# Patient Record
Sex: Male | Born: 1955 | Race: White | Hispanic: No | Marital: Married | Smoking: Former smoker
Health system: Southern US, Community
[De-identification: ages and names within clinical notes are randomized; demographics above are authoritative.]

## PROBLEM LIST (undated history)

## (undated) DIAGNOSIS — R569 Unspecified convulsions: Secondary | ICD-10-CM

## (undated) DIAGNOSIS — E1169 Type 2 diabetes mellitus with other specified complication: Secondary | ICD-10-CM

## (undated) DIAGNOSIS — I639 Cerebral infarction, unspecified: Secondary | ICD-10-CM

## (undated) DIAGNOSIS — C44300 Unspecified malignant neoplasm of skin of unspecified part of face: Secondary | ICD-10-CM

## (undated) DIAGNOSIS — K219 Gastro-esophageal reflux disease without esophagitis: Secondary | ICD-10-CM

## (undated) DIAGNOSIS — I152 Hypertension secondary to endocrine disorders: Secondary | ICD-10-CM

## (undated) DIAGNOSIS — E1159 Type 2 diabetes mellitus with other circulatory complications: Secondary | ICD-10-CM

## (undated) DIAGNOSIS — M952 Other acquired deformity of head: Secondary | ICD-10-CM

## (undated) DIAGNOSIS — N183 Chronic kidney disease, stage 3 unspecified: Secondary | ICD-10-CM

## (undated) DIAGNOSIS — K449 Diaphragmatic hernia without obstruction or gangrene: Secondary | ICD-10-CM

## (undated) DIAGNOSIS — Z9889 Other specified postprocedural states: Secondary | ICD-10-CM

## (undated) DIAGNOSIS — C61 Malignant neoplasm of prostate: Secondary | ICD-10-CM

## (undated) DIAGNOSIS — E119 Type 2 diabetes mellitus without complications: Secondary | ICD-10-CM

## (undated) HISTORY — PX: COLONOSCOPY: SHX174

## (undated) HISTORY — PX: HEMORRHOID SURGERY: SHX153

## (undated) HISTORY — DX: Unspecified convulsions: R56.9

## (undated) HISTORY — PX: SKIN GRAFT: SHX250

## (undated) HISTORY — PX: PROSTATE BIOPSY: SHX241

## (undated) HISTORY — DX: Cerebral infarction, unspecified: I63.9

---

## 1998-04-11 ENCOUNTER — Ambulatory Visit (HOSPITAL_COMMUNITY): Admission: RE | Admit: 1998-04-11 | Discharge: 1998-04-11 | Payer: Self-pay | Admitting: General Surgery

## 2002-08-08 HISTORY — PX: ESOPHAGOGASTRODUODENOSCOPY (EGD) WITH ESOPHAGEAL DILATION: SHX5812

## 2002-08-10 ENCOUNTER — Ambulatory Visit (HOSPITAL_COMMUNITY): Admission: RE | Admit: 2002-08-10 | Discharge: 2002-08-10 | Payer: Self-pay | Admitting: Internal Medicine

## 2003-11-04 ENCOUNTER — Other Ambulatory Visit: Admission: RE | Admit: 2003-11-04 | Discharge: 2003-11-04 | Payer: Self-pay | Admitting: Dermatology

## 2007-04-10 ENCOUNTER — Emergency Department (HOSPITAL_COMMUNITY): Admission: EM | Admit: 2007-04-10 | Discharge: 2007-04-11 | Payer: Self-pay | Admitting: Emergency Medicine

## 2007-04-10 IMAGING — CR DG CHEST 1V PORT
1 series · 1 of 1 positions shown · non-contrast
Comparison: None

CLINICAL DATA: Chest pain.

CHEST - 1 VIEW

[AP]
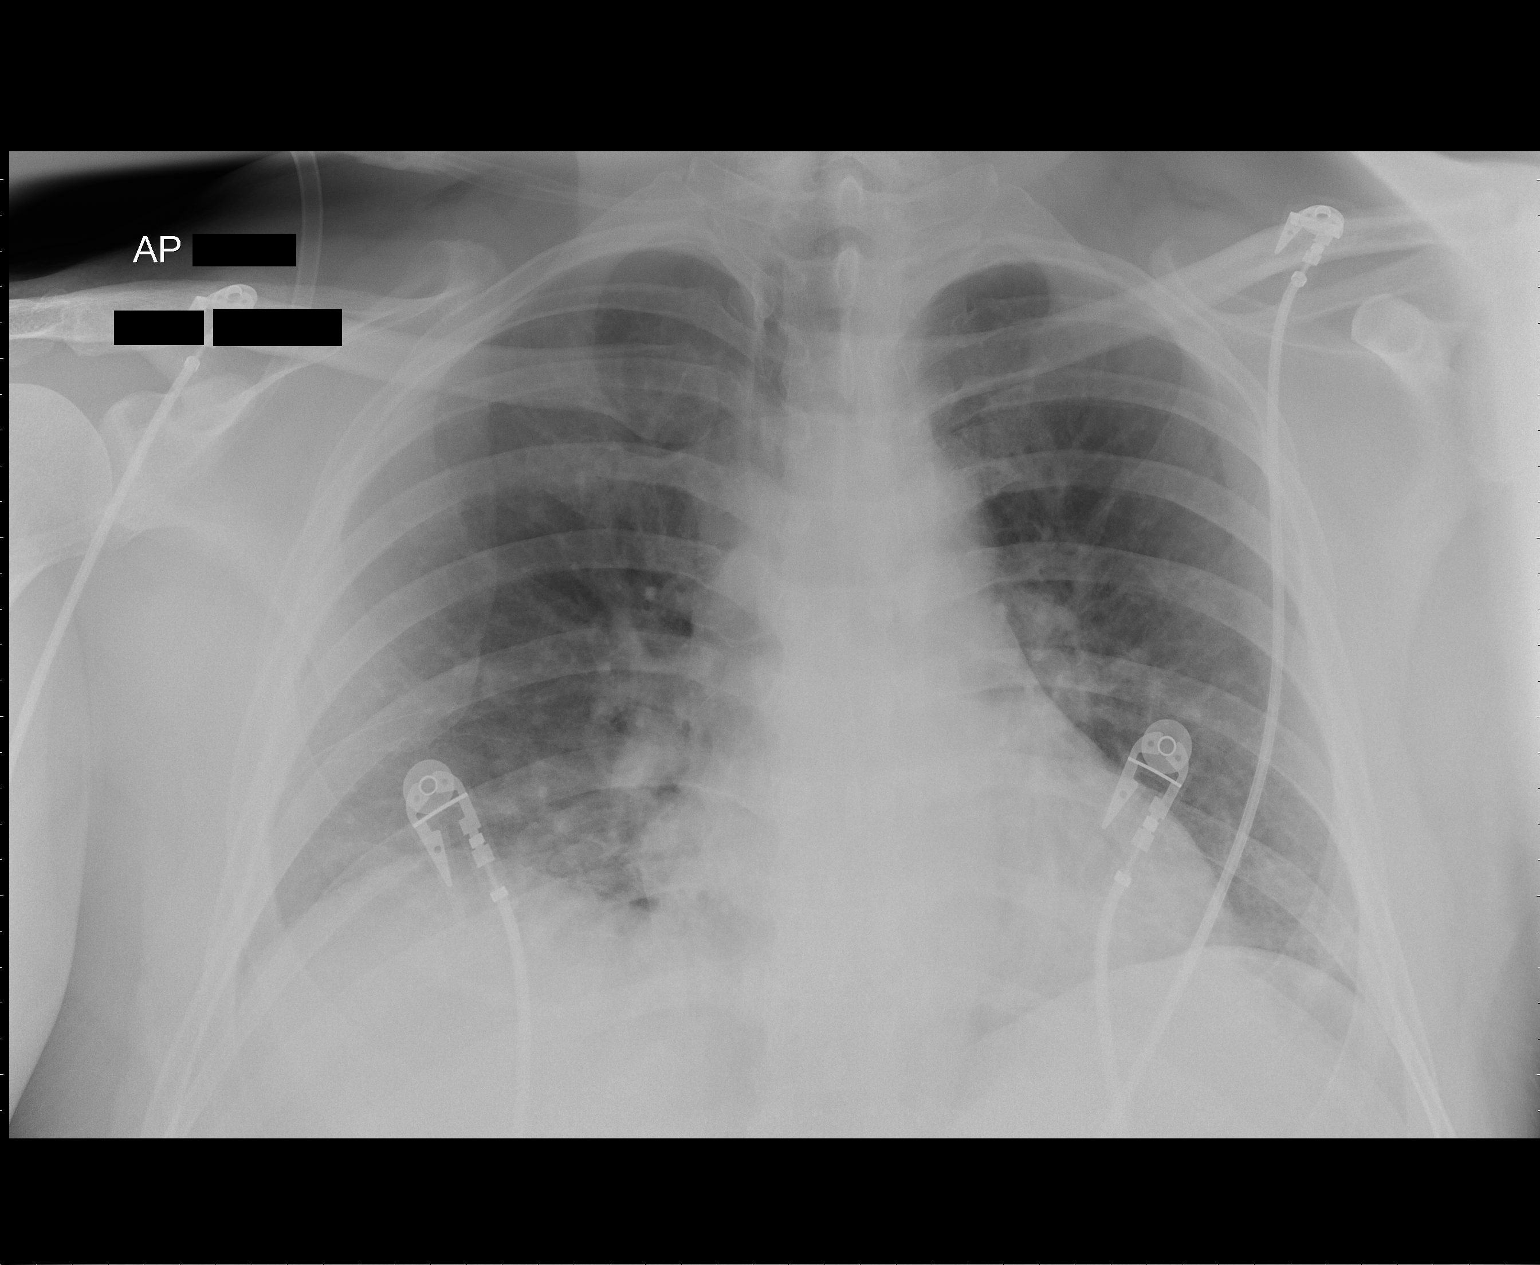

[1 of 1 positions shown; findings below may reference images not displayed]

FINDINGS: Midline trachea. Normal heart size and mediastinal contours. No
pleural effusion or pneumothorax. Lung volumes are diminished. This results in
pulmonary interstitial prominence. Patchy right greater than left basilar air
space opacities are likely atelectasis. No free intraperitoneal air.

IMPRESSION

1. Low lung volumes with patchy bibasilar opacities, likely atelectasis.

## 2007-04-11 IMAGING — US US ABDOMEN COMPLETE
1 series · 14 of 25 positions shown · non-contrast
Comparison: None

CLINICAL DATA: Abdominal pain, chest pain

ABDOMEN ULTRASOUND
TECHNIQUE: Complete abdominal ultrasound examination was performed including
evaluation of the liver, gallbladder, bile ducts, pancreas, kidneys, spleen,
IVC, and abdominal aorta.

[Series 1: unknown · 0.33mm/px · 14 of 54 slices shown]
[im 1/54]
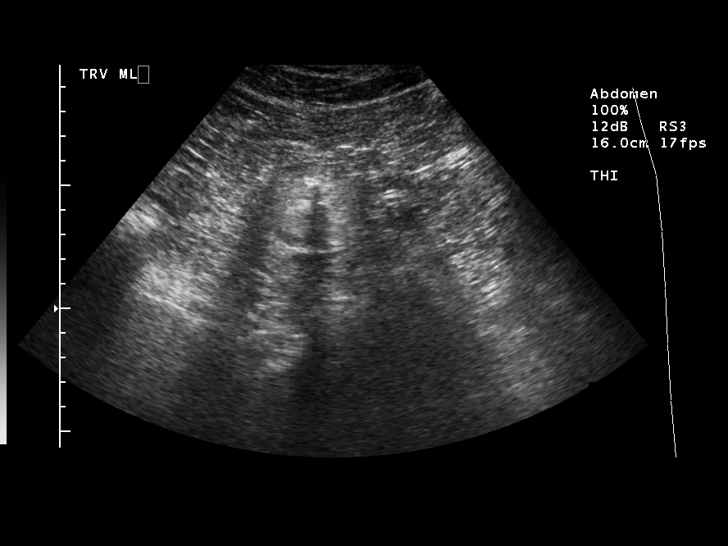
[im 5/54]
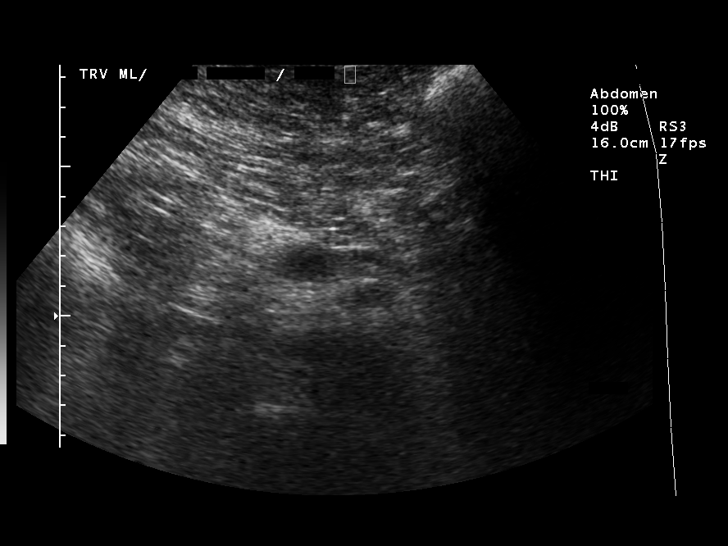
[im 9/54]
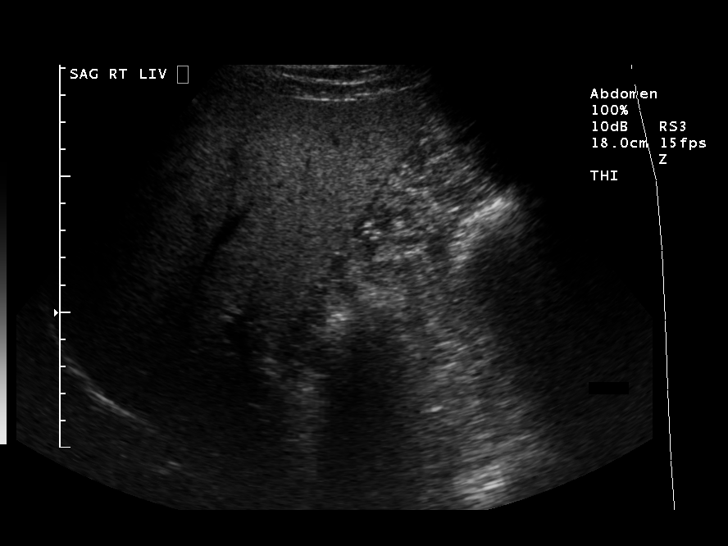
[im 14/54]
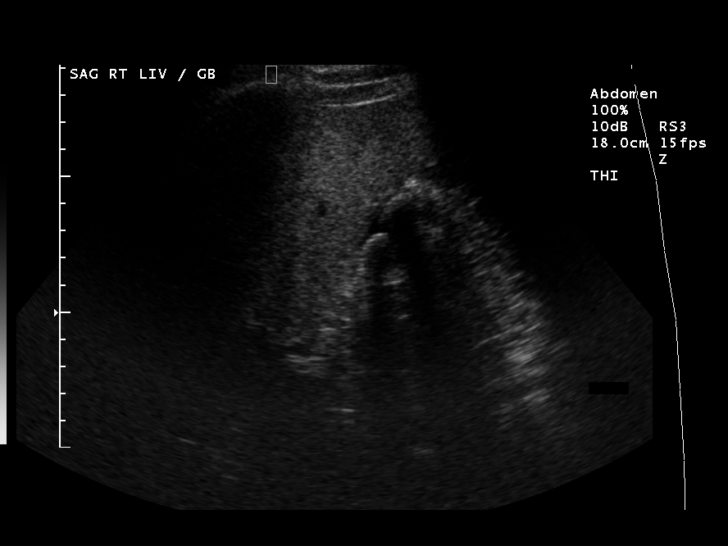
[im 18/54]
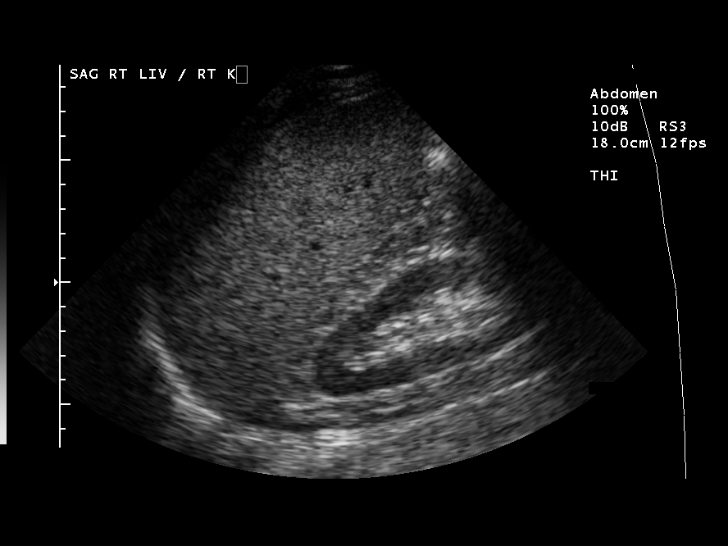
[im 20/54]
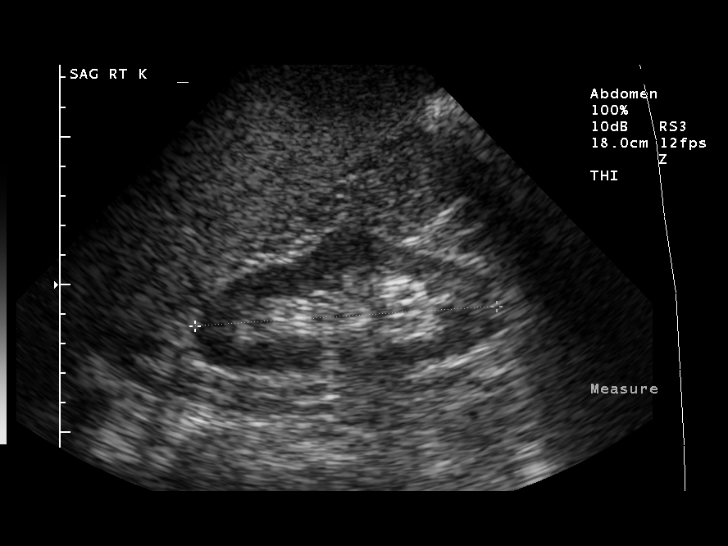
[im 25/54]
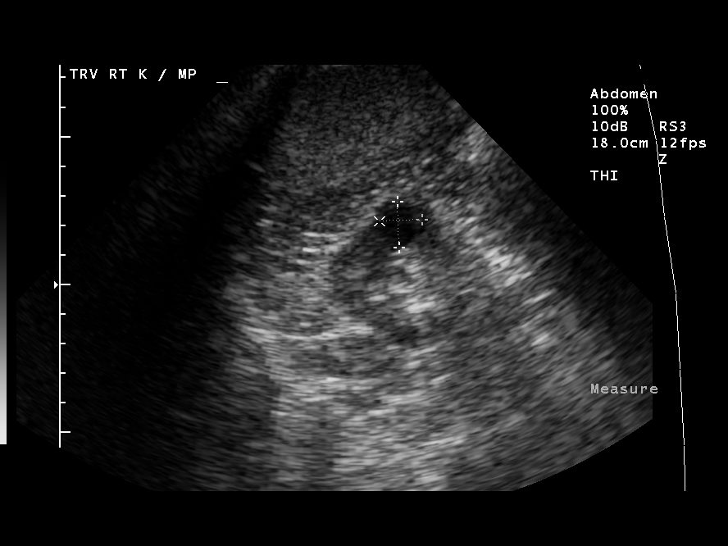
[im 29/54]
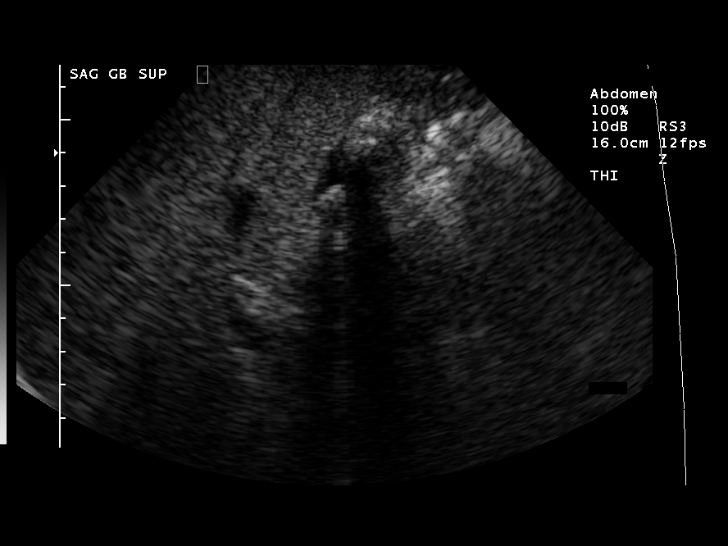
[im 34/54]
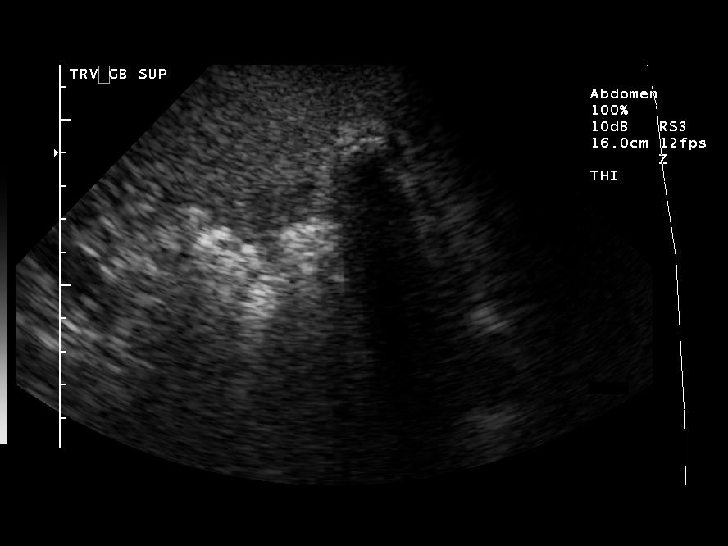
[im 36/54]
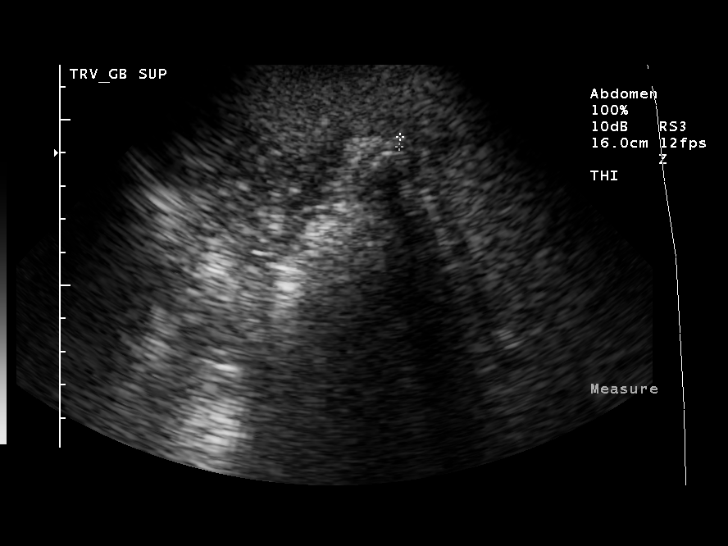
[im 40/54]
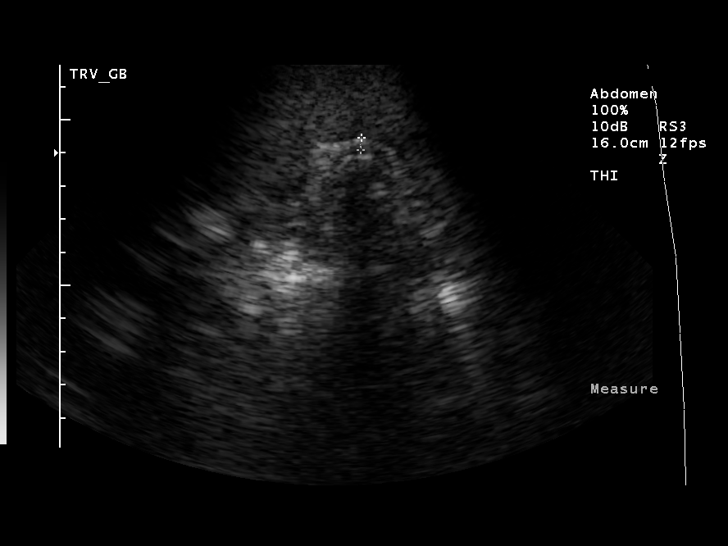
[im 45/54]
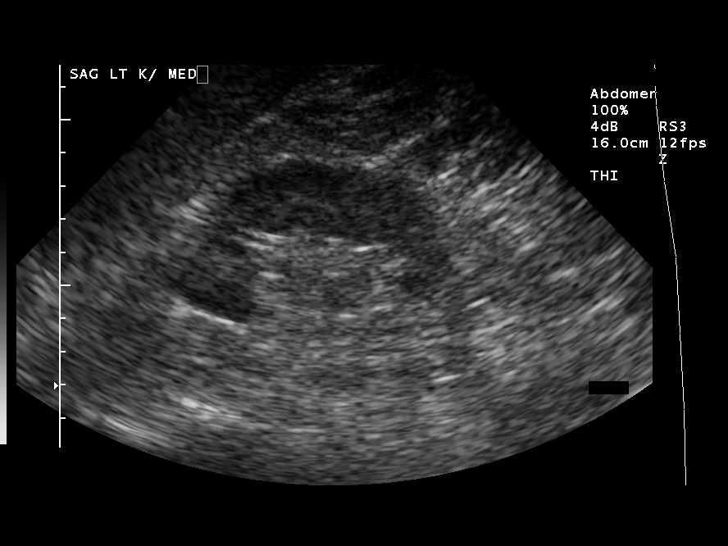
[im 49/54]
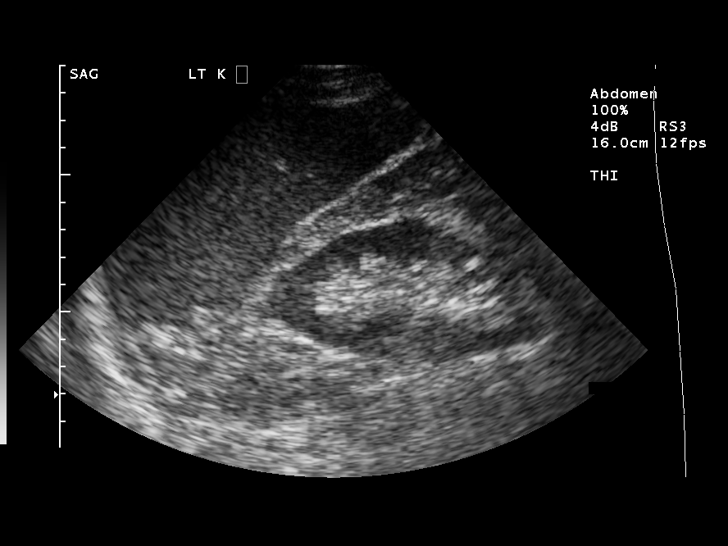
[im 54/54]
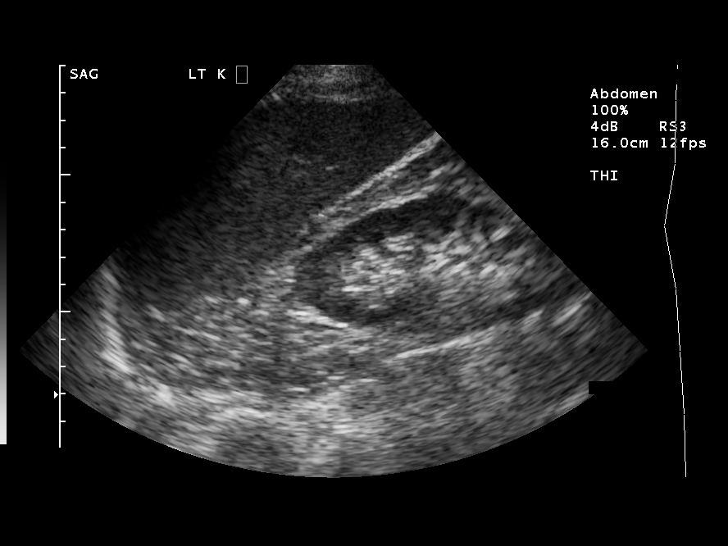

[14 of 25 positions shown; findings below may reference images not displayed]

FINDINGS: Gallbladder is contracted, containing multiple stones, measuring up to
1.3 cm. No sonographic Murphy's sign. Common bile duct normal at 4-5 mm.

Mild fatty infiltration of the liver noted. No focal lesions in the liver,
spleen, or left kidney. Visualized pancreas unremarkable although not well
visualized due to overlying bowel gas. Visualized IVC and abdominal aorta
unremarkable. Small benign appearing cyst in the midpole the right kidney.

IMPRESSION

Gallstones with contracted gallbladder.

Mild fatty infiltration of the liver.

## 2008-04-09 HISTORY — PX: LAPAROSCOPIC CHOLECYSTECTOMY: SUR755

## 2009-04-09 DIAGNOSIS — Z9889 Other specified postprocedural states: Secondary | ICD-10-CM

## 2009-04-09 DIAGNOSIS — M952 Other acquired deformity of head: Secondary | ICD-10-CM

## 2009-04-09 DIAGNOSIS — C44300 Unspecified malignant neoplasm of skin of unspecified part of face: Secondary | ICD-10-CM

## 2009-04-09 HISTORY — DX: Unspecified malignant neoplasm of skin of unspecified part of face: C44.300

## 2009-04-09 HISTORY — DX: Other specified postprocedural states: Z98.890

## 2009-04-09 HISTORY — DX: Other acquired deformity of head: M95.2

## 2010-08-25 NOTE — Consult Note (Signed)
NAME:  [name] NO.:  [id number]   MEDICAL RECORD NO.:  [id number]                  PATIENT TYPE:   LOCATION:                                       FACILITY:   PHYSICIAN:  Lionel December, M.D.                 DATE OF BIRTH:  March 29, 1956   DATE OF CONSULTATION:  07/24/2002  DATE OF DISCHARGE:                                   CONSULTATION   REASON FOR CONSULTATION:  Gastroesophageal reflux disease, needs EGD.   HISTORY OF PRESENT ILLNESS:  The patient is a 55 year old Caucasian  gentleman, patient of Dr. Dewaine Conger, who presents today for further evaluation  of chronic gastroesophageal reflux disease, which is poorly controlled.  Patient states that he has had acid reflux for more than 14 years.  He has  been on chronic TPI therapy at least for two years; however, over the last  one and one-half years, his acid reflux has been more and more difficult to  treat.  He says he has also gained more than 25 pounds in the last two  years.  He had stopped smoking for over seven years and then started back  within the last two years as well.  He complains of typical heartburn  symptoms.  He also feels like his food is sticking in his esophagus.  He is  having heartburn/indigestion, which awakens him at night.  At times, he has  pain in his esophageal region.  He complains of epigastric pain and  swelling.  Denies any melena, rectal bleeding, nausea, or vomiting.  He has  acid regurgitation.  His bowel movements vary; he will have anywhere from  one to two bowel movements daily or have four to five loose stools daily.  He has difficulty with postprandial loose stools, particularly when he has  bad indigestion.  Sometimes, he has fecal urgency.   CURRENT MEDICATIONS:  1. Prevacid 30 mg b.i.d.  2. Claritin daily.  3. Lexapro 6 mg daily.   ALLERGIES:  CODEINE, CIPRO, and Z-PAK.   PAST MEDICAL HISTORY:  1. Chronic gastroesophageal reflux disease, has never  had EGD.  2. Seasonal allergies.  3. Depression.  4. History of colonic polyps, diagnosed in 2000.  Patient is unsure when he     is due for another colonoscopy.  He had a hemorrhoid surgery at that time     as well for rectal bleeding.   FAMILY HISTORY:  Father has skin cancer; he is a diabetic and has heart  disease.  Mother has some type of lung disease.  He has a paternal uncle who  had throat cancer and one who died of metastatic cancer, primary unknown.  Denies any family history of colorectal cancer.   SOCIAL HISTORY:  He has been married for 29 years, has three children; two  of his sons have gastroesophageal reflux disease.  He is employed with  [employer].  He smokes 10 cigarettes daily.  He has been smoking  again for two years but had quit for about seven years previously;  previously, he was a heavy smoker at two packs per day.  Denies any alcohol  use.   REVIEW OF SYSTEMS:  GI:  Please see HPI for GI.  GENERAL AND  CARDIOPULMONARY:  He denies any shortness of breath, dyspnea on exertion, or  palpitations.   PHYSICAL EXAMINATION:  VITAL SIGNS:  Weight 201, height 5 feet 8 inches,  temp 97.8, blood pressure 120/90, pulse 78.  GENERAL:  A pleasant, well-nourished, well-developed Caucasian male in no  acute distress.  SKIN:  Warm and dry.  No jaundice.  HEENT:  Conjunctivae are pink.  Sclerae are not icteric.  Oropharyngeal  mucosa moist and pink; no lesions, erythema, or exudate.  No  lymphadenopathy, thyromegaly.  CHEST:  Lungs are clear to auscultation.  CARDIAC EXAM:  Reveals regular rate and rhythm.  Normal S1 and S2.  No  murmurs, rubs, or gallops.  ABDOMEN:  Positive bowel sounds.  Soft, nondistended.  He has mild  epigastric tenderness to palpation.  No organomegaly or masses.  EXTREMITIES:  No edema.   IMPRESSION:  The patient is a 55 year old gentleman with gastroesophageal  reflux disease for more than 14 years.  He has been having difficulty   controlling his symptoms over the last couple of years, which may be in part  due to his weight gain as well as his tobacco cigarette use.  His symptoms  are typical of gastroesophageal reflux disease.  Given that he has had  chronic gastroesophageal reflux disease and is having poorly controlled  symptoms, I do recommend esophagogastroduodenoscopy at this time.  In  addition, he complains of dysphagia.  He may have an esophageal stricture.  Epigastric pain may be due to gastroesophageal reflux disease.  Complains of  postprandial loose stools; I suspect that he may have irritable bowel  syndrome.   PLAN:  1. EGD and possible dilatation in the near future.  2. Stop Prevacid, begin Aciphex 20 mg p.o. b.i.d., number three weeks of     samples provided.  3. Discussed antireflux measures and pamphlet provided.  4. Encouraged him to take Fiber Choice two tablets daily and/or Imodium 2 mg     as needed for loose stools.  At this time, would like to avoid     antispasmodics, as this may make his reflux worse.   I would like to thank Dr. Dewaine Conger for allowing [place] to take part in the care of  this patient.     Tana Coast, P.A.                        Lionel December, M.D.    LL/MEDQ  D:  07/24/2002  T:  07/24/2002  Job:  [id number]   cc:   Colon Flattery, MD  [address]  [place]  [place] [address]  Fax: [phone]   Tommye Standard, M.D.  [address] - Resident  [place], [place] [address]

## 2010-08-25 NOTE — Op Note (Signed)
NAME:  [name], [name]                           ACCOUNT NO.:  [id number]   MEDICAL RECORD NO.:  [id number]                   PATIENT TYPE:  AMB   LOCATION:  DAY                                  FACILITY:  [facility]   PHYSICIAN:  Lionel December, M.D.                 DATE OF BIRTH:  02-19-1956   DATE OF PROCEDURE:  08/10/2002  DATE OF DISCHARGE:                                 OPERATIVE REPORT   PROCEDURE:  Esophagogastroduodenoscopy with esophageal dilatation.   INDICATIONS:  The patient is a 55 year old Caucasian male with a 14-year  history of symptoms of heartburn, who has been on a PPI with poor control of  his symptoms.  However, he may not be very compliant with dietary measures.  He was switched from Prevacid to Aciphex 20 mg b.i.d. and symptomatically  feels better.  He is undergoing a diagnostic and therapeutic procedure.  The  procedure was reviewed with the patient and informed consent was obtained.   PREMEDICATION:  Cetacaine spray for pharyngeal topical anesthesia, Demerol  50 mg IV, Versed 9 mg IV in divided dose.   INSTRUMENT USED:  Olympus video system.   FINDINGS:  Procedure performed in endoscopy suite.  The patient's vital  signs and O2 saturation were monitored during procedure and remained stable.  The patient was placed in the left lateral recumbent position and the  endoscope was passed via oropharynx without any difficulty into esophagus.   Esophagus:  Mucosa of the esophagus normal throughout.  The squamocolumnar  junction is somewhat irregular.  There was inconspicuous incomplete ring.  There was a small sliding hiatal hernia, no more than 3 or 4 cm in length.  The diaphragmatic hiatus was wide open.   Stomach:  It was empty and distended very well with insufflation.  Folds of  proximal stomach were normal.  Examination of the mucosa revealed patchy  erythema and granularity at the body and antrum.  The pyloric channel was  patent.  Angularis, fundus, and  cardia examined by retroflexing the scope  and were normal.   Duodenum:  Examination of the bulb and postbulbar duodenum was normal.  The  endoscope was withdrawn.  The esophagus was dilated by passing a 56 [place]  Maloney dilator through esophagus completely.  After dilatation was  complete, endoscope was passed and there was no mucosal injury noted to the  esophagus.  The patient tolerated the procedure well.   FINAL DIAGNOSES:  1. No evidence of Barrett's esophagus or erosive esophagitis.  Small sliding     hiatal hernia.  2. Incomplete/inconspicuous ring.  Esophagus was dilated by passing 56     [place] Maloney dilator.  3. Nonerosive gastritis.   RECOMMENDATIONS:  1. Antireflux measures stressed with the patient.  2. Would like for him to stay on Aciphex 20 mg p.o. b.i.d. for eight more     weeks and thereafter back to  20 mg p.o. q.a.m.  3. H. pylori serology will be checked.  4. He will return for OV in two months from now.  5. Unless his symptom control is felt to be excellent, he may be a candidate     for antireflux surgery.                                               Lionel December, M.D.    NR/MEDQ  D:  08/10/2002  T:  08/10/2002  Job:  [id number]   cc:   Colon Flattery, MD  [address]  [place]  [place] [address]  Fax: [phone]

## 2010-12-28 LAB — DIFFERENTIAL
Basophils Absolute: 0
Basophils Relative: 0
Eosinophils Absolute: 0.1
Eosinophils Relative: 1
Lymphocytes Relative: 30
Lymphs Abs: 2.2
Monocytes Absolute: 0.7
Monocytes Relative: 10
Neutro Abs: 4.3
Neutrophils Relative %: 58

## 2010-12-28 LAB — URINALYSIS, ROUTINE W REFLEX MICROSCOPIC
Bilirubin Urine: NEGATIVE
Glucose, UA: NEGATIVE
Hgb urine dipstick: NEGATIVE
Ketones, ur: NEGATIVE
Nitrite: NEGATIVE
Protein, ur: NEGATIVE
Specific Gravity, Urine: 1.026
Urobilinogen, UA: 0.2
pH: 6

## 2010-12-28 LAB — COMPREHENSIVE METABOLIC PANEL
ALT: 32
Alkaline Phosphatase: 71
CO2: 25
GFR calc non Af Amer: >60
Glucose, Bld: 171 — ABNORMAL HIGH
Potassium: 3.6
Sodium: 136

## 2010-12-28 LAB — LIPASE, BLOOD: Lipase: 29

## 2010-12-28 LAB — CBC
RBC: 4.87
WBC: 7.4

## 2010-12-28 LAB — POCT CARDIAC MARKERS
CKMB, poc: 1.1
Myoglobin, poc: 71.1
Operator id: 133351
Troponin i, poc: <0.05

## 2015-11-24 ENCOUNTER — Other Ambulatory Visit: Payer: Self-pay | Admitting: General Surgery

## 2015-12-05 NOTE — Pre-Procedure Instructions (Signed)
[name]  12/05/2015      [id number] - [place], [place] [address] Phone: [phone] Fax: [phone]    Your procedure is scheduled on Wednesday August 30.  Report to [facility] Admitting at 10:30 A.M.  Call this number if you have problems the morning of surgery:  [phone]   Remember:  Do not eat food or drink liquids after midnight.  Take these medicines the morning of surgery with A SIP OF WATER: pantoprazole (protonix)  7 days prior to surgery STOP taking any Aspirin, Aleve, Naproxen, Ibuprofen, Motrin, Advil, Goody's, BC's, all herbal medications, fish oil, and all vitamins    Do not wear jewelry, make-up or nail polish.  Do not wear lotions, powders, or perfumes, or deoderant.  Do not shave 48 hours prior to surgery.  Men may shave face and neck.  Do not bring valuables to the hospital.  [facility] is not responsible for any belongings or valuables.  Contacts, dentures or bridgework may not be worn into surgery.  Leave your suitcase in the car.  After surgery it may be brought to your room.  For patients admitted to the hospital, discharge time will be determined by your treatment team.  Patients discharged the day of surgery will not be allowed to drive home.    Special instructions:    [facility]- Preparing For Surgery  Before surgery, you can play an important role. Because skin is not sterile, your skin needs to be as free of germs as possible. You can reduce the number of germs on your skin by washing with CHG (chlorahexidine gluconate) Soap before surgery.  CHG is an antiseptic cleaner which kills germs and bonds with the skin to continue killing germs even after washing.  Please do not use if you have an allergy to CHG or antibacterial soaps. If your skin becomes reddened/irritated stop using the CHG.  Do not shave (including legs and underarms) for at least 48 hours  prior to first CHG shower. It is OK to shave your face.  Please follow these instructions carefully.   1. Shower the NIGHT BEFORE SURGERY and the MORNING OF SURGERY with CHG.   2. If you chose to wash your hair, wash your hair first as usual with your normal shampoo.  3. After you shampoo, rinse your hair and body thoroughly to remove the shampoo.  4. Use CHG as you would any other liquid soap. You can apply CHG directly to the skin and wash gently with a scrungie or a clean washcloth.   5. Apply the CHG Soap to your body ONLY FROM THE NECK DOWN.  Do not use on open wounds or open sores. Avoid contact with your eyes, ears, mouth and genitals (private parts). Wash genitals (private parts) with your normal soap.  6. Wash thoroughly, paying special attention to the area where your surgery will be performed.  7. Thoroughly rinse your body with warm water from the neck down.  8. DO NOT shower/wash with your normal soap after using and rinsing off the CHG Soap.  9. Pat yourself dry with a CLEAN TOWEL.   10. Wear CLEAN PAJAMAS   11. Place CLEAN SHEETS on your bed the night of your first shower and DO NOT SLEEP WITH PETS.    Day of Surgery: Do not apply any deodorants/lotions. Please wear clean clothes to the hospital/surgery center.

## 2015-12-05 NOTE — H&P (Signed)
[name] Location: [facility] Patient #: [id number] DOB: 07/31/1955 Married / Language: English / Race: White Male      History of Present Illness  The patient is a 60 year old male who presents with an incisional hernia. This is [name] who lives in [name]. He is referred by Dr. Octavio Haney at [name] for evaluation of a periumbilical incisional hernia.  He underwent laparoscopic cholecystectomy by Dr. Anthony Haney 7 years ago. Went well. Noticed a bulge shortly thereafter which has been getting larger and more painful. Now wants to have something done. No history of incarceration or obstruction.  The wife states that he had a PSA test which was 5.9. She states that Dr. Melina Haney wanted my advice. I told her this was of uncertain significance but he will be referred to [facility] for a professional opinion.  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 No recurrence. Treated for GERD. Because of chest pain had cardiac workup at [name] which he says was negative but they still give him a bottle of nitroglycerin. Has had hemorrhoid surgery  Married. Former smoker. 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  He would like to have this repaired. He is going to be scheduled for laparoscopic repair of his incisional hernia with mesh, possible open repair. He is aware of the temporary disabilities and we went over that in detail. out of work for 2 weeks and no heavy lifting for 6 weeks. I discussed the indications, details, techniques, and numerous risk of the surgery with the patient and his wife. He is aware of the risk of bleeding, infection, conversion to open laparotomy, recurrence of the hernia, injury to adjacent organs with major  reconstructive surgery or infection, cardiac pulmonary and thromboembolic problems. He understands these issues well. This time all of his questions were answered. He agrees with this plan.   Other Problems  Gastroesophageal Reflux Disease Hemorrhoids Migraine Headache  Past Surgical History  Gallbladder Surgery - Laparoscopic Hemorrhoidectomy  Diagnostic Studies History  Colonoscopy 1-5 years ago  Allergies  Codeine Phosphate *ANALGESICS - OPIOID* Cipro *FLUOROQUINOLONES* Azithromycin *MACROLIDES*  Medication History ) Pantoprazole Sodium (40MG  Tablet DR, Oral) Active. Medications Reconciled  Social History  Caffeine use Carbonated beverages, Coffee, Tea. No alcohol use No drug use Tobacco use Former smoker.  Family History  Colon Cancer Mother. Diabetes Mellitus Father. Hypertension Father.    Review of Systems  General Not Present- Appetite Loss, Chills, Fatigue, Fever, Night Sweats, Weight Gain and Weight Loss. Skin Not Present- Change in Wart/Mole, Dryness, Hives, Jaundice, New Lesions, Non-Healing Wounds, Rash and Ulcer. HEENT Not Present- Earache, Hearing Loss, Hoarseness, Nose Bleed, Oral Ulcers, Ringing in the Ears, Seasonal Allergies, Sinus Pain, Sore Throat, Visual Disturbances, Wears glasses/contact lenses and Yellow Eyes. Respiratory Present- Snoring. Not Present- Bloody sputum, Chronic Cough, Difficulty Breathing and Wheezing. Breast Not Present- Breast Mass, Breast Pain, Nipple Discharge and Skin Changes. Cardiovascular Not Present- Chest Pain, Difficulty Breathing Lying Down, Leg Cramps, Palpitations, Rapid Heart Rate, Shortness of Breath and Swelling of Extremities. Gastrointestinal Present- Indigestion. Not Present- Abdominal Pain, Bloating, Bloody Stool, Change in Bowel Habits, Chronic diarrhea, Constipation, Difficulty Swallowing, Excessive gas, Gets full quickly at meals, Hemorrhoids, Nausea, Rectal Pain and Vomiting. Male  Genitourinary Not Present- Blood in Urine, Change in Urinary Stream, Frequency, Impotence, Nocturia, Painful Urination, Urgency and Urine  Leakage. Musculoskeletal Not Present- Back Pain, Joint Pain, Joint Stiffness, Muscle Pain, Muscle Weakness and Swelling of Extremities. Neurological Not Present- Decreased Memory, Fainting, Headaches, Numbness, Seizures, Tingling, Tremor, Trouble walking and Weakness. Psychiatric Not Present- Anxiety, Bipolar, Change in Sleep Pattern, Depression, Fearful and Frequent crying. Endocrine Not Present- Cold Intolerance, Excessive Hunger, Hair Changes, Heat Intolerance, Hot flashes and New Diabetes. Hematology Not Present- Blood Thinners, Easy Bruising, Excessive bleeding, Gland problems, HIV and Persistent Infections.  Vitals  Weight: 214 lb Height: 67in Body Surface Area: 2.08 m Body Mass Index: 33.52 kg/m  Temp.: 97.73F(Temporal)  Pulse: 58 (Regular)  BP: 126/82 (Sitting, Left Arm, Standard)       Physical Exam  General Mental Status-Alert. General Appearance-Consistent with stated age. Hydration-Well hydrated. Voice-Normal.  Head and Neck Head-normocephalic, atraumatic with no lesions or palpable masses. Trachea-midline. Thyroid Gland Characteristics - normal size and consistency.  Eye Eyeball - Bilateral-Extraocular movements intact. Sclera/Conjunctiva - Bilateral-No scleral icterus.  Chest and Lung Exam Chest and lung exam reveals -quiet, even and easy respiratory effort with no use of accessory muscles and on auscultation, normal breath sounds, no adventitious sounds and normal vocal resonance. Inspection Chest Wall - Normal. Back - normal.  Breast Breast - Left-Symmetric, Non Tender, No Biopsy scars, no Dimpling, No Inflammation, No Lumpectomy scars, No Mastectomy scars, No Peau d' Orange. Breast - Right-Symmetric, Non Tender, No Biopsy scars, no Dimpling, No Inflammation, No Lumpectomy scars, No  Mastectomy scars, No Peau d' Orange. Breast Lump-No Palpable Breast Mass.  Cardiovascular Cardiovascular examination reveals -normal heart sounds, regular rate and rhythm with no murmurs and normal pedal pulses bilaterally.  Abdomen Note: Mild obesity. Examined supine and standing. No organomegaly. Laparoscopic scars including one in the umbilicus. He has a 3 cm bulge of the umbilicus that is reducible. He does not have inguinal hernias.   Male Genitourinary Note: Negative for inguinal hernia bilaterally   Neurologic Neurologic evaluation reveals -alert and oriented x 3 with no impairment of recent or remote memory. Mental Status-Normal.  Musculoskeletal Normal Exam - Left-Upper Extremity Strength Normal and Lower Extremity Strength Normal. Normal Exam - Right-Upper Extremity Strength Normal and Lower Extremity Strength Normal.  Lymphatic Head & Neck  General Head & Neck Lymphatics: Bilateral - Description - Normal. Axillary  General Axillary Region: Bilateral - Description - Normal. Tenderness - Non Tender. Femoral & Inguinal  Generalized Femoral & Inguinal Lymphatics: Bilateral - Description - Normal. Tenderness - Non Tender.    Assessment & Plan  INCISIONAL HERNIA, WITHOUT OBSTRUCTION OR GANGRENE (K43.2)    You have an incisional hernia at your umbilicus. This is almost certainly due to your previous gallbladder surgery This has been enlarging and becoming more painful Your symptoms will continue to progress until something is done you will be scheduled for laparoscopic repair of your incisional hernia with mesh, possible open repair We have discussed the indications, techniques, and numerous risks of this surgery in detail Please read the printed information that I gave you.  The elevated PSA level of 5.9 is of questionable significance. The correct way to have this evaluated is by a urologist who can examine your prostate carefully I will refer you  to [facility] for a proper evaluation  ELEVATED PSA, LESS THAN 10 NG/ML (R97.20) FORMER SMOKER [id number]) HISTORY OF SKIN CANCER IN ADULTHOOD [id number])    Edsel Petrin. Dalbert Batman, M.D., [facility] General and Minimally invasive Surgery Breast and Colorectal Surgery Office:   [phone] Pager:   [phone]

## 2015-12-06 ENCOUNTER — Encounter (HOSPITAL_COMMUNITY)
Admission: RE | Admit: 2015-12-06 | Discharge: 2015-12-06 | Disposition: A | Discharge status: Home / Self Care | Payer: Managed Care, Other (non HMO) | Source: Ambulatory Visit | Attending: General Surgery | Admitting: General Surgery

## 2015-12-06 ENCOUNTER — Encounter (HOSPITAL_COMMUNITY): Payer: Self-pay

## 2015-12-06 DIAGNOSIS — E669 Obesity, unspecified: Secondary | ICD-10-CM | POA: Diagnosis not present

## 2015-12-06 DIAGNOSIS — Z87891 Personal history of nicotine dependence: Secondary | ICD-10-CM | POA: Diagnosis not present

## 2015-12-06 DIAGNOSIS — Z85828 Personal history of other malignant neoplasm of skin: Secondary | ICD-10-CM | POA: Diagnosis not present

## 2015-12-06 DIAGNOSIS — Z6834 Body mass index (BMI) 34.0-34.9, adult: Secondary | ICD-10-CM | POA: Diagnosis not present

## 2015-12-06 DIAGNOSIS — K219 Gastro-esophageal reflux disease without esophagitis: Secondary | ICD-10-CM | POA: Diagnosis not present

## 2015-12-06 DIAGNOSIS — K430 Incisional hernia with obstruction, without gangrene: Secondary | ICD-10-CM | POA: Diagnosis not present

## 2015-12-06 DIAGNOSIS — Z923 Personal history of irradiation: Secondary | ICD-10-CM | POA: Diagnosis not present

## 2015-12-06 HISTORY — DX: Gastro-esophageal reflux disease without esophagitis: K21.9

## 2015-12-06 LAB — CBC WITH DIFFERENTIAL/PLATELET
Basophils Absolute: 0 10*3/uL (ref 0.0–0.1)
Basophils Relative: 0 %
EOS PCT: 2 %
Eosinophils Absolute: 0.1 10*3/uL (ref 0.0–0.7)
HEMATOCRIT: 45 % (ref 39.0–52.0)
Hemoglobin: 15.3 g/dL (ref 13.0–17.0)
LYMPHS ABS: 1.4 10*3/uL (ref 0.7–4.0)
LYMPHS PCT: 27 %
MCH: 29.9 pg (ref 26.0–34.0)
MCHC: 34 g/dL (ref 30.0–36.0)
MCV: 87.9 fL (ref 78.0–100.0)
MONO ABS: 0.4 10*3/uL (ref 0.1–1.0)
Monocytes Relative: 9 %
NEUTROS ABS: 3.1 10*3/uL (ref 1.7–7.7)
Neutrophils Relative %: 62 %
PLATELETS: 207 10*3/uL (ref 150–400)
RBC: 5.12 MIL/uL (ref 4.22–5.81)
RDW: 13.6 % (ref 11.5–15.5)
WBC: 5 10*3/uL (ref 4.0–10.5)

## 2015-12-06 LAB — COMPREHENSIVE METABOLIC PANEL
ALK PHOS: 61 U/L (ref 38–126)
ALT: 32 U/L (ref 17–63)
AST: 21 U/L (ref 15–41)
Albumin: 4.1 g/dL (ref 3.5–5.0)
Anion gap: 6 (ref 5–15)
BILIRUBIN TOTAL: 0.5 mg/dL (ref 0.3–1.2)
BUN: 9 mg/dL (ref 6–20)
CALCIUM: 9.6 mg/dL (ref 8.9–10.3)
CHLORIDE: 107 mmol/L (ref 101–111)
CO2: 27 mmol/L (ref 22–32)
CREATININE: 1.23 mg/dL (ref 0.61–1.24)
Glucose, Bld: 134 mg/dL — ABNORMAL HIGH (ref 65–99)
Potassium: 4.3 mmol/L (ref 3.5–5.1)
Sodium: 140 mmol/L (ref 135–145)
TOTAL PROTEIN: 6.9 g/dL (ref 6.5–8.1)

## 2015-12-06 NOTE — Progress Notes (Signed)
PCP: [name], [facility], last office visit and EKG requested.  No cardiologist Pt was seen ~2 years ago at [facility] d/t chest pain/heart attack workup. Pt states everything was normal and he has not had any issues since. Stress test requested from [place] denies Echo or cath.   Pt with no complaints today, states he had an EKG done last month. Order for DOS EKG if EKG not received from MD.

## 2015-12-07 ENCOUNTER — Ambulatory Visit (HOSPITAL_COMMUNITY)
Admission: RE | Admit: 2015-12-07 | Discharge: 2015-12-08 | Disposition: A | Discharge status: Home / Self Care | Payer: Managed Care, Other (non HMO) | Source: Ambulatory Visit | Attending: General Surgery | Admitting: General Surgery

## 2015-12-07 ENCOUNTER — Ambulatory Visit (HOSPITAL_COMMUNITY): Payer: Managed Care, Other (non HMO) | Admitting: Certified Registered"

## 2015-12-07 ENCOUNTER — Encounter (HOSPITAL_COMMUNITY): Payer: Self-pay | Admitting: Surgery

## 2015-12-07 ENCOUNTER — Encounter (HOSPITAL_COMMUNITY)
Admission: RE | Disposition: A | Discharge status: Home / Self Care | Payer: Self-pay | Source: Ambulatory Visit | Attending: General Surgery

## 2015-12-07 DIAGNOSIS — Z923 Personal history of irradiation: Secondary | ICD-10-CM | POA: Insufficient documentation

## 2015-12-07 DIAGNOSIS — Z87891 Personal history of nicotine dependence: Secondary | ICD-10-CM | POA: Insufficient documentation

## 2015-12-07 DIAGNOSIS — K219 Gastro-esophageal reflux disease without esophagitis: Secondary | ICD-10-CM | POA: Insufficient documentation

## 2015-12-07 DIAGNOSIS — K430 Incisional hernia with obstruction, without gangrene: Secondary | ICD-10-CM | POA: Diagnosis not present

## 2015-12-07 DIAGNOSIS — K432 Incisional hernia without obstruction or gangrene: Secondary | ICD-10-CM | POA: Diagnosis present

## 2015-12-07 DIAGNOSIS — E669 Obesity, unspecified: Secondary | ICD-10-CM | POA: Insufficient documentation

## 2015-12-07 DIAGNOSIS — Z6834 Body mass index (BMI) 34.0-34.9, adult: Secondary | ICD-10-CM | POA: Insufficient documentation

## 2015-12-07 DIAGNOSIS — Z85828 Personal history of other malignant neoplasm of skin: Secondary | ICD-10-CM | POA: Insufficient documentation

## 2015-12-07 HISTORY — PX: INCISIONAL HERNIA REPAIR: SHX193

## 2015-12-07 LAB — CBC
HEMATOCRIT: 44.8 % (ref 39.0–52.0)
Hemoglobin: 15.3 g/dL (ref 13.0–17.0)
MCH: 30 pg (ref 26.0–34.0)
MCHC: 34.2 g/dL (ref 30.0–36.0)
MCV: 87.8 fL (ref 78.0–100.0)
PLATELETS: 203 10*3/uL (ref 150–400)
RBC: 5.1 MIL/uL (ref 4.22–5.81)
RDW: 13.6 % (ref 11.5–15.5)
WBC: 8.9 10*3/uL (ref 4.0–10.5)

## 2015-12-07 LAB — CREATININE, SERUM
CREATININE: 1.44 mg/dL — AB (ref 0.61–1.24)
GFR, EST AFRICAN AMERICAN: 60 mL/min — AB (ref >60)
GFR, EST NON AFRICAN AMERICAN: 51 mL/min — AB (ref >60)

## 2015-12-07 SURGERY — REPAIR, HERNIA, INCISIONAL, LAPAROSCOPIC
Anesthesia: General | Site: Abdomen

## 2015-12-07 MED ORDER — 0.9 % SODIUM CHLORIDE (POUR BTL) OPTIME
TOPICAL | Status: DC | PRN
  Administered 2015-12-07: 1000 mL

## 2015-12-07 MED ORDER — ONDANSETRON HCL 4 MG/2ML IJ SOLN
INTRAMUSCULAR | Status: DC | PRN
  Administered 2015-12-07: 4 mg via INTRAVENOUS

## 2015-12-07 MED ORDER — LIDOCAINE HCL (CARDIAC) 20 MG/ML IV SOLN
INTRAVENOUS | Status: DC | PRN
Start: 2015-12-07 — End: 2015-12-07
  Administered 2015-12-07: 40 mg via INTRAVENOUS

## 2015-12-07 MED ORDER — LACTATED RINGERS IV SOLN
INTRAVENOUS | Status: DC
  Administered 2015-12-07 (×2): via INTRAVENOUS

## 2015-12-07 MED ORDER — CEFAZOLIN SODIUM-DEXTROSE 2-4 GM/100ML-% IV SOLN
2.0000 g | Freq: Three times a day (TID) | INTRAVENOUS | Status: AC
  Administered 2015-12-07: 2 g via INTRAVENOUS
  Filled 2015-12-07: qty 100

## 2015-12-07 MED ORDER — FENTANYL CITRATE (PF) 100 MCG/2ML IJ SOLN
INTRAMUSCULAR | Status: AC
  Filled 2015-12-07: qty 2

## 2015-12-07 MED ORDER — PROMETHAZINE HCL 25 MG/ML IJ SOLN: 6.2500 mg | INTRAMUSCULAR | Status: DC | PRN

## 2015-12-07 MED ORDER — CEFAZOLIN SODIUM-DEXTROSE 2-4 GM/100ML-% IV SOLN
2.0000 g | INTRAVENOUS | Status: AC
  Administered 2015-12-07: 2 g via INTRAVENOUS
  Filled 2015-12-07: qty 100

## 2015-12-07 MED ORDER — ONDANSETRON 4 MG PO TBDP: 4.0000 mg | ORAL_TABLET | Freq: Four times a day (QID) | ORAL | Status: DC | PRN

## 2015-12-07 MED ORDER — ENOXAPARIN SODIUM 40 MG/0.4ML ~~LOC~~ SOLN: 40.0000 mg | SUBCUTANEOUS | Status: DC

## 2015-12-07 MED ORDER — SENNA 8.6 MG PO TABS
1.0000 | ORAL_TABLET | Freq: Two times a day (BID) | ORAL | Status: DC
  Administered 2015-12-07: 8.6 mg via ORAL
  Filled 2015-12-07: qty 1

## 2015-12-07 MED ORDER — BUPIVACAINE-EPINEPHRINE 0.25% -1:200000 IJ SOLN
INTRAMUSCULAR | Status: DC | PRN
Start: 2015-12-07 — End: 2015-12-07
  Administered 2015-12-07: 8 mL

## 2015-12-07 MED ORDER — DEXAMETHASONE SODIUM PHOSPHATE 10 MG/ML IJ SOLN
INTRAMUSCULAR | Status: AC
  Filled 2015-12-07: qty 1

## 2015-12-07 MED ORDER — PHENYLEPHRINE HCL 10 MG/ML IJ SOLN
INTRAMUSCULAR | Status: DC | PRN
  Administered 2015-12-07 (×2): 80 ug via INTRAVENOUS

## 2015-12-07 MED ORDER — ONDANSETRON HCL 4 MG/2ML IJ SOLN
INTRAMUSCULAR | Status: AC
  Filled 2015-12-07: qty 2

## 2015-12-07 MED ORDER — HYDROMORPHONE HCL 1 MG/ML IJ SOLN
INTRAMUSCULAR | Status: DC | PRN
  Administered 2015-12-07: 1 mg via INTRAVENOUS

## 2015-12-07 MED ORDER — PROPOFOL 10 MG/ML IV BOLUS
INTRAVENOUS | Status: AC
  Filled 2015-12-07: qty 20

## 2015-12-07 MED ORDER — CHLORHEXIDINE GLUCONATE CLOTH 2 % EX PADS: 6.0000 | MEDICATED_PAD | Freq: Once | CUTANEOUS | Status: DC

## 2015-12-07 MED ORDER — BUPIVACAINE-EPINEPHRINE (PF) 0.25% -1:200000 IJ SOLN
INTRAMUSCULAR | Status: AC
  Filled 2015-12-07: qty 30

## 2015-12-07 MED ORDER — PANTOPRAZOLE SODIUM 40 MG PO TBEC: 40.0000 mg | DELAYED_RELEASE_TABLET | Freq: Every day | ORAL | Status: DC

## 2015-12-07 MED ORDER — ROCURONIUM 10MG/ML (10ML) SYRINGE FOR MEDFUSION PUMP - OPTIME
INTRAVENOUS | Status: DC | PRN
  Administered 2015-12-07: 10 mg via INTRAVENOUS
  Administered 2015-12-07: 50 mg via INTRAVENOUS
  Administered 2015-12-07: 10 mg via INTRAVENOUS

## 2015-12-07 MED ORDER — MIDAZOLAM HCL 2 MG/2ML IJ SOLN
INTRAMUSCULAR | Status: AC
  Filled 2015-12-07: qty 2

## 2015-12-07 MED ORDER — HYDROMORPHONE HCL 1 MG/ML IJ SOLN
1.0000 mg | INTRAMUSCULAR | Status: DC | PRN
  Administered 2015-12-07: 1 mg via INTRAVENOUS
  Filled 2015-12-07: qty 1

## 2015-12-07 MED ORDER — ACETAMINOPHEN 10 MG/ML IV SOLN
INTRAVENOUS | Status: AC
  Filled 2015-12-07: qty 100

## 2015-12-07 MED ORDER — HYDROMORPHONE HCL 1 MG/ML IJ SOLN
INTRAMUSCULAR | Status: AC
  Filled 2015-12-07: qty 1

## 2015-12-07 MED ORDER — FENTANYL CITRATE (PF) 100 MCG/2ML IJ SOLN
INTRAMUSCULAR | Status: DC | PRN
  Administered 2015-12-07 (×2): 50 ug via INTRAVENOUS
  Administered 2015-12-07: 100 ug via INTRAVENOUS

## 2015-12-07 MED ORDER — PROPOFOL 10 MG/ML IV BOLUS
INTRAVENOUS | Status: AC
Start: 2015-12-07 — End: 2015-12-07
  Filled 2015-12-07: qty 20

## 2015-12-07 MED ORDER — ACETAMINOPHEN 10 MG/ML IV SOLN
INTRAVENOUS | Status: DC | PRN
  Administered 2015-12-07: 1000 mg via INTRAVENOUS

## 2015-12-07 MED ORDER — PROPOFOL 10 MG/ML IV BOLUS
INTRAVENOUS | Status: DC | PRN
  Administered 2015-12-07: 20 mg via INTRAVENOUS
  Administered 2015-12-07: 30 mg via INTRAVENOUS
  Administered 2015-12-07: 200 mg via INTRAVENOUS

## 2015-12-07 MED ORDER — MIDAZOLAM HCL 5 MG/5ML IJ SOLN
INTRAMUSCULAR | Status: DC | PRN
  Administered 2015-12-07: 2 mg via INTRAVENOUS

## 2015-12-07 MED ORDER — METHOCARBAMOL 500 MG PO TABS
500.0000 mg | ORAL_TABLET | Freq: Four times a day (QID) | ORAL | Status: DC | PRN
  Administered 2015-12-07 – 2015-12-08 (×2): 500 mg via ORAL
  Filled 2015-12-07 (×2): qty 1

## 2015-12-07 MED ORDER — HYDROMORPHONE HCL 1 MG/ML IJ SOLN
0.2500 mg | INTRAMUSCULAR | Status: DC | PRN
  Administered 2015-12-07 (×2): 0.5 mg via INTRAVENOUS

## 2015-12-07 MED ORDER — OXYCODONE HCL 5 MG PO TABS
5.0000 mg | ORAL_TABLET | ORAL | Status: DC | PRN
  Administered 2015-12-07 – 2015-12-08 (×2): 10 mg via ORAL
  Filled 2015-12-07 (×2): qty 2

## 2015-12-07 MED ORDER — SUGAMMADEX SODIUM 200 MG/2ML IV SOLN
INTRAVENOUS | Status: DC | PRN
  Administered 2015-12-07: 200 mg via INTRAVENOUS

## 2015-12-07 MED ORDER — LIDOCAINE 2% (20 MG/ML) 5 ML SYRINGE
INTRAMUSCULAR | Status: AC
  Filled 2015-12-07: qty 5

## 2015-12-07 MED ORDER — DEXAMETHASONE SODIUM PHOSPHATE 4 MG/ML IJ SOLN
INTRAMUSCULAR | Status: DC | PRN
Start: 2015-12-07 — End: 2015-12-07
  Administered 2015-12-07: 10 mg via INTRAVENOUS

## 2015-12-07 MED ORDER — ONDANSETRON HCL 4 MG/2ML IJ SOLN: 4.0000 mg | Freq: Four times a day (QID) | INTRAMUSCULAR | Status: DC | PRN

## 2015-12-07 MED ORDER — LACTATED RINGERS IV SOLN
INTRAVENOUS | Status: DC
  Administered 2015-12-08: 04:00:00 via INTRAVENOUS
  Filled 2015-12-07 (×4): qty 1000

## 2015-12-07 MED ORDER — PHENYLEPHRINE 40 MCG/ML (10ML) SYRINGE FOR IV PUSH (FOR BLOOD PRESSURE SUPPORT)
PREFILLED_SYRINGE | INTRAVENOUS | Status: AC
  Filled 2015-12-07: qty 10

## 2015-12-07 SURGICAL SUPPLY — 48 items
APPLIER CLIP LOGIC TI 5 (MISCELLANEOUS) IMPLANT
BINDER ABD UNIV 10 28-50 (GAUZE/BANDAGES/DRESSINGS) ×1 IMPLANT
BINDER ABDOM UNIV 10 (GAUZE/BANDAGES/DRESSINGS) ×3
BLADE SURG ROTATE 9660 (MISCELLANEOUS) ×3 IMPLANT
CANISTER SUCTION 2500CC (MISCELLANEOUS) IMPLANT
CHLORAPREP W/TINT 26ML (MISCELLANEOUS) ×3 IMPLANT
COVER SURGICAL LIGHT HANDLE (MISCELLANEOUS) ×3 IMPLANT
DECANTER SPIKE VIAL GLASS SM (MISCELLANEOUS) ×3 IMPLANT
DERMABOND ADVANCED (GAUZE/BANDAGES/DRESSINGS) ×2
DERMABOND ADVANCED .7 DNX12 (GAUZE/BANDAGES/DRESSINGS) ×1 IMPLANT
DEVICE SECURE STRAP 25 ABSORB (INSTRUMENTS) ×9 IMPLANT
DEVICE TROCAR PUNCTURE CLOSURE (ENDOMECHANICALS) ×3 IMPLANT
DRAPE LAPAROSCOPIC ABDOMINAL (DRAPES) ×3 IMPLANT
ELECT REM PT RETURN 9FT ADLT (ELECTROSURGICAL) ×3
ELECTRODE REM PT RTRN 9FT ADLT (ELECTROSURGICAL) ×1 IMPLANT
GLOVE BIOGEL PI IND STRL 7.0 (GLOVE) ×1 IMPLANT
GLOVE BIOGEL PI IND STRL 8 (GLOVE) ×1 IMPLANT
GLOVE BIOGEL PI INDICATOR 7.0 (GLOVE) ×2
GLOVE BIOGEL PI INDICATOR 8 (GLOVE) ×2
GLOVE EUDERMIC 7 POWDERFREE (GLOVE) ×3 IMPLANT
GLOVE SURG SS PI 7.0 STRL IVOR (GLOVE) ×9 IMPLANT
GLOVE SURG SS PI 8.0 STRL IVOR (GLOVE) ×3 IMPLANT
GOWN STRL REUS W/ TWL LRG LVL3 (GOWN DISPOSABLE) ×2 IMPLANT
GOWN STRL REUS W/ TWL XL LVL3 (GOWN DISPOSABLE) ×1 IMPLANT
GOWN STRL REUS W/TWL LRG LVL3 (GOWN DISPOSABLE) ×4
GOWN STRL REUS W/TWL XL LVL3 (GOWN DISPOSABLE) ×2
KIT BASIN OR (CUSTOM PROCEDURE TRAY) ×3 IMPLANT
KIT ROOM TURNOVER OR (KITS) ×3 IMPLANT
LIQUID BAND (GAUZE/BANDAGES/DRESSINGS) ×3 IMPLANT
MARKER SKIN DUAL TIP RULER LAB (MISCELLANEOUS) ×3 IMPLANT
MESH VENTRALIGHT ST 6IN CRC (Mesh General) ×3 IMPLANT
NEEDLE SPNL 22GX3.5 QUINCKE BK (NEEDLE) ×3 IMPLANT
NS IRRIG 1000ML POUR BTL (IV SOLUTION) ×3 IMPLANT
PAD ARMBOARD 7.5X6 YLW CONV (MISCELLANEOUS) ×6 IMPLANT
SCALPEL HARMONIC ACE (MISCELLANEOUS) ×3 IMPLANT
SCISSORS LAP 5X35 DISP (ENDOMECHANICALS) ×3 IMPLANT
SET IRRIG TUBING LAPAROSCOPIC (IRRIGATION / IRRIGATOR) IMPLANT
SLEEVE ENDOPATH XCEL 5M (ENDOMECHANICALS) ×6 IMPLANT
SPONGE GAUZE 4X4 12PLY STER LF (GAUZE/BANDAGES/DRESSINGS) ×3 IMPLANT
SUT MON AB 4-0 PC3 18 (SUTURE) ×3 IMPLANT
SUT NOVA NAB DX-16 0-1 5-0 T12 (SUTURE) ×6 IMPLANT
TOWEL OR 17X24 6PK STRL BLUE (TOWEL DISPOSABLE) ×3 IMPLANT
TOWEL OR 17X26 10 PK STRL BLUE (TOWEL DISPOSABLE) ×3 IMPLANT
TRAY FOLEY CATH 16FR SILVER (SET/KITS/TRAYS/PACK) ×3 IMPLANT
TRAY LAPAROSCOPIC MC (CUSTOM PROCEDURE TRAY) ×3 IMPLANT
TROCAR XCEL NON-BLD 11X100MML (ENDOMECHANICALS) ×3 IMPLANT
TROCAR XCEL NON-BLD 5MMX100MML (ENDOMECHANICALS) ×3 IMPLANT
TUBING INSUFFLATION (TUBING) ×3 IMPLANT

## 2015-12-07 NOTE — Op Note (Signed)
Patient Name:           [name]   Date of Surgery:        12/07/2015  Pre op Diagnosis:      Incarcerated incisional hernia  Post op Diagnosis:    Incarcerated incisional hernia  Procedure:                 Laparoscopic repair of incarcerated incisional hernia with 15 x 15 cm diameter ventralex ST composite mesh  Surgeon:                     Edsel Petrin. Dalbert Batman, M.D., FACS  Assistant:                      OR staff  Operative Indications:    This is [name] who lives in [place]. He is referred by [name] at [facility] for evaluation of a periumbilical incisional hernia.     He underwent laparoscopic cholecystectomy by [name] 7 years ago. Went well. Noticed a bulge shortly thereafter which has been getting larger and more painful. Now wants to have something done. No history of incarceration or obstruction.  I was able to reduce the hernia in the office.  This is a trocar site hernia.  It has been getting more painful the last 2 weeks I could not reduce it in the preop area..      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 No recurrence. Treated for GERD. Because of chest pain had cardiac workup at [facility] which he says was negative but they still give him a bottle of nitroglycerin. Has had hemorrhoid surgery.      He would like to have this repaired.  He is brought to the operating room electively for laparoscopic repair with mesh.  Operative Findings:       There was a 2.5 cm diameter piece of incarcerated omentum in the hernia.  Once this was reduced it appeared viable but was hemorrhagic.  There was no involvement of the small or large intestine.  There were no other pathologic abnormalities on 4 quadrant inspection.  The repair was an inlay mesh placement,  15 cm diameter piece of composite mesh.  Procedure in Detail:          Following the induction of general endotracheal anesthesia a Foley catheter was placed and the abdomen was prepped and draped in a sterile fashion.  Surgical timeout was performed.  Intravenous antibiotics were given.  0.5% Marcaine with epinephrine was used as local infiltration anesthetic.     A 5 mm optical trocar was placed in the left subcostal position.  Entry was uneventful.  Pneumoperitoneum was created.  There was no evidence of bleeding or bowel injury.  12 mm trochar placed in the left flank and a 5 mm trocar placed in the left lower quadrant.  I reduced the incarcerated omentum.  The defect was less than 3 cm.  I debrided the hernia sac and removed that from the abdominal cavity.  A 15 cm diameter piece of mesh would give [place] 6-7 cm overlap in all directions.  That was brought to the operative field.  I marked a template on the abdominal wall for 6 equidistant suture fixation sites.  I placed #1 Novafil in the mesh at the 6  planned suture fixation sites.  I then moistened the mesh and inserted it through the larger trocar.  I spread the mesh out.  Being very careful to be sure that the rough side was up toward the abdominal wall and the smooth side down toward the viscera.  At each of the 6 suture fixation sites I made a puncture wound and using an Endo Close device drew the sutures up through the abdominal wall, being careful to take about a 1 cm bite of tissue.  After all the sutures were placed I lifted the mesh up and it deployed nicely with essentially no redundancy.  I released the pneumoperitoneum and then tied all of the sutures so there would be no tension.  I then reinsufflated and the placement looked good.  I then further secured the mesh to the abdominal wall with a secure strap device, double crown technique.  I probably used 75 of these secure strap devicesto secure  the mesh into the abdominal wall.  I was very  careful not to leave any more than a 1 cm gap between the suture fixation devices.  This looked good.  There was no bleeding.  Repair looks fine.  The pneumoperitoneum was released and the trocars removed.  All the trocar sites and suture fixation sites were closed with subcuticular 4-0 Monocryl and Dermabond.  A compression bandage was placed at the umbilicus and a abdominal binder was placed in the Foley catheter removed.  The patient was taken recovery room in stable condition.  EBL 10-20 mL.  Counts correct.  Complications none.     Edsel Petrin. Dalbert Batman, M.D., FACS General and Minimally Invasive Surgery Breast and Colorectal Surgery  12/07/2015 2:03 PM

## 2015-12-07 NOTE — Transfer of Care (Signed)
Immediate Anesthesia Transfer of Care Note  Patient: [name]  Procedure(s) Performed: Procedure(s): LAPAROSCOPIC INCISIONAL HERNIA WITH MESH  (N/A)  Patient Location: PACU  Anesthesia Type:General  Level of Consciousness: sedated  Airway & Oxygen Therapy: Patient Spontanous Breathing and Patient connected to face mask oxygen  Post-op Assessment: Report given to RN and Post -op Vital signs reviewed and stable  Post vital signs: Reviewed and stable  Last Vitals:  Vitals:   12/07/15 1018 12/07/15 1413  BP: (!) 153/89   Pulse: (!) 58   Resp: 18   Temp: 37.1 C (P) 36.4 C    Last Pain:  Vitals:   12/07/15 1023  TempSrc:   PainSc: 3       Patients Stated Pain Goal: 5 ([id number])  Complications: No apparent anesthesia complications

## 2015-12-07 NOTE — Anesthesia Preprocedure Evaluation (Addendum)
Anesthesia Evaluation  Patient identified by MRN, date of birth, ID band Patient awake    Reviewed: Allergy & Precautions, NPO status , Patient's Chart, lab work & pertinent test results  History of Anesthesia Complications Negative for: history of anesthetic complications  Airway Mallampati: II  TM Distance: <3 FB     Dental  (+) Partial Upper, Dental Advisory Given   Pulmonary neg pulmonary ROS,    breath sounds clear to auscultation       Cardiovascular  Rhythm:Regular Rate:Normal     Neuro/Psych negative neurological ROS     GI/Hepatic GERD  ,  Endo/Other  negative endocrine ROS  Renal/GU negative Renal ROS     Musculoskeletal   Abdominal   Peds  Hematology negative hematology ROS (+)   Anesthesia Other Findings   Reproductive/Obstetrics                            Anesthesia Physical Anesthesia Plan  ASA: II  Anesthesia Plan: General   Post-op Pain Management:    Induction: Intravenous  Airway Management Planned: Oral ETT  Additional Equipment:   Intra-op Plan:   Post-operative Plan: Extubation in OR  Informed Consent: I have reviewed the patients History and Physical, chart, labs and discussed the procedure including the risks, benefits and alternatives for the proposed anesthesia with the patient or authorized representative who has indicated his/her understanding and acceptance.   Dental advisory given  Plan Discussed with: CRNA  Anesthesia Plan Comments:         Anesthesia Quick Evaluation

## 2015-12-07 NOTE — Interval H&P Note (Signed)
History and Physical Interval Note:  12/07/2015 11:33 AM  [name]  has presented today for surgery, with the diagnosis of incisional hernia  The various methods of treatment have been discussed with the patient and family. After consideration of risks, benefits and other options for treatment, the patient has consented to  Procedure(s): [place] (N/A) as a surgical intervention .  The patient's history has been reviewed, patient examined, no change in status, stable for surgery.  I have reviewed the patient's chart and labs.  Questions were answered to the patient's satisfaction.     Adin Hector

## 2015-12-07 NOTE — Anesthesia Procedure Notes (Addendum)
Procedure Name: Intubation Date/Time: 12/07/2015 12:27 PM Performed by: Huey Romans ANN Pre-anesthesia Checklist: Patient identified, Emergency Drugs available, Suction available and Patient being monitored Patient Re-evaluated:Patient Re-evaluated prior to inductionOxygen Delivery Method: Circle System Utilized Preoxygenation: Pre-oxygenation with 100% oxygen Intubation Type: IV induction Ventilation: Mask ventilation without difficulty and Oral airway inserted - appropriate to patient size Laryngoscope Size: Miller and 3 Grade View: Grade I Tube type: Oral Tube size: 7.5 mm Number of attempts: 1 Airway Equipment and Method: Stylet,  Oral airway and LTA kit utilized Placement Confirmation: ETT inserted through vocal cords under direct vision,  positive ETCO2 and breath sounds checked- equal and bilateral Secured at: 22 cm Tube secured with: Tape Dental Injury: Teeth and Oropharynx as per pre-operative assessment

## 2015-12-08 ENCOUNTER — Encounter (HOSPITAL_COMMUNITY): Payer: Self-pay | Admitting: General Surgery

## 2015-12-08 DIAGNOSIS — K430 Incisional hernia with obstruction, without gangrene: Secondary | ICD-10-CM | POA: Diagnosis not present

## 2015-12-08 MED ORDER — OXYCODONE HCL 5 MG PO TABS: 5.0000 mg | ORAL_TABLET | ORAL | 0 refills | Status: DC | PRN

## 2015-12-08 NOTE — Anesthesia Postprocedure Evaluation (Signed)
Anesthesia Post Note  Patient: [name]  Procedure(s) Performed: Procedure(s) (LRB): LAPAROSCOPIC INCISIONAL HERNIA WITH MESH  (N/A)  Patient location during evaluation: PACU Anesthesia Type: General Level of consciousness: awake and alert Pain management: pain level controlled Vital Signs Assessment: post-procedure vital signs reviewed and stable Respiratory status: spontaneous breathing, nonlabored ventilation, respiratory function stable and patient connected to nasal cannula oxygen Cardiovascular status: blood pressure returned to baseline and stable Postop Assessment: no signs of nausea or vomiting Anesthetic complications: no    Last Vitals:  Vitals:   12/08/15 0152 12/08/15 0605  BP: 137/82 122/73  Pulse: 82 80  Resp: 16 16  Temp: 36.9 C 36.9 C    Last Pain:  Vitals:   12/08/15 0605  TempSrc: Oral  PainSc:                  Keaghan Bowens,JAMES TERRILL

## 2015-12-08 NOTE — Progress Notes (Signed)
Patient discharged home with instructions. 

## 2015-12-08 NOTE — Discharge Instructions (Signed)
In addition to the detailed instructions listed above, I advise that you take Senokot, 2 tablets twice a day to prevent constipation until your bowel function has normalized.

## 2015-12-08 NOTE — Discharge Summary (Signed)
Patient ID: [name] [id number] 60 y.o. [birth year]  Admit date: 12/07/2015  Discharge date and time: 12/08/2015  Admitting Physician: [name]  Discharge Physician: [name]  Admission Diagnoses: incisional hernia  Discharge Diagnoses: Incarcerated incisional hernia  Operations: Procedure(s): LAPAROSCOPIC INCISIONAL HERNIA WITH MESH   Admission Condition: good  Discharged Condition: good  Indication for Admission: This is [name] who lives in [place]. He is referred by Dr. Octavio Haney at [facility] for evaluation of a periumbilical incisional hernia.     He underwent laparoscopic cholecystectomy by Dr. Anthony Haney 7 years ago. Went well. Noticed a bulge shortly thereafter which has been getting larger and more painful. Now wants to have something done. No history of incarceration or obstruction.  I was able to reduce the hernia in the office.  This is a trocar site hernia.  It has been getting more painful the last 2 weeks I could not reduce it in the preop area..      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 with multiple surgeries and radiation therapy. No recurrence. Treated for GERD. Because of chest pain had cardiac workup at [facility] which he says was negative but they still give him a bottle of nitroglycerin. Has had hemorrhoid surgery.      He would like to have this repaired.  He is brought to the operating room electively for laparoscopic repair with mesh.       He was admitted postop for pain control and to observe for complications.  Hospital Course: On the day of admission the patient was taken to the operating room and underwent laparoscopic repair of incarcerated incisional hernia with mesh    Operative findings were  a 2.5 cm diameter piece of incarcerated omentum in the hernia.   Once this was reduced it appeared viable but was hemorrhagic.  There was no involvement of the small or large intestine.   the defect was clearly less than 3 cm.  There were no other pathologic abnormalities on 4 quadrant inspection.  The repair was an inlay mesh placement, 15 cm diameter piece of composite mesh.     The patient was admitted for observation overnight.  He did very well.  On postop day 1 he was ambulating in the halls, voiding uneventfully, tolerating regular diet, pain was controlled and he was ready to go home.  Examination of the day of discharge reveals his abdomen was soft.  Active bowel sounds.  Appropriately tender.  All the wounds looked good.  No signs of bleeding.  Lungs were clear.     He was given instruction in diet and activities.  He was given a prescription for oxycodone for pain.  I've asked him to return to see me in the office in 2 weeks.  Consults: None  Significant Diagnostic Studies: none  Treatments: surgery: Laparoscopic repair of incarcerated incisional hernia with mesh  Disposition: Home  Patient Instructions:    Medication List    TAKE these medications   ibuprofen 200 MG tablet Commonly known as:  ADVIL,MOTRIN Take 200 mg by mouth every 6 (six) hours as needed for moderate pain.   oxyCODONE 5 MG immediate release tablet Commonly known as:  Oxy IR/ROXICODONE Take 1-2 tablets (5-10 mg total) by mouth every 4 (four) hours as needed for moderate pain.   pantoprazole 40 MG tablet Commonly known as:  PROTONIX Take 40 mg by  mouth daily.       Activity: Unlimited ambulation.  Patient may shower but no tub baths.  No sports or lifting more than 20 pounds for 6 weeks. Diet: low fat, low cholesterol diet Wound Care: none needed  Follow-up:  With Dr. [name] in 2 weeks.  Signed: Edsel Haney. [name], M.D., FACS General and minimally invasive surgery Breast and Colorectal Surgery  7:14 AM

## 2016-04-17 ENCOUNTER — Encounter: Payer: Self-pay | Admitting: Radiation Oncology

## 2016-04-27 ENCOUNTER — Encounter: Payer: Self-pay | Admitting: Radiation Oncology

## 2016-04-27 NOTE — Progress Notes (Signed)
GU Location of Tumor / Histology: prostatic adenocarcinoma  If Prostate Cancer, Gleason Score is (3 + 4) and PSA is 5.9 on 03/14/16.  Reports PCP, Dr. [name], found elevated PSA of 5.9. Dr. [name] referred patient to Dr. Dalbert Batman for hernia repair. Dr. Dalbert Batman referred patient to his friend Dr. Diona Fanti for management of elevated PSA.   Biopsies of prostate (if applicable) revealed:    Past/Anticipated interventions by urology, if any: biopsy, referral to radiation oncology  Past/Anticipated interventions by medical oncology, if any: no  Weight changes, if any: no  Bowel/Bladder complaints, if any:  IPSS 5 with nocturia x 3, incomplete emptying and frequency  Nausea/Vomiting, if any: no  Pain issues, if any:  no  SAFETY ISSUES:  Prior radiation? Yes. Dr. Albertine Patricia at [facility] in 2013. Reports he received 30 tx for skin ca right face.  Pacemaker/ICD? No  Possible current pregnancy? no  Is the patient on methotrexate?   Current Complaints / other details:  61 year old male. Married with 3 sons. Mother had colon cancer. Hx of skin cancer. Prostate volume 41 mL. Patient is more interested in radiotherapy than surgery. Patient most interested in seeds.

## 2016-04-30 ENCOUNTER — Ambulatory Visit
Admission: RE | Admit: 2016-04-30 | Discharge: 2016-04-30 | Disposition: A | Discharge status: Home / Self Care | Payer: Managed Care, Other (non HMO) | Source: Ambulatory Visit | Attending: Radiation Oncology | Admitting: Radiation Oncology

## 2016-04-30 ENCOUNTER — Encounter: Payer: Self-pay | Admitting: Radiation Oncology

## 2016-04-30 ENCOUNTER — Encounter: Payer: Self-pay | Admitting: Medical Oncology

## 2016-04-30 VITALS — BP 145/88 | HR 64 | Temp 98.6°F | Resp 16 | Ht 68.0 in | Wt 225.6 lb

## 2016-04-30 DIAGNOSIS — C61 Malignant neoplasm of prostate: Secondary | ICD-10-CM | POA: Insufficient documentation

## 2016-04-30 DIAGNOSIS — R3911 Hesitancy of micturition: Secondary | ICD-10-CM | POA: Diagnosis not present

## 2016-04-30 DIAGNOSIS — Z79899 Other long term (current) drug therapy: Secondary | ICD-10-CM | POA: Diagnosis not present

## 2016-04-30 DIAGNOSIS — Z79891 Long term (current) use of opiate analgesic: Secondary | ICD-10-CM | POA: Insufficient documentation

## 2016-04-30 DIAGNOSIS — R350 Frequency of micturition: Secondary | ICD-10-CM | POA: Diagnosis not present

## 2016-04-30 DIAGNOSIS — Z885 Allergy status to narcotic agent status: Secondary | ICD-10-CM | POA: Insufficient documentation

## 2016-04-30 DIAGNOSIS — Z510 Encounter for antineoplastic radiation therapy: Secondary | ICD-10-CM | POA: Insufficient documentation

## 2016-04-30 HISTORY — DX: Malignant neoplasm of prostate: C61

## 2016-04-30 NOTE — Progress Notes (Signed)
Radiation Oncology         [phone] ________________________________  Initial outpatient Consultation  Name: [name] MRN: [id number]  Date: 04/30/2016  DOB: October 11, 1955  [id number] BUTLER, DO  Haney, James Main, MD   REFERRING PHYSICIAN: Franchot Gallo, MD  DIAGNOSIS: 61 y.o. 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 Malignant neoplasm of prostate ([place]) [place] D [name] is a 61 y.o. gentleman.  He was noted to have an elevated PSA of 5.9 by his primary care physician, Dr. [name]. Dr. [name] referred patient to Dr. Dalbert Batman for hernia repair and he referred to Dr. Diona Fanti. Accordingly, he had surgery to repair his hernia on 12/07/15 and had mesh placed. He was referred for evaluation in urology by Dr. Diona Fanti on 01/09/16, digital rectal examination was performed at that time revealing no nodularity. The patient proceeded to transrectal ultrasound with 12 biopsies of the prostate on 03/14/16. The prostate volume measured 41 cc.  Out of 12 core biopsies, 6 were positive.  The maximum Gleason score was 3+4, and this was seen in left apex lateral.  The patient reviewed the biopsy results with his urologist and he has kindly been referred today for discussion of potential radiation treatment options.    PREVIOUS RADIATION THERAPY: Yes; 2013   skin cancer, 30 fractions to the right face by Dr. Albertine Patricia at [facility]  Past Medical History:  Past Medical History:  Diagnosis Date  . Cancer ([place])    basal cell carcoma, squamous cell?  . GERD (gastroesophageal reflux disease)   . Prostate cancer ([place])   . Skin cancer   . Snoring     Past Surgical History: Past Surgical History:  Procedure Laterality Date  . cancer removal     face  . CHOLECYSTECTOMY    . COLONOSCOPY    . HEMORRHOID SURGERY    . INCISIONAL HERNIA REPAIR  12/07/2015   WITH MESH  . INCISIONAL HERNIA REPAIR N/A 12/07/2015   Procedure: LAPAROSCOPIC INCISIONAL HERNIA WITH MESH ;  Surgeon: Fanny Skates, MD;  Location: [facility];  Service: General;  Laterality: N/A;  . PROSTATE BIOPSY    . SKIN GRAFT     from neck to face on right side    Social History:  Social History   Social History  . Marital status: Married    Spouse name: N/A  . Number of children: N/A  . Years of education: N/A   Occupational History  . Not on file.   Social History Main Topics  . Smoking status: Former Smoker    Packs/day: 0.50    Years: 10.00    Types: Cigarettes    Quit date: 04/09/1978  . Smokeless tobacco: Never Used     Comment: QUIT SMOKING IN THE 1980'S  . Alcohol use No  . Drug use: No  . Sexual activity: Yes   Other Topics Concern  . Not on file   Social History Narrative  . No narrative on file   The patient is married and lives in [place], [place]. 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   Family History: Family History  Problem Relation Age of Onset  . Cancer Mother     colon    ALLERGIES: Azithromycin; Ciprofloxacin; and Codeine  MEDICATIONS:  Current Outpatient Prescriptions  Medication Sig Dispense Refill  . gabapentin (  NEURONTIN) 100 MG capsule Take 300 mg by mouth.    Marland Kitchen ibuprofen (ADVIL,MOTRIN) 200 MG tablet Take 200 mg by mouth every 6 (six) hours as needed for moderate pain.    Marland Kitchen oxyCODONE (OXY IR/ROXICODONE) 5 MG immediate release tablet Take 1-2 tablets (5-10 mg total) by mouth every 4 (four) hours as needed for moderate pain. 30 tablet 0  . pantoprazole (PROTONIX) 40 MG tablet Take 40 mg by mouth daily.     No current facility-administered medications for this encounter.     REVIEW OF SYSTEMS:  On review of systems, the patient reports that he is doing well overall. He has healed and is released now from Dr. Dalbert Batman. He denies any chest pain, shortness of breath, cough, fevers, chills, night sweats, unintended weight changes.  He denies any bowel  disturbances, and denies abdominal pain, nausea or vomiting. He denies any new musculoskeletal or joint aches or pains, new skin lesions or concerns. The patient completed an IPSS and IIEF questionnaire.  His IPSS score was 5 indicating mild urinary outflow obstructive symptoms. He is positive for nocturia x 3, incomplete emptying, and frequency. He indicated that his erectile function is able to complete sexual activity with most attempts. A complete review of systems is obtained and is otherwise negative.   PHYSICAL EXAM:     height is 5\' 8"  (1.727 m) and weight is 225 lb 9.6 oz (102.3 kg). His respiration is 16 and oxygen saturation is 100%.   In general this is a well appearing caucasian male in no acute distress. He is alert and oriented x4 and appropriate throughout the examination. HEENT reveals that the patient is normocephalic, atraumatic. EOMs are intact. PERRLA. Skin is intact without any evidence of gross lesions. Cardiovascular exam reveals a regular rate and rhythm, no clicks rubs or murmurs are auscultated. Chest is clear to auscultation bilaterally. Lymphatic assessment is performed and does not reveal any adenopathy in the cervical, supraclavicular, axillary, or inguinal chains. Abdomen has active bowel sounds in all quadrants and is intact. His umbilical incision site is well healed without separation, bleeding, or cellulitic changes. The abdomen is soft, non tender, non distended. Lower extremities are negative for pretibial pitting edema, deep calf tenderness, cyanosis or clubbing.   KPS = 100  100 - Normal; no complaints; no evidence of disease. 90   - Able to carry on normal activity; minor signs or symptoms of disease. 80   - Normal activity with effort; some signs or symptoms of disease. 29   - Cares for self; unable to carry on normal activity or to do active work. 60   - Requires occasional assistance, but is able to care for most of his personal needs. 50    - Requires considerable assistance and frequent medical care. 82   - Disabled; requires special care and assistance. 32   - Severely disabled; hospital admission is indicated although death not imminent. 82   - Very sick; hospital admission necessary; active supportive treatment necessary. 10   - Moribund; fatal processes progressing rapidly. 0     - Dead  Karnofsky DA, Abelmann Hudson, Craver LS and Burchenal [facility] [phone]) The use of the nitrogen mustards in the palliative treatment of carcinoma: with particular reference to bronchogenic carcinoma Cancer 1 634-56   LABORATORY DATA:  Lab Results  Component Value Date   WBC 8.9 12/07/2015   HGB 15.3 12/07/2015   HCT 44.8 12/07/2015   MCV 87.8 12/07/2015   PLT 203 12/07/2015  Lab Results  Component Value Date   NA 140 12/06/2015   K 4.3 12/06/2015   CL 107 12/06/2015   CO2 27 12/06/2015   Lab Results  Component Value Date   ALT 32 12/06/2015   AST 21 12/06/2015   ALKPHOS 61 12/06/2015   BILITOT 0.5 12/06/2015     RADIOGRAPHY: No results found.    IMPRESSION/PLAN:  1. 61 y.o. 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  His T-Stage, Gleason's Score, and PSA put him into the intermediate risk group. Dr. Tammi Klippel reviewed the findings and workup thus far.  We discussed the natural history of prostate cancer.  We reviewed the the implications of T-stage, Gleason's Score, and PSA on decision-making and outcomes in prostate cancer.  We discussed radiation treatment in the management of prostate cancer with regard to the logistics and delivery of external beam radiation treatment as well as the logistics and delivery of prostate brachytherapy.  We compared and contrasted each of these approaches and also compared these against prostatectomy.  The patient expressed interest in prostate brachytherapy.  I filled out a patient counseling form for him with relevant  treatment diagrams and we retained a copy for our records. The patient would like to proceed with prostate brachytherapy.  We will share this with Dr. Diona Fanti and move forward with scheduling the procedure in the near future.     The above documentation reflects my direct findings during this shared patient visit. Please see the separate note by Dr. Tammi Klippel on this date for the remainder of the patient's plan of care.     Carola Rhine, PAC   This document serves as a record of services personally performed by Shona Simpson, PA-C and Tyler Pita, MD. It was created on their behalf by Bethann Humble, a trained medical scribe. The creation of this record is based on the scribe's personal observations and the provider's statements to them. This document has been checked and approved by the attending provider.

## 2016-04-30 NOTE — Progress Notes (Signed)
See progress note under physician encounter. 

## 2016-05-02 ENCOUNTER — Telehealth: Payer: Self-pay | Admitting: *Deleted

## 2016-05-02 NOTE — Telephone Encounter (Signed)
Called to update patient about implant, lvm for a return call

## 2016-05-03 ENCOUNTER — Telehealth: Payer: Self-pay | Admitting: *Deleted

## 2016-05-03 NOTE — Telephone Encounter (Signed)
CALLED PATIENT TO REMIND OF APPTS. FOR 05-04-16, LVM FOR A RETURN CALL

## 2016-05-04 ENCOUNTER — Telehealth: Payer: Self-pay | Admitting: Radiation Oncology

## 2016-05-04 ENCOUNTER — Ambulatory Visit
Admission: RE | Admit: 2016-05-04 | Discharge: 2016-05-04 | Disposition: A | Discharge status: Home / Self Care | Payer: Managed Care, Other (non HMO) | Source: Ambulatory Visit | Attending: Radiation Oncology | Admitting: Radiation Oncology

## 2016-05-04 ENCOUNTER — Encounter: Payer: Self-pay | Admitting: Radiation Oncology

## 2016-05-04 DIAGNOSIS — Z510 Encounter for antineoplastic radiation therapy: Secondary | ICD-10-CM | POA: Diagnosis not present

## 2016-05-04 DIAGNOSIS — C61 Malignant neoplasm of prostate: Secondary | ICD-10-CM

## 2016-05-04 NOTE — Telephone Encounter (Signed)
[place] folder with complete FMLA paperwork for patient's wife placed in [place], [place] inbox to review and sign

## 2016-05-04 NOTE — Progress Notes (Signed)
  Radiation Oncology         [phone] ________________________________  Name: HURL RO MRN: [id number]  Date: 05/04/2016  DOB: July 24, 1955  SIMULATION AND TREATMENT PLANNING NOTE PUBIC ARCH STUDY  [id number] BUTLER, DO  Franchot Gallo, MD  DIAGNOSIS: 60 y.o. 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 Malignant neoplasm of prostate ([place]) 185 C61     COMPLEX SIMULATION:  The patient presented today for evaluation for possible prostate seed implant. He was brought to the radiation planning suite and placed supine on the CT couch. A 3-dimensional image study set was obtained in upload to the planning computer. There, on each axial slice, I contoured the prostate gland. Then, using three-dimensional radiation planning tools I reconstructed the prostate in view of the structures from the transperineal needle pathway to assess for possible pubic arch interference. In doing so, I did not appreciate any pubic arch interference. Also, the patient's prostate volume was estimated based on the drawn structure. The volume was 27 cc.  Given the pubic arch appearance and prostate volume, patient remains a good candidate to proceed with prostate seed implant. Today, he freely provided informed written consent to proceed.    PLAN: The patient will undergo prostate seed implant.   ________________________________  Sheral Apley. Tammi Klippel, M.D.

## 2016-05-04 NOTE — Progress Notes (Signed)
Paperwork (fmla, [employer] display and packaing) received 1/26, given to nurse

## 2016-05-08 ENCOUNTER — Encounter: Payer: Self-pay | Admitting: Radiation Oncology

## 2016-05-08 NOTE — Progress Notes (Signed)
Paperwork received from doctor, faxed to [facility] @ [phone], conf received 1/30. Copy mailed to patient

## 2016-05-10 ENCOUNTER — Telehealth: Payer: Self-pay | Admitting: *Deleted

## 2016-05-10 NOTE — Telephone Encounter (Signed)
Called patient to inform of implant, lvm for a return call 

## 2016-05-11 ENCOUNTER — Telehealth: Payer: Self-pay | Admitting: *Deleted

## 2016-05-11 NOTE — Telephone Encounter (Signed)
RETURNED PATIENT'S PHONE CALL, LVM FOR A RETURN CALL 

## 2016-05-15 ENCOUNTER — Other Ambulatory Visit: Payer: Self-pay | Admitting: Urology

## 2016-05-18 ENCOUNTER — Other Ambulatory Visit: Payer: Self-pay

## 2016-05-18 ENCOUNTER — Ambulatory Visit (HOSPITAL_BASED_OUTPATIENT_CLINIC_OR_DEPARTMENT_OTHER)
Admission: RE | Admit: 2016-05-18 | Discharge: 2016-05-18 | Disposition: A | Discharge status: Home / Self Care | Payer: Managed Care, Other (non HMO) | Source: Ambulatory Visit | Attending: Urology | Admitting: Urology

## 2016-05-18 ENCOUNTER — Encounter (HOSPITAL_BASED_OUTPATIENT_CLINIC_OR_DEPARTMENT_OTHER)
Admission: RE | Admit: 2016-05-18 | Discharge: 2016-05-18 | Disposition: A | Discharge status: Home / Self Care | Payer: Managed Care, Other (non HMO) | Source: Ambulatory Visit | Attending: Urology | Admitting: Urology

## 2016-05-18 DIAGNOSIS — C61 Malignant neoplasm of prostate: Secondary | ICD-10-CM | POA: Diagnosis not present

## 2016-05-18 DIAGNOSIS — Z01818 Encounter for other preprocedural examination: Secondary | ICD-10-CM | POA: Insufficient documentation

## 2016-05-18 IMAGING — CR DG CHEST 2V
2 series · 2 of 2 positions shown · non-contrast
Comparison: None.

CLINICAL DATA: Preoperative examination prior to prostate seed
implant. Former smoker.

EXAM:
CHEST  2 VIEW

[w chest pa]
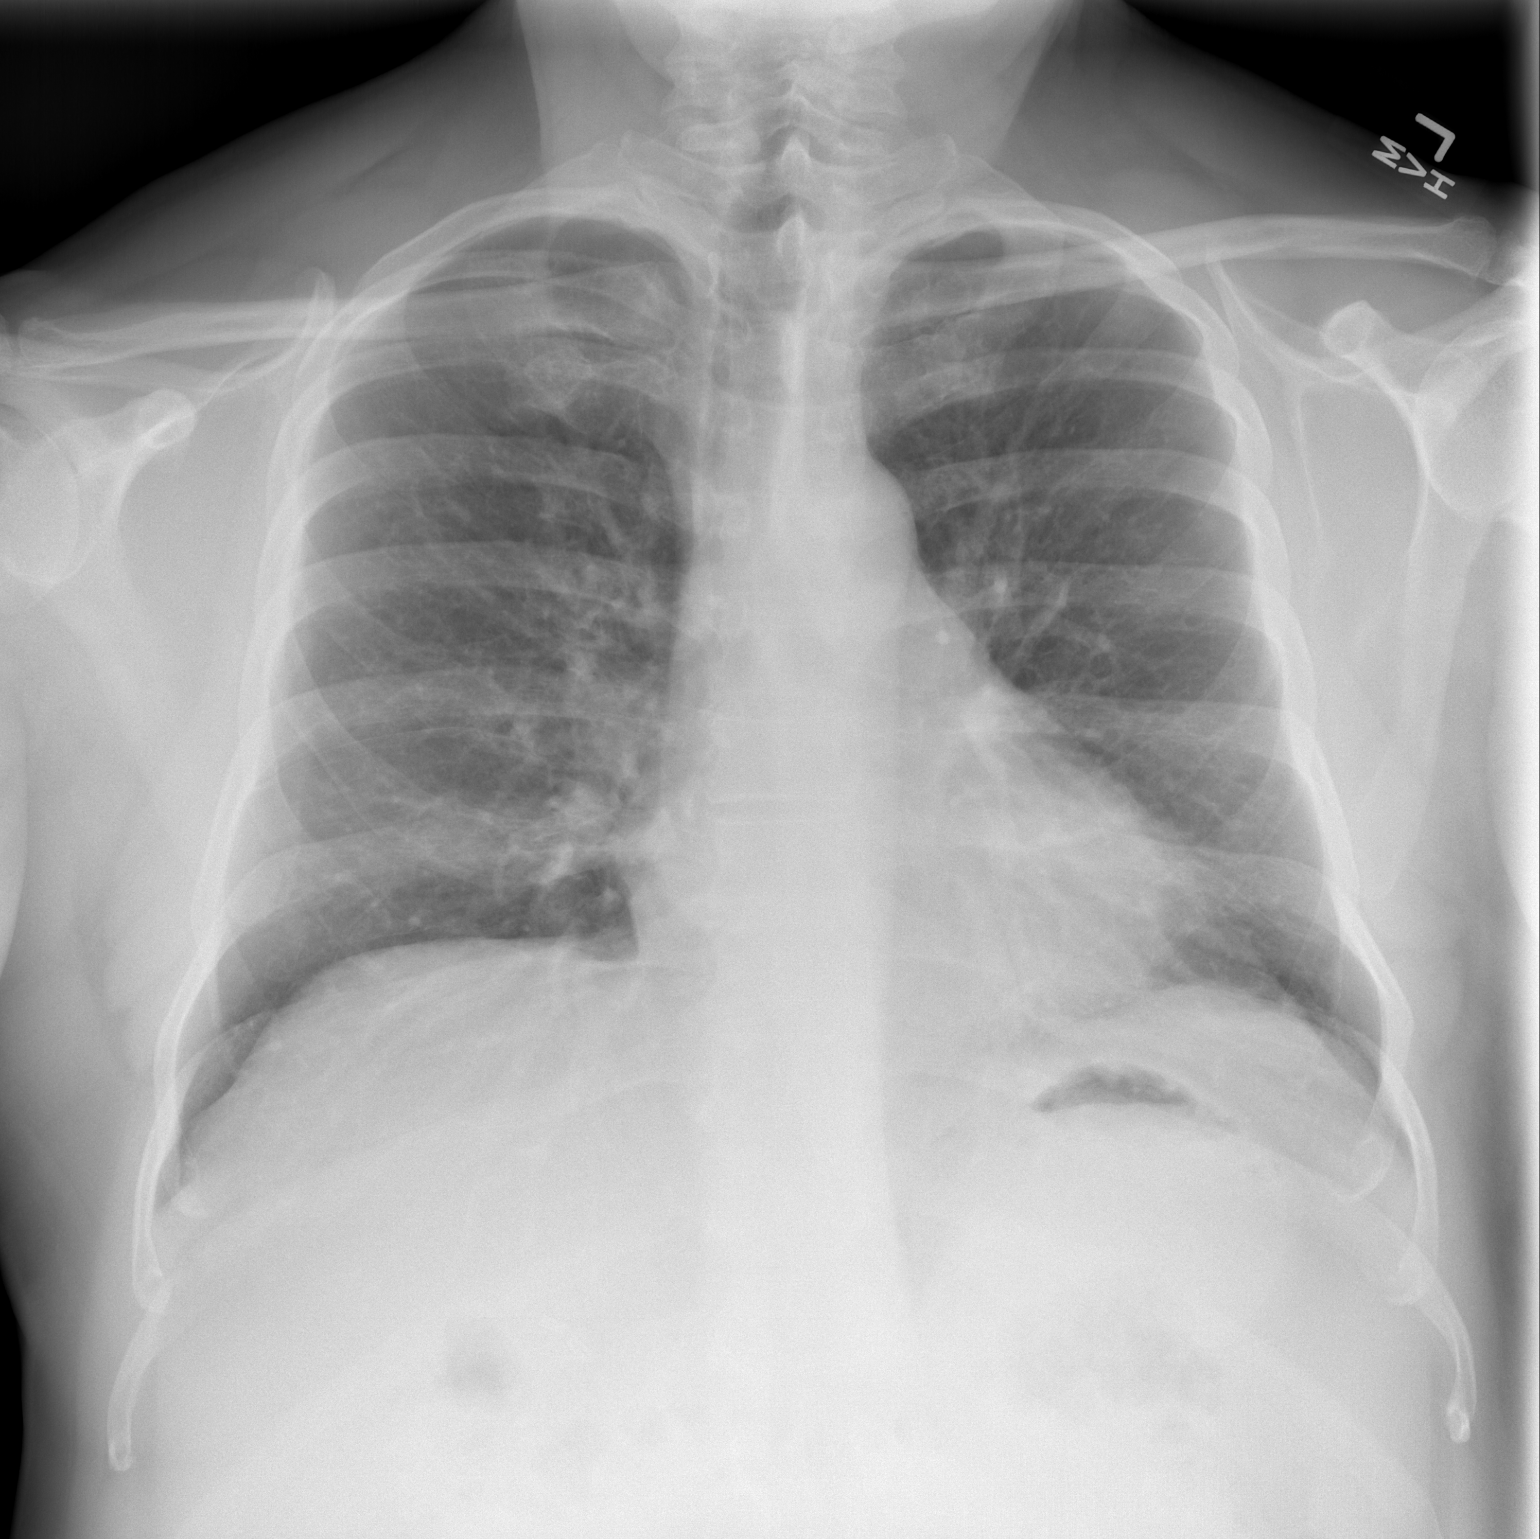

[w chest lat]
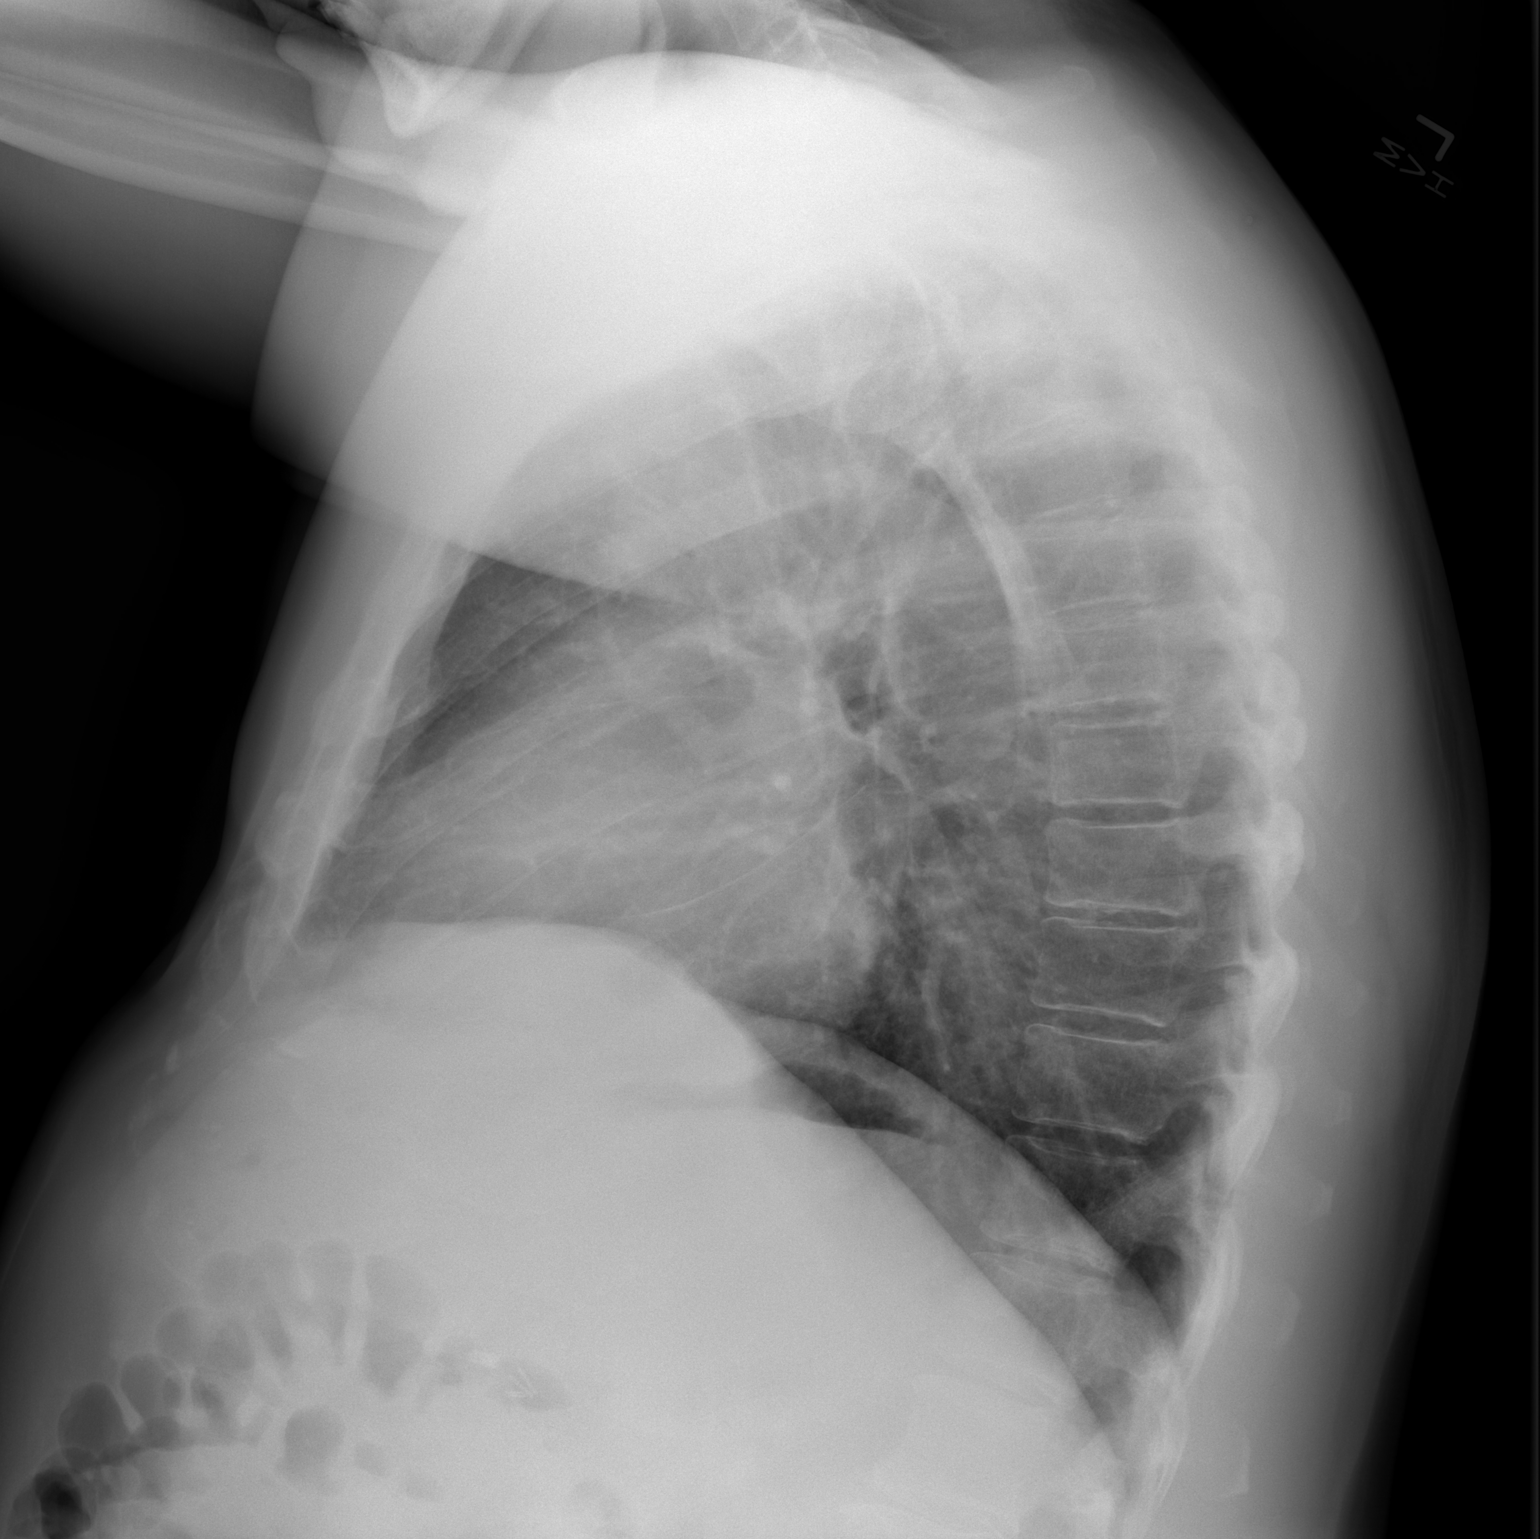

[2 of 2 positions shown; findings below may reference images not displayed]

FINDINGS: The cardiomediastinal silhouette is unremarkable.

There is no evidence of focal airspace disease, pulmonary edema,
suspicious pulmonary nodule/mass, pleural effusion, or pneumothorax.
No acute bony abnormalities are identified.
IMPRESSION: No active cardiopulmonary disease.

## 2016-06-21 ENCOUNTER — Telehealth: Payer: Self-pay | Admitting: *Deleted

## 2016-06-21 DIAGNOSIS — Z510 Encounter for antineoplastic radiation therapy: Secondary | ICD-10-CM | POA: Diagnosis not present

## 2016-06-21 NOTE — Telephone Encounter (Signed)
Called patient to remind of labs for implant on 06-29-16, lvm for a return call

## 2016-06-22 ENCOUNTER — Encounter (HOSPITAL_BASED_OUTPATIENT_CLINIC_OR_DEPARTMENT_OTHER): Payer: Self-pay | Admitting: *Deleted

## 2016-06-25 ENCOUNTER — Encounter (HOSPITAL_BASED_OUTPATIENT_CLINIC_OR_DEPARTMENT_OTHER): Payer: Self-pay | Admitting: *Deleted

## 2016-06-25 NOTE — Progress Notes (Signed)
To [facility] [place] after [place]-will take protonix,gabapentin with small amt water-to complete fleet enema prior to arrival.Labs to be drawn 06/28/16- Ekg, CXR with chart.

## 2016-06-28 ENCOUNTER — Telehealth: Payer: Self-pay | Admitting: *Deleted

## 2016-06-28 DIAGNOSIS — Z85828 Personal history of other malignant neoplasm of skin: Secondary | ICD-10-CM | POA: Diagnosis not present

## 2016-06-28 DIAGNOSIS — C61 Malignant neoplasm of prostate: Secondary | ICD-10-CM | POA: Diagnosis not present

## 2016-06-28 DIAGNOSIS — Z79899 Other long term (current) drug therapy: Secondary | ICD-10-CM | POA: Diagnosis not present

## 2016-06-28 DIAGNOSIS — Z87891 Personal history of nicotine dependence: Secondary | ICD-10-CM | POA: Diagnosis not present

## 2016-06-28 DIAGNOSIS — K219 Gastro-esophageal reflux disease without esophagitis: Secondary | ICD-10-CM | POA: Diagnosis not present

## 2016-06-28 DIAGNOSIS — Z923 Personal history of irradiation: Secondary | ICD-10-CM | POA: Diagnosis not present

## 2016-06-28 LAB — CBC
HEMATOCRIT: 44 % (ref 39.0–52.0)
Hemoglobin: 15.4 g/dL (ref 13.0–17.0)
MCH: 29.5 pg (ref 26.0–34.0)
MCHC: 35 g/dL (ref 30.0–36.0)
MCV: 84.3 fL (ref 78.0–100.0)
Platelets: 218 10*3/uL (ref 150–400)
RBC: 5.22 MIL/uL (ref 4.22–5.81)
RDW: 13.5 % (ref 11.5–15.5)
WBC: 5.7 10*3/uL (ref 4.0–10.5)

## 2016-06-28 LAB — COMPREHENSIVE METABOLIC PANEL
ALBUMIN: 4.4 g/dL (ref 3.5–5.0)
ALT: 37 U/L (ref 17–63)
ANION GAP: 9 (ref 5–15)
AST: 23 U/L (ref 15–41)
Alkaline Phosphatase: 74 U/L (ref 38–126)
BILIRUBIN TOTAL: 0.6 mg/dL (ref 0.3–1.2)
BUN: 19 mg/dL (ref 6–20)
CO2: 23 mmol/L (ref 22–32)
Calcium: 9.5 mg/dL (ref 8.9–10.3)
Chloride: 108 mmol/L (ref 101–111)
Creatinine, Ser: 1.43 mg/dL — ABNORMAL HIGH (ref 0.61–1.24)
GFR calc non Af Amer: 52 mL/min — ABNORMAL LOW (ref >60)
GLUCOSE: 195 mg/dL — AB (ref 65–99)
POTASSIUM: 3.9 mmol/L (ref 3.5–5.1)
Sodium: 140 mmol/L (ref 135–145)
TOTAL PROTEIN: 7.2 g/dL (ref 6.5–8.1)

## 2016-06-28 LAB — PROTIME-INR
INR: 0.98
Prothrombin Time: 13 seconds (ref 11.4–15.2)

## 2016-06-28 LAB — APTT: APTT: 26 s (ref 24–36)

## 2016-06-28 NOTE — Telephone Encounter (Signed)
CALLED PATIENT TO REMIND OF IMPLANT FOR 06-29-16, LVM FOR A RETURN CALL

## 2016-06-29 ENCOUNTER — Ambulatory Visit (HOSPITAL_BASED_OUTPATIENT_CLINIC_OR_DEPARTMENT_OTHER)
Admission: RE | Admit: 2016-06-29 | Discharge: 2016-06-29 | Disposition: A | Discharge status: Home / Self Care | Payer: Managed Care, Other (non HMO) | Source: Ambulatory Visit | Attending: Urology | Admitting: Urology

## 2016-06-29 ENCOUNTER — Encounter (HOSPITAL_BASED_OUTPATIENT_CLINIC_OR_DEPARTMENT_OTHER)
Admission: RE | Disposition: A | Discharge status: Home / Self Care | Payer: Self-pay | Source: Ambulatory Visit | Attending: Urology

## 2016-06-29 ENCOUNTER — Ambulatory Visit (HOSPITAL_COMMUNITY): Payer: Managed Care, Other (non HMO)

## 2016-06-29 ENCOUNTER — Ambulatory Visit (HOSPITAL_BASED_OUTPATIENT_CLINIC_OR_DEPARTMENT_OTHER): Payer: Managed Care, Other (non HMO) | Admitting: Certified Registered"

## 2016-06-29 ENCOUNTER — Encounter (HOSPITAL_BASED_OUTPATIENT_CLINIC_OR_DEPARTMENT_OTHER): Payer: Self-pay | Admitting: *Deleted

## 2016-06-29 DIAGNOSIS — Z01818 Encounter for other preprocedural examination: Secondary | ICD-10-CM

## 2016-06-29 DIAGNOSIS — Z85828 Personal history of other malignant neoplasm of skin: Secondary | ICD-10-CM | POA: Insufficient documentation

## 2016-06-29 DIAGNOSIS — C61 Malignant neoplasm of prostate: Secondary | ICD-10-CM | POA: Insufficient documentation

## 2016-06-29 DIAGNOSIS — K219 Gastro-esophageal reflux disease without esophagitis: Secondary | ICD-10-CM | POA: Insufficient documentation

## 2016-06-29 DIAGNOSIS — Z79899 Other long term (current) drug therapy: Secondary | ICD-10-CM | POA: Insufficient documentation

## 2016-06-29 DIAGNOSIS — Z87891 Personal history of nicotine dependence: Secondary | ICD-10-CM | POA: Insufficient documentation

## 2016-06-29 DIAGNOSIS — Z923 Personal history of irradiation: Secondary | ICD-10-CM | POA: Insufficient documentation

## 2016-06-29 HISTORY — DX: Unspecified malignant neoplasm of skin of unspecified part of face: C44.300

## 2016-06-29 HISTORY — PX: CYSTOSCOPY: SHX5120

## 2016-06-29 HISTORY — DX: Other acquired deformity of head: M95.2

## 2016-06-29 HISTORY — DX: Other specified postprocedural states: Z98.890

## 2016-06-29 HISTORY — PX: RADIOACTIVE SEED IMPLANT: SHX5150

## 2016-06-29 HISTORY — DX: Diaphragmatic hernia without obstruction or gangrene: K44.9

## 2016-06-29 SURGERY — INSERTION, RADIATION SOURCE, PROSTATE
Anesthesia: General | Site: Prostate

## 2016-06-29 MED ORDER — PROPOFOL 10 MG/ML IV BOLUS
INTRAVENOUS | Status: DC | PRN
  Administered 2016-06-29: 200 mg via INTRAVENOUS

## 2016-06-29 MED ORDER — SODIUM CHLORIDE 0.9 % IV SOLN
Freq: Once | INTRAVENOUS | Status: AC
  Administered 2016-06-29: 98 mL via INTRAVENOUS
  Filled 2016-06-29: qty 50

## 2016-06-29 MED ORDER — LIDOCAINE 2% (20 MG/ML) 5 ML SYRINGE
INTRAMUSCULAR | Status: DC | PRN
  Administered 2016-06-29: 100 mg via INTRAVENOUS

## 2016-06-29 MED ORDER — LIDOCAINE 2% (20 MG/ML) 5 ML SYRINGE
INTRAMUSCULAR | Status: AC
Start: 2016-06-29 — End: 2016-06-29
  Filled 2016-06-29: qty 5

## 2016-06-29 MED ORDER — MIDAZOLAM HCL 5 MG/5ML IJ SOLN
INTRAMUSCULAR | Status: DC | PRN
  Administered 2016-06-29: 2 mg via INTRAVENOUS

## 2016-06-29 MED ORDER — PROPOFOL 10 MG/ML IV BOLUS
INTRAVENOUS | Status: AC
  Filled 2016-06-29: qty 20

## 2016-06-29 MED ORDER — CEFAZOLIN SODIUM-DEXTROSE 2-4 GM/100ML-% IV SOLN
INTRAVENOUS | Status: AC
  Filled 2016-06-29: qty 100

## 2016-06-29 MED ORDER — CEFAZOLIN SODIUM-DEXTROSE 2-4 GM/100ML-% IV SOLN
2.0000 g | Freq: Once | INTRAVENOUS | Status: AC
  Administered 2016-06-29: 2 g via INTRAVENOUS
  Filled 2016-06-29: qty 100

## 2016-06-29 MED ORDER — CEPHALEXIN 500 MG PO CAPS: 500.0000 mg | ORAL_CAPSULE | Freq: Two times a day (BID) | ORAL | 0 refills | Status: DC

## 2016-06-29 MED ORDER — FENTANYL CITRATE (PF) 100 MCG/2ML IJ SOLN
INTRAMUSCULAR | Status: AC
  Filled 2016-06-29: qty 2

## 2016-06-29 MED ORDER — FENTANYL CITRATE (PF) 100 MCG/2ML IJ SOLN
25.0000 ug | INTRAMUSCULAR | Status: DC | PRN
  Filled 2016-06-29: qty 1

## 2016-06-29 MED ORDER — DEXMEDETOMIDINE BOLUS VIA INFUSION
1.0000 ug/kg | Freq: Once | INTRAVENOUS | Status: DC
  Filled 2016-06-29: qty 98

## 2016-06-29 MED ORDER — IOHEXOL 300 MG/ML  SOLN
INTRAMUSCULAR | Status: DC | PRN
Start: 2016-06-29 — End: 2016-06-29
  Administered 2016-06-29: 7 mL

## 2016-06-29 MED ORDER — ONDANSETRON HCL 4 MG/2ML IJ SOLN
INTRAMUSCULAR | Status: AC
  Filled 2016-06-29: qty 2

## 2016-06-29 MED ORDER — ONDANSETRON HCL 4 MG/2ML IJ SOLN
INTRAMUSCULAR | Status: DC | PRN
  Administered 2016-06-29: 4 mg via INTRAVENOUS

## 2016-06-29 MED ORDER — CIPROFLOXACIN IN D5W 400 MG/200ML IV SOLN
400.0000 mg | INTRAVENOUS | Status: DC
  Filled 2016-06-29: qty 200

## 2016-06-29 MED ORDER — LACTATED RINGERS IV SOLN
INTRAVENOUS | Status: DC
  Administered 2016-06-29 (×2): via INTRAVENOUS
  Filled 2016-06-29: qty 1000

## 2016-06-29 MED ORDER — TRAMADOL HCL 50 MG PO TABS: 50.0000 mg | ORAL_TABLET | Freq: Four times a day (QID) | ORAL | 0 refills | Status: DC | PRN

## 2016-06-29 MED ORDER — FENTANYL CITRATE (PF) 100 MCG/2ML IJ SOLN
INTRAMUSCULAR | Status: DC | PRN
  Administered 2016-06-29 (×2): 25 ug via INTRAVENOUS
  Administered 2016-06-29: 50 ug via INTRAVENOUS

## 2016-06-29 MED ORDER — DEXAMETHASONE SODIUM PHOSPHATE 4 MG/ML IJ SOLN
INTRAMUSCULAR | Status: DC | PRN
  Administered 2016-06-29: 10 mg via INTRAVENOUS

## 2016-06-29 MED ORDER — FLEET ENEMA 7-19 GM/118ML RE ENEM
1.0000 | ENEMA | Freq: Once | RECTAL | Status: DC
  Filled 2016-06-29: qty 1

## 2016-06-29 MED ORDER — MIDAZOLAM HCL 2 MG/2ML IJ SOLN
INTRAMUSCULAR | Status: AC
  Filled 2016-06-29: qty 2

## 2016-06-29 MED ORDER — SODIUM CHLORIDE 0.9 % IV SOLN: 98.0000 ug | Freq: Once | INTRAVENOUS | Status: DC

## 2016-06-29 MED ORDER — DEXAMETHASONE SODIUM PHOSPHATE 10 MG/ML IJ SOLN
INTRAMUSCULAR | Status: AC
  Filled 2016-06-29: qty 1

## 2016-06-29 SURGICAL SUPPLY — 30 items
BAG URINE DRAINAGE (UROLOGICAL SUPPLIES) ×4 IMPLANT
BLADE CLIPPER SURG (BLADE) ×4 IMPLANT
CATH FOLEY 2WAY SLVR  5CC 16FR (CATHETERS) ×2
CATH FOLEY 2WAY SLVR 5CC 16FR (CATHETERS) ×2 IMPLANT
CATH ROBINSON RED A/P 20FR (CATHETERS) ×4 IMPLANT
CLOTH BEACON ORANGE TIMEOUT ST (SAFETY) ×4 IMPLANT
COVER BACK TABLE 60X90IN (DRAPES) ×4 IMPLANT
COVER MAYO STAND STRL (DRAPES) ×4 IMPLANT
DRSG TEGADERM 4X4.75 (GAUZE/BANDAGES/DRESSINGS) ×4 IMPLANT
DRSG TEGADERM 8X12 (GAUZE/BANDAGES/DRESSINGS) ×4 IMPLANT
GLOVE BIO SURGEON STRL SZ7.5 (GLOVE) IMPLANT
GLOVE BIO SURGEON STRL SZ8 (GLOVE) ×8 IMPLANT
GLOVE ECLIPSE 8.0 STRL XLNG CF (GLOVE) IMPLANT
GLOVE INDICATOR 7.0 STRL GRN (GLOVE) ×4 IMPLANT
GOWN STRL REUS W/ TWL LRG LVL3 (GOWN DISPOSABLE) ×2 IMPLANT
GOWN STRL REUS W/ TWL XL LVL3 (GOWN DISPOSABLE) ×2 IMPLANT
GOWN STRL REUS W/TWL LRG LVL3 (GOWN DISPOSABLE) ×2
GOWN STRL REUS W/TWL XL LVL3 (GOWN DISPOSABLE) ×2
HOLDER FOLEY CATH W/STRAP (MISCELLANEOUS) ×4 IMPLANT
IV NS 1000ML (IV SOLUTION) ×2
IV NS 1000ML BAXH (IV SOLUTION) ×2 IMPLANT
KIT RM TURNOVER CYSTO AR (KITS) ×4 IMPLANT
Nucletron selectseed I-125 ×4 IMPLANT
PACK CYSTO (CUSTOM PROCEDURE TRAY) ×4 IMPLANT
SYRINGE 10CC LL (SYRINGE) ×4 IMPLANT
TUBE CONNECTING 12'X1/4 (SUCTIONS)
TUBE CONNECTING 12X1/4 (SUCTIONS) IMPLANT
UNDERPAD 30X30 INCONTINENT (UNDERPADS AND DIAPERS) ×8 IMPLANT
WATER STERILE IRR 3000ML UROMA (IV SOLUTION) IMPLANT
WATER STERILE IRR 500ML POUR (IV SOLUTION) ×4 IMPLANT

## 2016-06-29 NOTE — Transfer of Care (Signed)
Immediate Anesthesia Transfer of Care Note  Patient: [name]  Procedure(s) Performed: Procedure(s) (LRB): RADIOACTIVE SEED IMPLANT/BRACHYTHERAPY IMPLANT (N/A) CYSTOSCOPY FLEXIBLE (N/A)  Patient Location: PACU  Anesthesia Type: General  Level of Consciousness: awake, oriented, sedated and patient cooperative  Airway & Oxygen Therapy: Patient Spontanous Breathing and Patient connected to face mask oxygen  Post-op Assessment: Report given to PACU RN and Post -op Vital signs reviewed and stable  Post vital signs: Reviewed and stable  Complications: No apparent anesthesia complications  Last Vitals:  Vitals:   06/29/16 0943 06/29/16 1332  BP: (!) 158/95 (!) (P) 92/59  Pulse: 67   Resp: 16 (P) 15  Temp: 36.7 C (P) 36.4 C

## 2016-06-29 NOTE — Op Note (Signed)
Preoperative diagnosis: Clinical stage TI C adenocarcinoma the prostate   Postoperative diagnosis: Same   Procedure: I-125 prostate seed implantation with Nucletron robotic implanter   Surgeon: Lillette Boxer. Zackry Deines M.D.  Radiation Oncologist: Tammi Klippel  Anesthesia: Gen.   Indications: Patient  was diagnosed with clinical stage TI C prostate cancer. We had extensive discussion with him about treatment options versus. He elected to proceed with seed implantation. He underwent consultation my office as well as with Dr. Tammi Klippel. He appeared to understand the advantages disadvantages potential risks of this treatment option. Full informed consent has been obtained.   Technique and findings: Patient was brought the operating room where he had successful induction of general anesthesia. He was placed in dorso-lithotomy position and prepped and draped in usual manner. Appropriate surgical timeout was performed. Radiation oncology department placed a transrectal ultrasound probe anchoring stand. Foley catheter with contrast in the balloon was inserted without difficulty. Anchoring needles were placed within the prostate. Rectal tube was placed. Real-time contouring of the urethra prostate and rectum were performed and the dosing parameters were established. Targeted dose was 145.00 gray.  I was then called  to the operating suite suite for placement of the needles. A second timeout was performed. All needle passage was done with real-time transrectal ultrasound guidance with the sagittal plane. A total of 24 needles were placed. The implantation itself was done with the robotic implanter. 86 active seeds were implanted. The Foley catheter was removed and flexible cystoscopy failed to show any seeds outside the prostate.  The patient was brought to recovery room in stable condition, having tolerated the procedure well.Marland Kitchen

## 2016-06-29 NOTE — Interval H&P Note (Signed)
History and Physical Interval Note:  06/29/2016 11:26 AM  [name]  has presented today for surgery, with the diagnosis of PROSTATE CANCER  The various methods of treatment have been discussed with the patient and family. After consideration of risks, benefits and other options for treatment, the patient has consented to  Procedure(s): RADIOACTIVE SEED IMPLANT/BRACHYTHERAPY IMPLANT (N/A) as a surgical intervention .  The patient's history has been reviewed, patient examined, no change in status, stable for surgery.  I have reviewed the patient's chart and labs.  Questions were answered to the patient's satisfaction.     [name]

## 2016-06-29 NOTE — Progress Notes (Signed)
  Radiation Oncology         [phone] ________________________________  Name: [name] MRN: [id number]  Date: 06/29/2016  DOB: [birth year]       Prostate Seed Implant  [id number] BUTLER, DO  No ref. provider found  DIAGNOSIS: 61 y.o. 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 Pre-op testing V72.84 Z01.818 DG Chest 2 View     DG Chest 2 View    PROCEDURE: Insertion of radioactive I-125 seeds into the prostate gland.  RADIATION DOSE: 145 Gy, definitive therapy.  TECHNIQUE: [name] was brought to the operating room with the urologist. He was placed in the dorsolithotomy position. He was catheterized and a rectal tube was inserted. The perineum was shaved, prepped and draped. The ultrasound probe was then introduced into the rectum to see the prostate gland.  TREATMENT DEVICE: A needle grid was attached to the ultrasound probe stand and anchor needles were placed.  3D PLANNING: The prostate was imaged in 3D using a sagittal sweep of the prostate probe. These images were transferred to the planning computer. There, the prostate, urethra and rectum were defined on each axial reconstructed image. Then, the software created an optimized 3D plan and a few seed positions were adjusted. The quality of the plan was reviewed using [facility] information for the target and the following two organs at risk:  Urethra and Rectum.  Then the accepted plan was uploaded to the seed Selectron afterloading unit.  PROSTATE VOLUME STUDY:  Using transrectal ultrasound the volume of the prostate was verified to be 39 cc.  SPECIAL TREATMENT PROCEDURE/SUPERVISION AND HANDLING: The Nucletron FIRST system was used to place the needles under sagittal guidance. A total of 24 needles were used to deposit 86 seeds in the prostate gland. The individual seed activity was 0.359 mCi.  COMPLEX SIMULATION: At the end  of the procedure, an anterior radiograph of the pelvis was obtained to document seed positioning and count. Cystoscopy was performed to check the urethra and bladder.  MICRODOSIMETRY: At the end of the procedure, the patient was emitting 0.157 mR/hr at 1 meter. Accordingly, he was considered safe for hospital discharge.  PLAN: The patient will return to the radiation oncology clinic for post implant CT dosimetry in three weeks.   ________________________________  Sheral Apley Tammi Klippel, M.D.

## 2016-06-29 NOTE — Discharge Instructions (Signed)
°  Post Anesthesia Home Care Instructions ° °Activity: °Get plenty of rest for the remainder of the day. A responsible individual must stay with you for 24 hours following the procedure.  °For the next 24 hours, DO NOT: °-Drive a car °-Operate machinery °-Drink alcoholic beverages °-Take any medication unless instructed by your physician °-Make any legal decisions or sign important papers. ° °Meals: °Start with liquid foods such as gelatin or soup. Progress to regular foods as tolerated. Avoid greasy, spicy, heavy foods. If nausea and/or vomiting occur, drink only clear liquids until the nausea and/or vomiting subsides. Call your physician if vomiting continues. ° °Special Instructions/Symptoms: °Your throat may feel dry or sore from the anesthesia or the breathing tube placed in your throat during surgery. If this causes discomfort, gargle with warm salt water. The discomfort should disappear within 24 hours. ° °If you had a scopolamine patch placed behind your ear for the management of post- operative nausea and/or vomiting: ° °1. The medication in the patch is effective for 72 hours, after which it should be removed.  Wrap patch in a tissue and discard in the trash. Wash hands thoroughly with soap and water. °2. You may remove the patch earlier than 72 hours if you experience unpleasant side effects which may include dry mouth, dizziness or visual disturbances. °3. Avoid touching the patch. Wash your hands with soap and water after contact with the patch. °  ° ° ° ° °Radioactive Seed Implant Home Care Instructions ° ° °Activity:    Rest for the remainder of the day.  Do not drive or operate equipment today.  You may resume normal  activities in a few days as instructed by your physician, without risk of harmful radiation exposure to those around you, provided you follow the time and distance precautions on the Radiation Oncology Instruction Sheet. ° ° °Meals: Drink plenty of lipuids and eat light foods, such as  gelatin or soup this evening .  You may return to normal meal plan tomorrow. ° °Return °To Work: You may return to work as instructed by your physician. ° °Special °Instruction:   If any seeds are found, use tweezers to pick up seeds and place in a glass container of any kind and bring to your physician's office. ° °Call your physician if any of these symptoms occur: ° °· Persistent or heavy bleeding °· Urine stream diminishes or stops completely after catheter is removed °· Fever equal to or greater than 101 degrees F °· Cloudy urine with a strong foul odor °· Severe pain ° °You may feel some burning pain and/or hesitancy when you urinate after the catheter is removed.  These symptoms may increase over the next few weeks, but should diminish within forur to six weeks.  Applying moist heat to the lower abdomen or a hot tub bath may help relieve the pain.  If the discomfort becomes severe, please call your physician for additional medications. ° °

## 2016-06-29 NOTE — Anesthesia Preprocedure Evaluation (Signed)
Anesthesia Evaluation  Patient identified by MRN, date of birth, ID band Patient awake    Reviewed: Allergy & Precautions, H&P , Patient's Chart, lab work & pertinent test results, reviewed documented beta blocker date and time   Airway Mallampati: II  TM Distance: >3 FB Neck ROM: full    Dental no notable dental hx.    Pulmonary former smoker,    Pulmonary exam normal breath sounds clear to auscultation       Cardiovascular  Rhythm:regular Rate:Normal     Neuro/Psych    GI/Hepatic GERD  ,  Endo/Other    Renal/GU      Musculoskeletal   Abdominal   Peds  Hematology   Anesthesia Other Findings   Reproductive/Obstetrics                             Anesthesia Physical Anesthesia Plan  ASA: II  Anesthesia Plan: General   Post-op Pain Management:    Induction: Intravenous  Airway Management Planned: LMA  Additional Equipment:   Intra-op Plan:   Post-operative Plan:   Informed Consent: I have reviewed the patients History and Physical, chart, labs and discussed the procedure including the risks, benefits and alternatives for the proposed anesthesia with the patient or authorized representative who has indicated his/her understanding and acceptance.   Dental Advisory Given  Plan Discussed with: CRNA and Surgeon  Anesthesia Plan Comments: ( )        Anesthesia Quick Evaluation

## 2016-06-29 NOTE — Anesthesia Procedure Notes (Signed)
Procedure Name: LMA Insertion Date/Time: 06/29/2016 11:56 AM Performed by: Denna Haggard D Pre-anesthesia Checklist: Patient identified, Emergency Drugs available, Suction available and Patient being monitored Patient Re-evaluated:Patient Re-evaluated prior to inductionOxygen Delivery Method: Circle system utilized Preoxygenation: Pre-oxygenation with 100% oxygen Intubation Type: IV induction Ventilation: Mask ventilation without difficulty LMA: LMA inserted LMA Size: 4.0 Number of attempts: 1 Airway Equipment and Method: Bite block Placement Confirmation: positive ETCO2 Tube secured with: Tape Dental Injury: Teeth and Oropharynx as per pre-operative assessment

## 2016-06-29 NOTE — H&P (Signed)
Urology History and Physical Exam  CC: prostate cancer  HPI: 61 year old male presents forradiotherapy for treatment of prostate cancer.  He was diagnosedwith prostate cancer in December, 2017.  Multiple cores came back with adenocarcinoma, most with Gleason 3+3 = 6 pattern.  One revealed Gleason 3+4 = 7 adenocarcinoma.  He has chosen to have brachytherapy.  PMH: Past Medical History:  Diagnosis Date  . GERD (gastroesophageal reflux disease)   . Hiatal hernia   . History of esophageal dilatation    05/ 2004  incompleted ring  . Mohs defect of cheek 2011   rt side/ eye to mandible  . Prostate cancer [facility]) urologist-  dr Raymon Schlarb/  oncologist-  dr Tammi Klippel   Stage T1c, Gleason 7, PSA 5.9, vol 27cc  . Skin cancer of face 2011   returned 2013-radiation tx     PSH: Past Surgical History:  Procedure Laterality Date  . COLONOSCOPY    . ESOPHAGOGASTRODUODENOSCOPY (EGD) WITH ESOPHAGEAL DILATION  08/2002  . HEMORRHOID SURGERY    . INCISIONAL HERNIA REPAIR N/A 12/07/2015   Procedure: LAPAROSCOPIC INCISIONAL HERNIA WITH MESH ;  Surgeon: Fanny Skates, MD;  Location: [facility];  Service: General;  Laterality: N/A;  . LAPAROSCOPIC CHOLECYSTECTOMY  2010  . PROSTATE BIOPSY    . SKIN GRAFT  2013,2011   from neck to face on right side/ skin cancer and tumor    Allergies: Allergies  Allergen Reactions  . Azithromycin Anaphylaxis and Hives  . Ciprofloxacin Hives and Anaphylaxis  . Codeine Nausea And Vomiting    Other reaction(s): Agitation    Medications: Prescriptions Prior to Admission  Medication Sig Dispense Refill Last Dose  . gabapentin (NEURONTIN) 100 MG capsule Take 300 mg by mouth.   Past Month at Unknown time  . ibuprofen (ADVIL,MOTRIN) 200 MG tablet Take 200 mg by mouth every 6 (six) hours as needed for moderate pain.   Past Week at Unknown time  . pantoprazole (PROTONIX) 40 MG tablet Take 40 mg by mouth daily.   06/29/2016 at 0530  . nitroGLYCERIN (NITROSTAT) 0.3 MG SL tablet  Place 0.3 mg under the tongue every 5 (five) minutes as needed for chest pain.   Unknown at Unknown time     Social History: Social History   Social History  . Marital status: Married    Spouse name: N/A  . Number of children: N/A  . Years of education: N/A   Occupational History  . Not on file.   Social History Main Topics  . Smoking status: Former Smoker    Packs/day: 0.50    Years: 10.00    Types: Cigarettes    Quit date: 04/09/1978  . Smokeless tobacco: Never Used     Comment: QUIT SMOKING IN THE 1980'S  . Alcohol use No  . Drug use: No  . Sexual activity: Yes   Other Topics Concern  . Not on file   Social History Narrative  . No narrative on file    Family History: Family History  Problem Relation Age of Onset  . Cancer Mother     colon    Review of Systems: GU Review Male: Patient denies frequent urination, hard to postpone urination, burning/ pain with urination, get up at night to urinate, leakage of urine, stream starts and stops, trouble starting your stream, have to strain to urinate , erection problems, and penile pain. Gastrointestinal (Upper): Patient reports indigestion/ heartburn. Patient denies nausea and vomiting. Gastrointestinal (Lower): Patient denies diarrhea and constipation. Constitutional: Patient  denies fever, night sweats, weight loss, and fatigue. Skin: Patient denies skin rash/ lesion and itching. Eyes: Patient denies blurred vision and double vision. Ears/ Nose/ Throat: Patient reports sinus problems. Patient denies sore throat. Hematologic/Lymphatic: Patient denies swollen glands and easy bruising. Cardiovascular: Patient denies leg swelling and chest pains. Respiratory: Patient denies cough and shortness of breath. Endocrine: Patient denies excessive thirst. Musculoskeletal: Patient denies back pain and joint pain. Neurological: Patient denies headaches and dizziness. Psychologic: Patient denies depression and anxiety.                  Physical Exam: @VITALS2 @ General: No acute distress.  Awake. Head:  Normocephalic.  Atraumatic. ENT:  EOMI.  Mucous membranes moist Neck:  Supple.  No lymphadenopathy. CV:  S1 present. S2 present. Regular rate. Pulmonary: Equal effort bilaterally.  Clear to auscultation bilaterally. Abdomen: Soft.  Non- tender to palpation. Skin:  Normal turgor.  No visible rash. Extremity: No gross deformity of bilateral upper extremities.  No gross deformity of                             lower extremities. Neurologic: Alert. Appropriate mood.   Studies:  Recent Labs     06/28/16  1339  HGB  15.4  WBC  5.7  PLT  218    Recent Labs     06/28/16  1339  NA  140  K  3.9  CL  108  CO2  23  BUN  19  CREATININE  1.43*  CALCIUM  9.5  GFRNONAA  52*  GFRAA  >60     Recent Labs     06/28/16  1339  INR  0.98  APTT  26     Invalid input(s): ABG    Assessment:  Adenocarcinoma prostate  Plan: I-125 brachytherapy

## 2016-07-01 NOTE — Anesthesia Postprocedure Evaluation (Signed)
Anesthesia Post Note  Patient: [name]  Procedure(s) Performed: Procedure(s) (LRB): RADIOACTIVE SEED IMPLANT/BRACHYTHERAPY IMPLANT (N/A) CYSTOSCOPY FLEXIBLE (N/A)  Patient location during evaluation: PACU Anesthesia Type: General Level of consciousness: sedated Pain management: satisfactory to patient Vital Signs Assessment: post-procedure vital signs reviewed and stable Respiratory status: spontaneous breathing Cardiovascular status: stable Anesthetic complications: no       Last Vitals:  Vitals:   06/29/16 1500 06/29/16 1540  BP: 112/78 117/78  Pulse: 63 65  Resp: 11 18  Temp:  36.6 C    Last Pain:  Vitals:   06/29/16 1519  TempSrc:   PainSc: 0-No pain                 Edana Aguado EDWARD

## 2016-07-02 ENCOUNTER — Encounter (HOSPITAL_BASED_OUTPATIENT_CLINIC_OR_DEPARTMENT_OTHER): Payer: Self-pay | Admitting: Urology

## 2016-07-19 ENCOUNTER — Telehealth: Payer: Self-pay | Admitting: *Deleted

## 2016-07-19 NOTE — Telephone Encounter (Signed)
CALLED PATIENT TO REMIND OF POST SEED APPTS. FOR 07-20-16, SPOKE WITH PATIENT'S WIFE - JULIE AND SHE IS AWARE OF THESE APPTS.

## 2016-07-20 ENCOUNTER — Ambulatory Visit
Admission: RE | Admit: 2016-07-20 | Discharge: 2016-07-20 | Disposition: A | Discharge status: Home / Self Care | Payer: Managed Care, Other (non HMO) | Source: Ambulatory Visit | Attending: Radiation Oncology | Admitting: Radiation Oncology

## 2016-07-20 ENCOUNTER — Encounter: Payer: Self-pay | Admitting: Radiation Oncology

## 2016-07-20 ENCOUNTER — Other Ambulatory Visit: Payer: Self-pay | Admitting: Urology

## 2016-07-20 VITALS — BP 150/88 | HR 77 | Temp 98.7°F | Resp 18 | Ht 68.0 in | Wt 222.4 lb

## 2016-07-20 DIAGNOSIS — C61 Malignant neoplasm of prostate: Secondary | ICD-10-CM

## 2016-07-20 DIAGNOSIS — Z510 Encounter for antineoplastic radiation therapy: Secondary | ICD-10-CM | POA: Diagnosis not present

## 2016-07-20 DIAGNOSIS — R350 Frequency of micturition: Secondary | ICD-10-CM

## 2016-07-20 MED ORDER — OXYBUTYNIN CHLORIDE 5 MG PO TABS: 5.0000 mg | ORAL_TABLET | Freq: Three times a day (TID) | ORAL | 1 refills | Status: DC | PRN

## 2016-07-20 NOTE — Progress Notes (Signed)
Weight and vital signs are stable.  He  currently is having pain 3/10 to his penis and rectum taking Advil.    URINARY: He  reports urinary frequency,urinary urgency,having burning when he starts to urinate and after he voids he still has the burning sensation.  Denies odor to urine or  Hematuria.  Reports he is emptying his bladder but he does not feel like like the bladder is empty.  He had a ultrasound of the bladder today  [facility] he is [facility]ying his bladder .Marland Kitchen Pt states he gets up to urinate 10  times per night. BOWEL: He reports a  bowel movement everyday with flatus and pressure, normal bowel movements.  Denies diarrhea. Fatigue:Yes in the afternoon feel the fatigue. Appetite:Good eating three meals per day. Weight: Wt Readings from Last 3 Encounters:  07/20/16 222 lb 6.4 oz (100.9 kg)  06/29/16 216 lb (98 kg)  04/30/16 225 lb 9.6 oz (102.3 kg)   Urologist: Dr.Dahlstedt: BP (!) 150/88   Pulse 77   Temp 98.7 F (37.1 C) (Oral)   Resp 18   Ht 5\' 8"  (1.727 m)   Wt 222 lb 6.4 oz (100.9 kg)   SpO2 99%   BMI 33.82 kg/m

## 2016-07-20 NOTE — Progress Notes (Signed)
Radiation Oncology         (336) [phone] ________________________________  Name: [name] MRN: [id number]  Date: 07/20/2016  DOB: Aug 27, 1955  Follow-Up Visit Note  CC: CYNTHIA BUTLER, DO  Franchot Gallo, MD  Diagnosis:   61 y.o. 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 Malignant neoplasm of prostate ([place]) 185 C61   2. Frequency of urination 788.41 R35.0 oxybutynin (DITROPAN) 5 MG tablet    Interval Since Last Radiation:  3 weeks  Narrative:  The patient returns today for routine follow-up.  He is complaining of increased urinary frequency and urinary hesitation symptoms. He filled out a questionnaire regarding urinary function today providing and overall IPSS score of 33 characterizing his symptoms as severe.  His pre-implant score was 5.  On review of systems, the patient reports that he is doing fair. He reports significant rectal pressure with frequent urge to have a BM.  However, he is only having 2-3 BMs/day and mostly passing flatus with small ammounts of greasy residue.   He reports urinary frequency, urgency, nocturia, feeling of incomplete emptying, intermittency, burning with initiation of urine stream and residual burning in the penis at completion of urination. He denies odor to urine or gross hematuria. He had a PVR ultrasound of the bladder today at [facility] which confirmed that he is indeed emptying his bladder. He reports nocturia x 10.  Weight and vitals stable. He reports afternoon fatigue. He denies any chest pain, shortness of breath, cough, fevers, chills, night sweats, unintended weight changes. He denies abdominal pain, nausea or vomiting. He denies any new musculoskeletal or joint aches or pains, new skin lesions or concerns. A complete review of systems is obtained and is otherwise negative.   ALLERGIES:  is allergic to azithromycin;  ciprofloxacin; and codeine.  Meds: Current Outpatient Prescriptions  Medication Sig Dispense Refill  . gabapentin (NEURONTIN) 100 MG capsule Take 300 mg by mouth.    Marland Kitchen ibuprofen (ADVIL,MOTRIN) 200 MG tablet Take 200 mg by mouth every 6 (six) hours as needed for moderate pain.    . pantoprazole (PROTONIX) 40 MG tablet Take 40 mg by mouth daily.    . nitroGLYCERIN (NITROSTAT) 0.3 MG SL tablet Place 0.3 mg under the tongue every 5 (five) minutes as needed for chest pain.    Marland Kitchen oxybutynin (DITROPAN) 5 MG tablet Take 1 tablet (5 mg total) by mouth every 8 (eight) hours as needed for bladder spasms. 90 tablet 1  . traMADol (ULTRAM) 50 MG tablet Take 1 tablet (50 mg total) by mouth every 6 (six) hours as needed. (Patient not taking: Reported on 07/20/2016) 15 tablet 0   No current facility-administered medications for this encounter.     Physical Findings:  height is 5\' 8"  (1.727 m) and weight is 222 lb 6.4 oz (100.9 kg). His oral temperature is 98.7 F (37.1 C). His blood pressure is 150/88 (abnormal) and his pulse is 77. His respiration is 18 and oxygen saturation is 99%. .   In general this is a well appearing caucasian male in no acute distress. He's alert and oriented x4 and appropriate throughout the examination. Cardiopulmonary assessment is negative for acute distress and he exhibits normal effort.    Lab Findings: Lab Results  Component Value Date   WBC 5.7 06/28/2016   HGB 15.4 06/28/2016   HCT 44.0 06/28/2016   MCV  84.3 06/28/2016   PLT 218 06/28/2016    Radiographic Findings:  Patient underwent CT imaging in our clinic for post implant dosimetry. The CT appears to demonstrate an adequate distribution of radioactive seeds throughout the prostate gland. There no seeds in her near the rectum. We suspect the final radiation plan and dosimetry will show appropriate coverage of the prostate gland.   Impression: The patient is recovering from the effects of radiation. His urinary  symptoms should gradually improve over the next 4-6 months. We talked about this today. He has some Uribel samples to help with frequency and dysuria.  He will try these over the weekend to see if these make his sxs more manageable.  I also provided a Rx for Oxybutynin 5mg  to use TID prn bladder spasms if the Uribel does not provide significant relief in the urgency/frequency.  He is encouraged by our conversation and is otherwise pleased with his outcome.   Plan: Today, we spent time talking to the patient about his prostate seed implant and resolving urinary symptoms. We also talked about long-term follow-up for prostate cancer following seed implant. He understands that ongoing PSA determinations and digital rectal exams will help perform surveillance to rule out disease recurrence. He understands what to expect with his PSA measures. Patient was also educated today about some of the long-term effects from radiation including a small risk for rectal bleeding and possibly erectile dysfunction. We talked about some of the general management approaches to these potential complications. However, we did encourage the patient to contact our office or return at any point if he has questions or concerns related to his previous radiation and prostate cancer.    Nicholos Johns, PA-C    Tyler Pita, MD  [facility] Direct Dial: [phone]  Fax: [phone] [email or web address]  Skype  LinkedIn  This document serves as a record of services personally performed by Tyler Pita, MD and Freeman Caldron, PA-C. It was created on their behalf by Arlyce Harman, a trained medical scribe. The creation of this record is based on the scribe's personal observations and the provider's statements to them. This document has been checked and approved by the attending provider.

## 2016-07-20 NOTE — Progress Notes (Signed)
  Radiation Oncology         (336) [phone] ________________________________  Name: [name] MRN: [id number]  Date: 07/20/2016  DOB: 11-11-1955  COMPLEX SIMULATION NOTE  NARRATIVE:  The patient was brought to the [facility] today following prostate seed implantation approximately one month ago.  Identity was confirmed.  All relevant records and images related to the planned course of therapy were reviewed.  Then, the patient was set-up supine.  CT images were obtained.  The CT images were loaded into the planning software.  Then the prostate and rectum were contoured.  Treatment planning then occurred.  The implanted iodine 125 seeds were identified by the physics staff for projection of radiation distribution  I have requested : 3D Simulation  I have requested a DVH of the following structures: Prostate and rectum.    ________________________________  Sheral Apley Tammi Klippel, M.D.  This document serves as a record of services personally performed by Tyler Pita, MD. It was created on his behalf by Arlyce Harman, a trained medical scribe. The creation of this record is based on the scribe's personal observations and the provider's statements to them. This document has been checked and approved by the attending provider.

## 2016-07-23 ENCOUNTER — Encounter: Payer: Self-pay | Admitting: Radiation Oncology

## 2016-07-23 DIAGNOSIS — Z510 Encounter for antineoplastic radiation therapy: Secondary | ICD-10-CM | POA: Diagnosis not present

## 2016-07-29 NOTE — Progress Notes (Signed)
  Radiation Oncology         [phone] ________________________________  Name: [name] MRN: [id number]  Date: 07/23/2016  DOB: Apr 22, 1955  3D Planning Note   Prostate Brachytherapy Post-Implant Dosimetry  Diagnosis: 61 y.o. 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 He underwent CT scan complex simulation to delineate the three-dimensional structures of the pelvis and demonstrate the radiation distribution.  Since that time, the seed localization, and complex isodose planning with dose volume histograms have now been completed.  Results:   Prostate Coverage - The dose of radiation delivered to the 90% or more of the prostate gland (D90) was 109.44% of the prescription dose. This exceeds our goal of greater than 90%. Rectal Sparing - The volume of rectal tissue receiving the prescription dose or higher was 0.09 cc. This falls under our thresholds tolerance of 1.0 cc.  Impression: The prostate seed implant appears to show adequate target coverage and appropriate rectal sparing.  Plan:  The patient will continue to follow with urology for ongoing PSA determinations. I would anticipate a high likelihood for local tumor control with minimal risk for rectal morbidity.  ________________________________  Sheral Apley Tammi Klippel, M.D.

## 2016-10-19 NOTE — Anesthesia Postprocedure Evaluation (Signed)
Anesthesia Post Note  Patient: [name]  Procedure(s) Performed: Procedure(s) (LRB): RADIOACTIVE SEED IMPLANT/BRACHYTHERAPY IMPLANT (N/A) CYSTOSCOPY FLEXIBLE (N/A)     Anesthesia Post Evaluation  Last Vitals:  Vitals:   06/29/16 1500 06/29/16 1540  BP: 112/78 117/78  Pulse: 63 65  Resp: 11 18  Temp:  36.6 C    Last Pain:  Vitals:   07/02/16 1127  TempSrc:   PainSc: 0-No pain                 Jose Alleyne EDWARD

## 2016-10-19 NOTE — Addendum Note (Signed)
Addendum  created 10/19/16 1405 by Silena Wyss, MD   Sign clinical note    

## 2018-01-24 DIAGNOSIS — R739 Hyperglycemia, unspecified: Secondary | ICD-10-CM | POA: Insufficient documentation

## 2018-01-24 DIAGNOSIS — R634 Abnormal weight loss: Secondary | ICD-10-CM | POA: Insufficient documentation

## 2018-01-24 DIAGNOSIS — R350 Frequency of micturition: Secondary | ICD-10-CM | POA: Diagnosis not present

## 2018-01-24 DIAGNOSIS — Z8546 Personal history of malignant neoplasm of prostate: Secondary | ICD-10-CM | POA: Insufficient documentation

## 2018-01-24 DIAGNOSIS — E860 Dehydration: Secondary | ICD-10-CM | POA: Diagnosis not present

## 2018-01-24 DIAGNOSIS — Z7984 Long term (current) use of oral hypoglycemic drugs: Secondary | ICD-10-CM | POA: Diagnosis not present

## 2018-01-24 DIAGNOSIS — Z85828 Personal history of other malignant neoplasm of skin: Secondary | ICD-10-CM | POA: Insufficient documentation

## 2018-01-24 DIAGNOSIS — Z923 Personal history of irradiation: Secondary | ICD-10-CM | POA: Diagnosis not present

## 2018-01-24 DIAGNOSIS — Z79899 Other long term (current) drug therapy: Secondary | ICD-10-CM | POA: Insufficient documentation

## 2018-01-24 DIAGNOSIS — Z87891 Personal history of nicotine dependence: Secondary | ICD-10-CM | POA: Diagnosis not present

## 2018-01-24 DIAGNOSIS — R112 Nausea with vomiting, unspecified: Secondary | ICD-10-CM | POA: Insufficient documentation

## 2018-01-25 ENCOUNTER — Emergency Department (HOSPITAL_COMMUNITY)
Admission: EM | Admit: 2018-01-25 | Discharge: 2018-01-25 | Disposition: A | Discharge status: Home / Self Care | Payer: 59 | Attending: Emergency Medicine | Admitting: Emergency Medicine

## 2018-01-25 ENCOUNTER — Other Ambulatory Visit: Payer: Self-pay

## 2018-01-25 ENCOUNTER — Encounter (HOSPITAL_COMMUNITY): Payer: Self-pay | Admitting: Emergency Medicine

## 2018-01-25 DIAGNOSIS — R739 Hyperglycemia, unspecified: Secondary | ICD-10-CM

## 2018-01-25 DIAGNOSIS — E860 Dehydration: Secondary | ICD-10-CM

## 2018-01-25 LAB — BASIC METABOLIC PANEL
ANION GAP: 9 (ref 5–15)
Anion gap: 13 (ref 5–15)
BUN: 27 mg/dL — AB (ref 8–23)
BUN: 32 mg/dL — AB (ref 8–23)
CALCIUM: 10.2 mg/dL (ref 8.9–10.3)
CHLORIDE: 106 mmol/L (ref 98–111)
CO2: 22 mmol/L (ref 22–32)
CO2: 23 mmol/L (ref 22–32)
CREATININE: 1.49 mg/dL — AB (ref 0.61–1.24)
CREATININE: 1.79 mg/dL — AB (ref 0.61–1.24)
Calcium: 8.8 mg/dL — ABNORMAL LOW (ref 8.9–10.3)
Chloride: 94 mmol/L — ABNORMAL LOW (ref 98–111)
GFR calc Af Amer: 45 mL/min — ABNORMAL LOW (ref >60)
GFR calc non Af Amer: 49 mL/min — ABNORMAL LOW (ref >60)
GFR, EST AFRICAN AMERICAN: 56 mL/min — AB (ref >60)
GFR, EST NON AFRICAN AMERICAN: 39 mL/min — AB (ref >60)
Glucose, Bld: 584 mg/dL (ref 70–99)
Glucose, Bld: 277 mg/dL — ABNORMAL HIGH (ref 70–99)
POTASSIUM: 4.3 mmol/L (ref 3.5–5.1)
Potassium: 3.6 mmol/L (ref 3.5–5.1)
SODIUM: 129 mmol/L — AB (ref 135–145)
SODIUM: 138 mmol/L (ref 135–145)

## 2018-01-25 LAB — CBC
HCT: 47.3 % (ref 39.0–52.0)
Hemoglobin: 16.4 g/dL (ref 13.0–17.0)
MCH: 29.3 pg (ref 26.0–34.0)
MCHC: 34.7 g/dL (ref 30.0–36.0)
MCV: 84.5 fL (ref 80.0–100.0)
PLATELETS: 226 10*3/uL (ref 150–400)
RBC: 5.6 MIL/uL (ref 4.22–5.81)
RDW: 12.9 % (ref 11.5–15.5)
WBC: 6.6 10*3/uL (ref 4.0–10.5)
nRBC: 0 % (ref 0.0–0.2)

## 2018-01-25 LAB — URINALYSIS, ROUTINE W REFLEX MICROSCOPIC
BILIRUBIN URINE: NEGATIVE
Bacteria, UA: NONE SEEN
Hgb urine dipstick: NEGATIVE
KETONES UR: 20 mg/dL — AB
Leukocytes, UA: NEGATIVE
NITRITE: NEGATIVE
PH: 5 (ref 5.0–8.0)
Protein, ur: NEGATIVE mg/dL
Specific Gravity, Urine: 1.027 (ref 1.005–1.030)

## 2018-01-25 LAB — CBG MONITORING, ED
GLUCOSE-CAPILLARY: 352 mg/dL — AB (ref 70–99)
Glucose-Capillary: 274 mg/dL — ABNORMAL HIGH (ref 70–99)
Glucose-Capillary: 409 mg/dL — ABNORMAL HIGH (ref 70–99)
Glucose-Capillary: 480 mg/dL — ABNORMAL HIGH (ref 70–99)
Glucose-Capillary: 575 mg/dL (ref 70–99)

## 2018-01-25 MED ORDER — METFORMIN HCL 500 MG PO TABS
1000.0000 mg | ORAL_TABLET | Freq: Once | ORAL | Status: AC
  Administered 2018-01-25: 1000 mg via ORAL
  Filled 2018-01-25: qty 2

## 2018-01-25 MED ORDER — SODIUM CHLORIDE 0.9 % IV BOLUS
1000.0000 mL | Freq: Once | INTRAVENOUS | Status: AC
  Administered 2018-01-25: 1000 mL via INTRAVENOUS

## 2018-01-25 MED ORDER — METFORMIN HCL 500 MG PO TABS: 500.0000 mg | ORAL_TABLET | Freq: Two times a day (BID) | ORAL | 0 refills | Status: AC

## 2018-01-25 MED ORDER — INSULIN REGULAR(HUMAN) IN NACL 100-0.9 UT/100ML-% IV SOLN
INTRAVENOUS | Status: DC
  Administered 2018-01-25: 100 [IU] via INTRAVENOUS
  Filled 2018-01-25 (×2): qty 100

## 2018-01-25 NOTE — ED Notes (Signed)
Date and time results received: 01/25/18   01:33 Test: glucose Critical Value: 584  Name of Provider Notified: Tomi Bamberger  Orders Received? Or Actions Taken?: Dr. Tomi Bamberger notified

## 2018-01-25 NOTE — ED Triage Notes (Signed)
Pt was seen at [place] office and blood sugar was reading "high" on their machine so pt was sent to [facility] to have bloodwork drawn and they called and told him that his sugar was 697. Pt has been thirsty and having urinary frequency for 2-3 weeks.

## 2018-01-25 NOTE — Discharge Instructions (Addendum)
Look at the diabetic diet information.  Please restart the metformin twice a day.  Please have your doctor's office recheck you this coming week to see how you are doing.  If you get dehydrated again please return to the emergency department.  If possible check your blood sugars over the weekend.

## 2018-01-25 NOTE — ED Notes (Signed)
Called [facility] for insulin

## 2018-01-25 NOTE — ED Provider Notes (Signed)
[facility] EMERGENCY DEPARTMENT Provider Note   CSN: [id number] Arrival date & time: 01/24/18  2347  Time seen 01:25 AM   History   Chief Complaint Chief Complaint  Patient presents with  . Hyperglycemia    HPI [name] is a 62 y.o. male.  HPI patient states for the past few weeks he has been having a dry throat and states he has had radiation therapy to his face before for basal cell carcinoma and had some injury to his salivary gland so he normally has a dry throat but it is worse.  He has been having polydipsia, and polyuria.  He has a history of prostate cancer and has the seeds implanted and normally has urinary frequency but that is been getting much worse.  He states he is urinating every 10 to 15 minutes and at night he is urinating every hour.  His family reports he has lost weight and in the last couple weeks he is gone from 223 pounds down to 201 pounds this morning.  He does not have a history of diabetes but a few months ago his doctor thought his blood sugar was high and started him on metformin, it sounds like they sent an A1C and it was normal and they stopped his metformin.  However last week he started taking Flomax and he restarted the metformin pills but he stopped them 2 days ago.  He has had nausea and vomited once today.  He denies abdominal pain, chest pain, or feeling short of breath.  Family reports he drinks a lot of [facility] but he stopped those a few weeks ago to see if that help.  Patient states his father developed diabetes in his late 85s or early 10s.  PCP [name], DO   Past Medical History:  Diagnosis Date  . GERD (gastroesophageal reflux disease)   . Hiatal hernia   . History of esophageal dilatation    05/ 2004  incompleted ring  . Mohs defect of cheek 2011   rt side/ eye to mandible  . Prostate cancer [facility]) urologist-  dr dahlstedt/  oncologist-  dr Tammi Klippel   Stage T1c, Gleason 7, PSA 5.9, vol 27cc  . Skin cancer of face 2011    returned 2013-radiation tx     Patient Active Problem List   Diagnosis Date Noted  . Malignant neoplasm of prostate ([place]) 04/30/2016  . Incisional hernia, without obstruction or gangrene 12/07/2015  . Incisional hernia with obstruction 12/07/2015    Past Surgical History:  Procedure Laterality Date  . COLONOSCOPY    . CYSTOSCOPY N/A 06/29/2016   Procedure: CYSTOSCOPY FLEXIBLE;  Surgeon: Franchot Gallo, MD;  Location: [facility];  Service: Urology;  Laterality: N/A;  no seeds found in bladder  . ESOPHAGOGASTRODUODENOSCOPY (EGD) WITH ESOPHAGEAL DILATION  08/2002  . HEMORRHOID SURGERY    . INCISIONAL HERNIA REPAIR N/A 12/07/2015   Procedure: LAPAROSCOPIC INCISIONAL HERNIA WITH MESH ;  Surgeon: Fanny Skates, MD;  Location: [place];  Service: General;  Laterality: N/A;  . LAPAROSCOPIC CHOLECYSTECTOMY  2010  . PROSTATE BIOPSY    . RADIOACTIVE SEED IMPLANT N/A 06/29/2016   Procedure: RADIOACTIVE SEED IMPLANT/BRACHYTHERAPY IMPLANT;  Surgeon: Franchot Gallo, MD;  Location: [facility];  Service: Urology;  Laterality: N/A;  86 seeds implanted   . SKIN GRAFT  2013,2011   from neck to face on right side/ skin cancer and tumor        Home Medications  Prior to Admission medications   Medication Sig Start Date End Date Taking? Authorizing Provider  ibuprofen (ADVIL,MOTRIN) 200 MG tablet Take 200 mg by mouth every 6 (six) hours as needed for moderate pain.   Yes [provider]  nitroGLYCERIN (NITROSTAT) 0.3 MG SL tablet Place 0.3 mg under the tongue every 5 (five) minutes as needed for chest pain.   Yes [provider]  pantoprazole (PROTONIX) 40 MG tablet Take 40 mg by mouth daily. 11/17/15  Yes [provider]  traMADol (ULTRAM) 50 MG tablet Take 1 tablet (50 mg total) by mouth every 6 (six) hours as needed. 06/29/16  Yes Dahlstedt, Annie Main, MD  gabapentin (NEURONTIN) 100 MG capsule Take 300 mg by mouth.    [provider]  metFORMIN (GLUCOPHAGE) 500 MG tablet Take 1 tablet (500 mg total) by mouth 2 (two) times daily with a meal. 01/25/18   Rolland Porter, MD  oxybutynin (DITROPAN) 5 MG tablet Take 1 tablet (5 mg total) by mouth every 8 (eight) hours as needed for bladder spasms. 07/20/16   Bruning, Ailene Ards, PA-C    Family History Family History  Problem Relation Age of Onset  . Cancer Mother        colon    Social History Social History   Tobacco Use  . Smoking status: Former Smoker    Packs/day: 0.50    Years: 10.00    Pack years: 5.00    Types: Cigarettes    Last attempt to quit: 04/09/1978    Years since quitting: 39.8  . Smokeless tobacco: Never Used  . Tobacco comment: QUIT SMOKING IN THE 1980'S  Substance Use Topics  . Alcohol use: No  . Drug use: No  lives at home   Allergies   Azithromycin; Ciprofloxacin; and Codeine   Review of Systems Review of Systems  All other systems reviewed and are negative.    Physical Exam Updated Vital Signs BP (!) 144/97   Pulse 95   Temp 97.9 F (36.6 C) (Oral)   Resp 18   Ht 5' 8" (1.727 m)   Wt 91.2 kg   SpO2 95%   BMI 30.56 kg/m   Vital signs normal    Physical Exam  Constitutional: He is oriented to person, place, and time. He appears well-developed and well-nourished.  Non-toxic appearance. He does not appear ill. No distress.  HENT:  Head: Normocephalic and atraumatic.  Right Ear: External ear normal.  Left Ear: External ear normal.  Nose: Nose normal. No mucosal edema or rhinorrhea.  Mouth/Throat: Mucous membranes are dry. No dental abscesses or uvula swelling.  Patient appears to have had some skin grafting to the Right side of his nose and on his right cheek area  Eyes: Pupils are equal, round, and reactive to light. Conjunctivae and EOM are normal.  Neck: Normal range of motion and full passive range of motion without pain. Neck supple.  Cardiovascular: Normal rate, regular rhythm and normal heart sounds. Exam  reveals no gallop and no friction rub.  No murmur heard. Pulmonary/Chest: Effort normal and breath sounds normal. No respiratory distress. He has no wheezes. He has no rhonchi. He has no rales. He exhibits no tenderness and no crepitus.  Abdominal: Soft. Normal appearance and bowel sounds are normal. He exhibits no distension. There is no tenderness. There is no rebound and no guarding.  Musculoskeletal: Normal range of motion. He exhibits no edema or tenderness.  Moves all extremities well.   Neurological: He is alert and  oriented to person, place, and time. He has normal strength. No cranial nerve deficit.  Skin: Skin is warm, dry and intact. No rash noted. No erythema. No pallor.  Psychiatric: He has a normal mood and affect. His speech is normal and behavior is normal. His mood appears not anxious.  Nursing note and vitals reviewed.    ED Treatments / Results  Labs (all labs ordered are listed, but only abnormal results are displayed) Results for orders placed or performed during the hospital encounter of 75/10/25  Basic metabolic panel  Result Value Ref Range   Sodium 129 (L) 135 - 145 mmol/L   Potassium 4.3 3.5 - 5.1 mmol/L   Chloride 94 (L) 98 - 111 mmol/L   CO2 22 22 - 32 mmol/L   Glucose, Bld 584 (HH) 70 - 99 mg/dL   BUN 32 (H) 8 - 23 mg/dL   Creatinine, Ser 1.79 (H) 0.61 - 1.24 mg/dL   Calcium 10.2 8.9 - 10.3 mg/dL   GFR calc non Af Amer 39 (L) >60 mL/min   GFR calc Af Amer 45 (L) >60 mL/min   Anion gap 13 5 - 15  CBC  Result Value Ref Range   WBC 6.6 4.0 - 10.5 K/uL   RBC 5.60 4.22 - 5.81 MIL/uL   Hemoglobin 16.4 13.0 - 17.0 g/dL   HCT 47.3 39.0 - 52.0 %   MCV 84.5 80.0 - 100.0 fL   MCH 29.3 26.0 - 34.0 pg   MCHC 34.7 30.0 - 36.0 g/dL   RDW 12.9 11.5 - 15.5 %   Platelets 226 150 - 400 K/uL   nRBC 0.0 0.0 - 0.2 %  Urinalysis, Routine w reflex microscopic  Result Value Ref Range   Color, Urine STRAW (A) YELLOW   APPearance CLEAR CLEAR   Specific Gravity, Urine  1.027 1.005 - 1.030   pH 5.0 5.0 - 8.0   Glucose, UA >=500 (A) NEGATIVE mg/dL   Hgb urine dipstick NEGATIVE NEGATIVE   Bilirubin Urine NEGATIVE NEGATIVE   Ketones, ur 20 (A) NEGATIVE mg/dL   Protein, ur NEGATIVE NEGATIVE mg/dL   Nitrite NEGATIVE NEGATIVE   Leukocytes, UA NEGATIVE NEGATIVE   RBC / HPF 0-5 0 - 5 RBC/hpf   WBC, UA 0-5 0 - 5 WBC/hpf   Bacteria, UA NONE SEEN NONE SEEN  Basic metabolic panel  Result Value Ref Range   Sodium 138 135 - 145 mmol/L   Potassium 3.6 3.5 - 5.1 mmol/L   Chloride 106 98 - 111 mmol/L   CO2 23 22 - 32 mmol/L   Glucose, Bld 277 (H) 70 - 99 mg/dL   BUN 27 (H) 8 - 23 mg/dL   Creatinine, Ser 1.49 (H) 0.61 - 1.24 mg/dL   Calcium 8.8 (L) 8.9 - 10.3 mg/dL   GFR calc non Af Amer 49 (L) >60 mL/min   GFR calc Af Amer 56 (L) >60 mL/min   Anion gap 9 5 - 15  CBG monitoring, ED  Result Value Ref Range   Glucose-Capillary 575 (HH) 70 - 99 mg/dL   Comment 1 Document in Chart   CBG monitoring, ED  Result Value Ref Range   Glucose-Capillary 480 (H) 70 - 99 mg/dL  CBG monitoring, ED  Result Value Ref Range   Glucose-Capillary 409 (H) 70 - 99 mg/dL  CBG monitoring, ED  Result Value Ref Range   Glucose-Capillary 352 (H) 70 - 99 mg/dL  CBG monitoring, ED  Result Value Ref Range   Glucose-Capillary 274 (  H) 70 - 99 mg/dL   Laboratory interpretation all normal except except for hyperglycemia    EKG None  Radiology No results found.  Procedures Procedures (including critical care time)  Medications Ordered in ED Medications  insulin regular, human (MYXREDLIN) 100 units/ 100 mL infusion ( Intravenous Stopped 01/25/18 0602)  metFORMIN (GLUCOPHAGE) tablet 1,000 mg (has no administration in time range)  sodium chloride 0.9 % bolus 1,000 mL (0 mLs Intravenous Stopped 01/25/18 0352)  sodium chloride 0.9 % bolus 1,000 mL (0 mLs Intravenous Stopped 01/25/18 0352)  sodium chloride 0.9 % bolus 1,000 mL (0 mLs Intravenous Stopped 01/25/18 0515)      Initial Impression / Assessment and Plan / ED Course  I have reviewed the triage vital signs and the nursing notes.  Pertinent labs & imaging results that were available during my care of the patient were reviewed by me and considered in my medical decision making (see chart for details).     Patient was given IV fluids for his dehydration and started on insulin drip for his hyperglycemia.  Laboratory testing was done.  Patient CBG slowly improved.  I talked to the patient about my concern that his anion gap was borderline high and he had significant weight loss recently.  I am going to talk to the hospitalist to see if he should be admitted.  3:52 AM Dr. Darrick Meigs, hospitalist, suggests giving more fluids and repeating his be met around 5 AM.  If it is not improved then he would recommend admission.  Otherwise he feels like he could go home and restart his metformin.  Patient was only taking it once a day when he took it this past week.  Patient CBG slowly improved.  At almost 6 AM it was 274 and his insulin drip was stopped.  His repeat be met which was done around 6 AM was improved with a blood sugar of 277 and his anion gap had gone down to 9.  His renal insufficiency had improved.  He was discharged home.  He was not taking the metformin only once a day, he is advised to take it twice a day.  He was given 1000 mg in the ED to get him started and then he should take 500 twice a day.  He should follow-up with his doctor's office this week and let them know about his ED visit.  Final Clinical Impressions(s) / ED Diagnoses   Final diagnoses:  Hyperglycemia  Dehydration    ED Discharge Orders         Ordered    metFORMIN (GLUCOPHAGE) 500 MG tablet  2 times daily with meals     01/25/18 0653         Plan discharge  Rolland Porter, MD, Barbette Or, MD 01/25/18 [phone]

## 2021-03-02 ENCOUNTER — Emergency Department (HOSPITAL_COMMUNITY): Payer: Medicare Other

## 2021-03-02 ENCOUNTER — Other Ambulatory Visit: Payer: Self-pay

## 2021-03-02 ENCOUNTER — Emergency Department (HOSPITAL_COMMUNITY)
Admission: EM | Admit: 2021-03-02 | Discharge: 2021-03-03 | Disposition: A | Discharge status: Home / Self Care | Payer: Medicare Other | Source: Home / Self Care | Attending: Emergency Medicine | Admitting: Emergency Medicine

## 2021-03-02 DIAGNOSIS — Z85828 Personal history of other malignant neoplasm of skin: Secondary | ICD-10-CM | POA: Insufficient documentation

## 2021-03-02 DIAGNOSIS — Z87891 Personal history of nicotine dependence: Secondary | ICD-10-CM | POA: Insufficient documentation

## 2021-03-02 DIAGNOSIS — E785 Hyperlipidemia, unspecified: Secondary | ICD-10-CM | POA: Diagnosis not present

## 2021-03-02 DIAGNOSIS — H53462 Homonymous bilateral field defects, left side: Secondary | ICD-10-CM | POA: Diagnosis not present

## 2021-03-02 DIAGNOSIS — Z8546 Personal history of malignant neoplasm of prostate: Secondary | ICD-10-CM | POA: Insufficient documentation

## 2021-03-02 DIAGNOSIS — Z20822 Contact with and (suspected) exposure to covid-19: Secondary | ICD-10-CM | POA: Insufficient documentation

## 2021-03-02 DIAGNOSIS — G43909 Migraine, unspecified, not intractable, without status migrainosus: Secondary | ICD-10-CM | POA: Diagnosis not present

## 2021-03-02 DIAGNOSIS — R112 Nausea with vomiting, unspecified: Secondary | ICD-10-CM | POA: Insufficient documentation

## 2021-03-02 DIAGNOSIS — Z7984 Long term (current) use of oral hypoglycemic drugs: Secondary | ICD-10-CM | POA: Insufficient documentation

## 2021-03-02 DIAGNOSIS — F05 Delirium due to known physiological condition: Secondary | ICD-10-CM | POA: Diagnosis not present

## 2021-03-02 DIAGNOSIS — H5462 Unqualified visual loss, left eye, normal vision right eye: Secondary | ICD-10-CM | POA: Diagnosis not present

## 2021-03-02 DIAGNOSIS — H5340 Unspecified visual field defects: Secondary | ICD-10-CM | POA: Insufficient documentation

## 2021-03-02 DIAGNOSIS — R519 Headache, unspecified: Secondary | ICD-10-CM | POA: Diagnosis not present

## 2021-03-02 DIAGNOSIS — N1831 Chronic kidney disease, stage 3a: Secondary | ICD-10-CM | POA: Diagnosis not present

## 2021-03-02 DIAGNOSIS — E1165 Type 2 diabetes mellitus with hyperglycemia: Secondary | ICD-10-CM | POA: Insufficient documentation

## 2021-03-02 DIAGNOSIS — G40909 Epilepsy, unspecified, not intractable, without status epilepticus: Secondary | ICD-10-CM | POA: Diagnosis not present

## 2021-03-02 DIAGNOSIS — K769 Liver disease, unspecified: Secondary | ICD-10-CM | POA: Diagnosis not present

## 2021-03-02 DIAGNOSIS — Z823 Family history of stroke: Secondary | ICD-10-CM | POA: Diagnosis not present

## 2021-03-02 DIAGNOSIS — I129 Hypertensive chronic kidney disease with stage 1 through stage 4 chronic kidney disease, or unspecified chronic kidney disease: Secondary | ICD-10-CM | POA: Diagnosis not present

## 2021-03-02 DIAGNOSIS — Z9049 Acquired absence of other specified parts of digestive tract: Secondary | ICD-10-CM | POA: Diagnosis not present

## 2021-03-02 DIAGNOSIS — F064 Anxiety disorder due to known physiological condition: Secondary | ICD-10-CM | POA: Diagnosis not present

## 2021-03-02 DIAGNOSIS — K219 Gastro-esophageal reflux disease without esophagitis: Secondary | ICD-10-CM | POA: Diagnosis not present

## 2021-03-02 DIAGNOSIS — N135 Crossing vessel and stricture of ureter without hydronephrosis: Secondary | ICD-10-CM | POA: Diagnosis not present

## 2021-03-02 DIAGNOSIS — E871 Hypo-osmolality and hyponatremia: Secondary | ICD-10-CM | POA: Diagnosis not present

## 2021-03-02 DIAGNOSIS — E1169 Type 2 diabetes mellitus with other specified complication: Secondary | ICD-10-CM | POA: Diagnosis not present

## 2021-03-02 DIAGNOSIS — E1122 Type 2 diabetes mellitus with diabetic chronic kidney disease: Secondary | ICD-10-CM | POA: Diagnosis not present

## 2021-03-02 DIAGNOSIS — E872 Acidosis, unspecified: Secondary | ICD-10-CM | POA: Diagnosis not present

## 2021-03-02 DIAGNOSIS — G9341 Metabolic encephalopathy: Secondary | ICD-10-CM | POA: Diagnosis not present

## 2021-03-02 LAB — LIPASE, BLOOD: Lipase: 37 U/L (ref 11–51)

## 2021-03-02 LAB — COMPREHENSIVE METABOLIC PANEL
ALT: 21 U/L (ref 0–44)
AST: 12 U/L — ABNORMAL LOW (ref 15–41)
Albumin: 4.6 g/dL (ref 3.5–5.0)
Alkaline Phosphatase: 86 U/L (ref 38–126)
Anion gap: 13 (ref 5–15)
BUN: 37 mg/dL — ABNORMAL HIGH (ref 8–23)
CO2: 20 mmol/L — ABNORMAL LOW (ref 22–32)
Calcium: 9.7 mg/dL (ref 8.9–10.3)
Chloride: 99 mmol/L (ref 98–111)
Creatinine, Ser: 2.05 mg/dL — ABNORMAL HIGH (ref 0.61–1.24)
GFR, Estimated: 35 mL/min — ABNORMAL LOW (ref >60)
Glucose, Bld: 371 mg/dL — ABNORMAL HIGH (ref 70–99)
Potassium: 4.8 mmol/L (ref 3.5–5.1)
Sodium: 132 mmol/L — ABNORMAL LOW (ref 135–145)
Total Bilirubin: 0.9 mg/dL (ref 0.3–1.2)
Total Protein: 7.4 g/dL (ref 6.5–8.1)

## 2021-03-02 LAB — CBC WITH DIFFERENTIAL/PLATELET
Abs Immature Granulocytes: 0.02 10*3/uL (ref 0.00–0.07)
Basophils Absolute: 0 10*3/uL (ref 0.0–0.1)
Basophils Relative: 0 %
Eosinophils Absolute: 0 10*3/uL (ref 0.0–0.5)
Eosinophils Relative: 0 %
HCT: 43.6 % (ref 39.0–52.0)
Hemoglobin: 14.8 g/dL (ref 13.0–17.0)
Immature Granulocytes: 0 %
Lymphocytes Relative: 14 %
Lymphs Abs: 1.1 10*3/uL (ref 0.7–4.0)
MCH: 29.7 pg (ref 26.0–34.0)
MCHC: 33.9 g/dL (ref 30.0–36.0)
MCV: 87.4 fL (ref 80.0–100.0)
Monocytes Absolute: 0.4 10*3/uL (ref 0.1–1.0)
Monocytes Relative: 6 %
Neutro Abs: 6.1 10*3/uL (ref 1.7–7.7)
Neutrophils Relative %: 80 %
Platelets: 212 10*3/uL (ref 150–400)
RBC: 4.99 MIL/uL (ref 4.22–5.81)
RDW: 13 % (ref 11.5–15.5)
WBC: 7.6 10*3/uL (ref 4.0–10.5)
nRBC: 0 % (ref 0.0–0.2)

## 2021-03-02 LAB — RESP PANEL BY RT-PCR (FLU A&B, COVID) ARPGX2
Influenza A by PCR: NEGATIVE
Influenza B by PCR: NEGATIVE
SARS Coronavirus 2 by RT PCR: NEGATIVE

## 2021-03-02 LAB — CBG MONITORING, ED: Glucose-Capillary: 379 mg/dL — ABNORMAL HIGH (ref 70–99)

## 2021-03-02 IMAGING — MR MR HEAD W/O CM
4 series · 48 of 48 positions shown · non-contrast
Comparison: None.

CLINICAL DATA: Intermittent headaches with visual changes

EXAM:
MRI HEAD WITHOUT CONTRAST
TECHNIQUE: Multiplanar, multiecho pulse sequences of the brain and surrounding
structures were obtained without intravenous contrast.

[Series 5: DWI · axial · 3.0mm · 0.96mm/px · z∈[-89,+75]mm · 18 of 112 slices shown (1 of 4)]
[im 1/112]
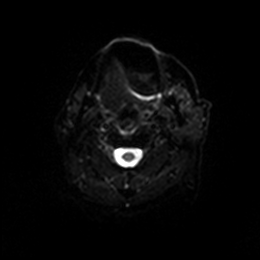
[im 7/112]
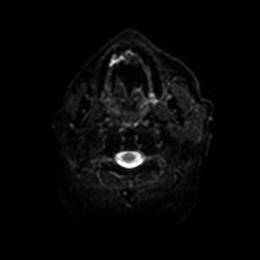
[im 14/112]
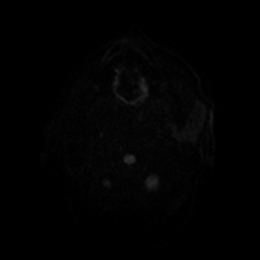
[im 20/112]
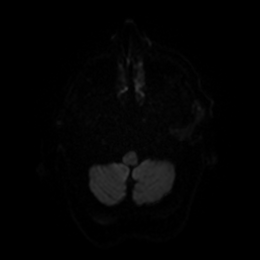
[im 27/112]
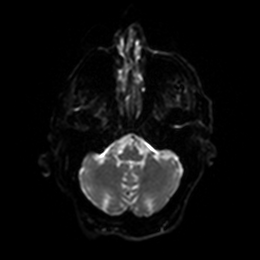
[im 33/112]
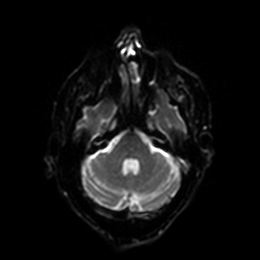
[im 40/112]
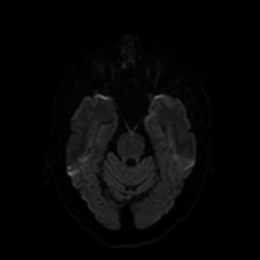
[im 46/112]
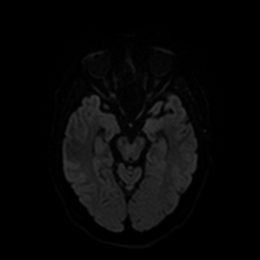
[im 53/112]
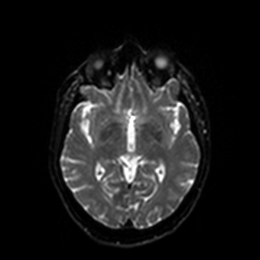
[im 59/112]
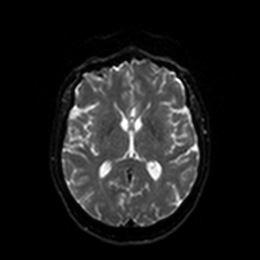
[im 66/112]
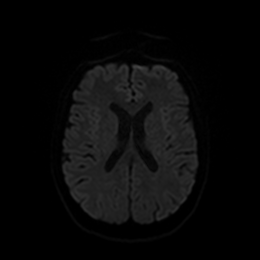
[im 72/112]
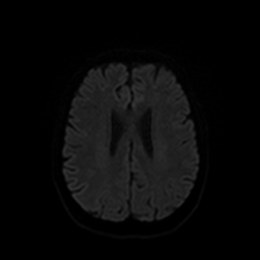
[im 79/112]
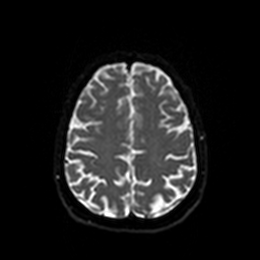
[im 85/112]
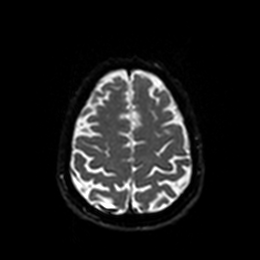
[im 92/112]
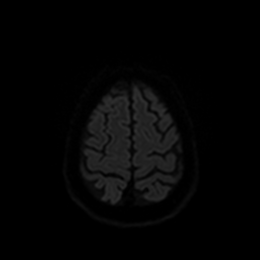
[im 98/112]
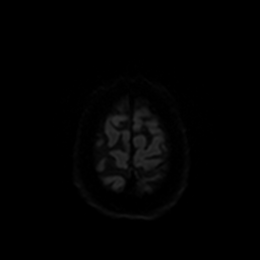
[im 105/112]
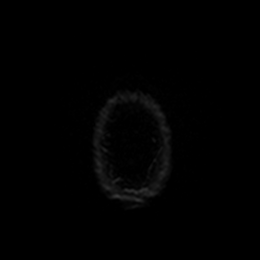
[im 112/112]
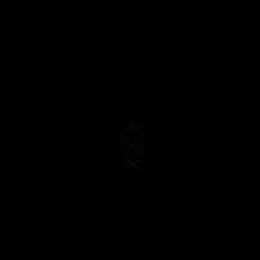

[Series 6: DWI · axial · 3.0mm · 0.96mm/px · z∈[-89,+75]mm · 9 of 56 slices shown (2 of 4)]
[im 1/56]
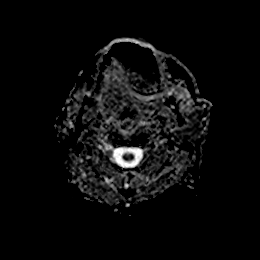
[im 7/56]
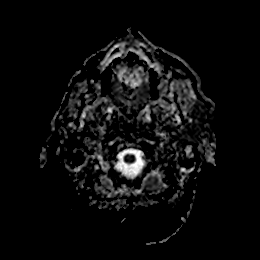
[im 14/56]
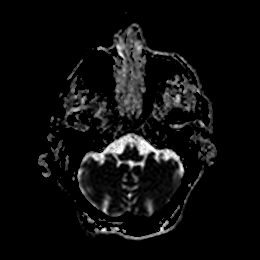
[im 21/56]
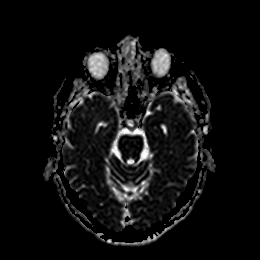
[im 28/56]
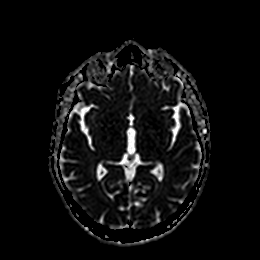
[im 35/56]
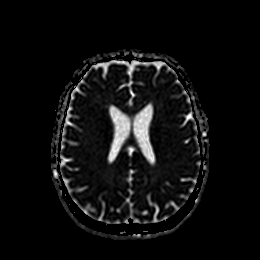
[im 42/56]
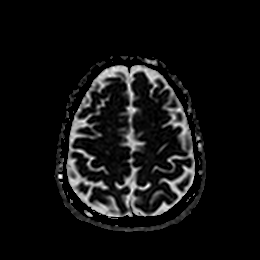
[im 49/56]
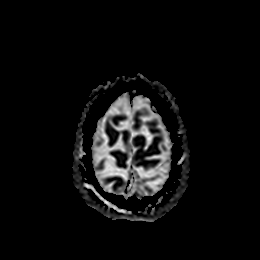
[im 56/56]
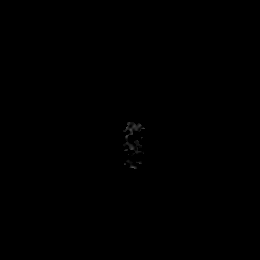

[Series 7: DWI · coronal · 4.0mm · 0.88mm/px · 14 of 82 slices shown (3 of 4)]
[im 1/82]
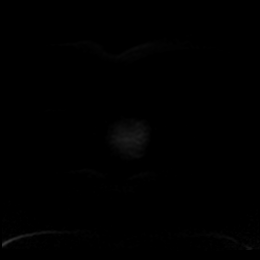
[im 7/82]
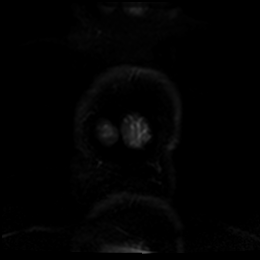
[im 13/82]
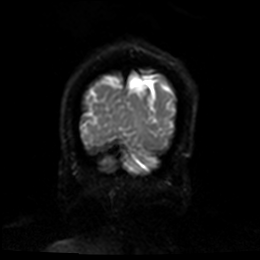
[im 19/82]
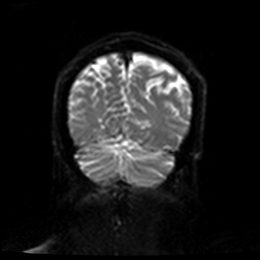
[im 25/82]
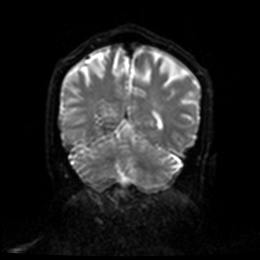
[im 32/82]
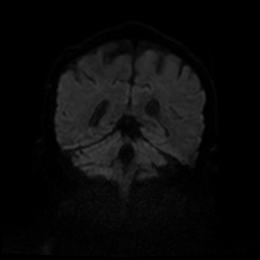
[im 38/82]
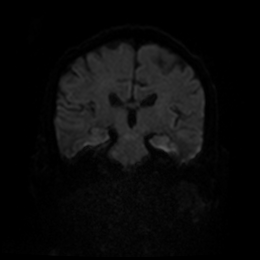
[im 44/82]
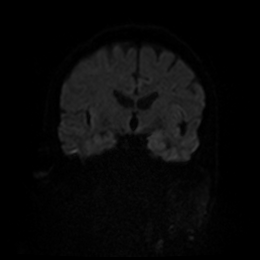
[im 50/82]
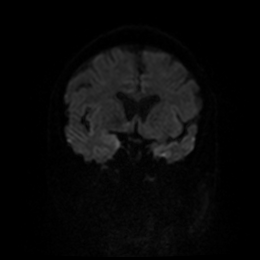
[im 57/82]
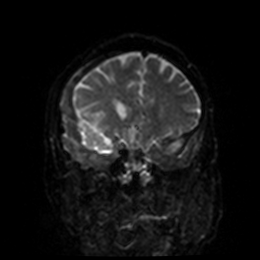
[im 63/82]
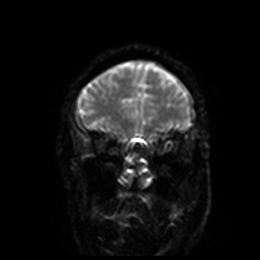
[im 69/82]
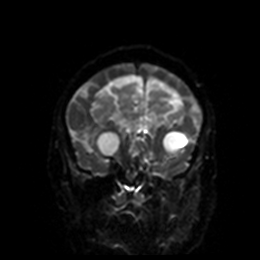
[im 75/82]
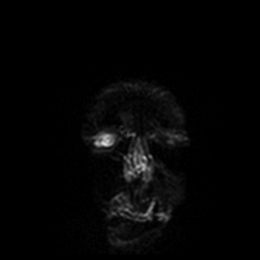
[im 82/82]
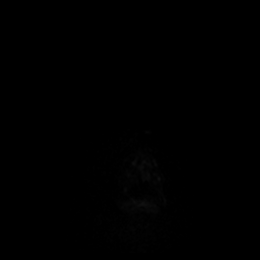

[Series 8: DWI · coronal · 4.0mm · 0.88mm/px · 7 of 41 slices shown (4 of 4)]
[im 1/41]
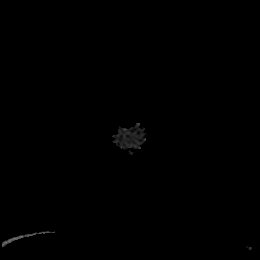
[im 7/41]
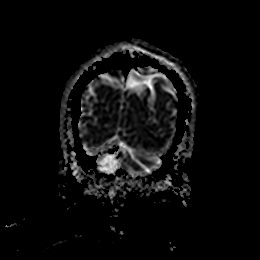
[im 14/41]
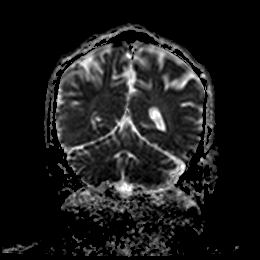
[im 21/41]
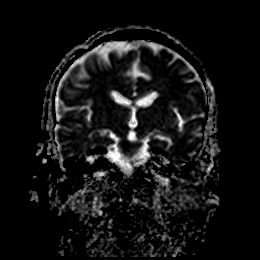
[im 27/41]
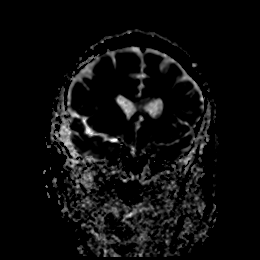
[im 34/41]
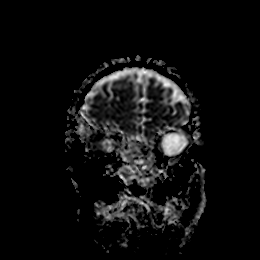
[im 41/41]
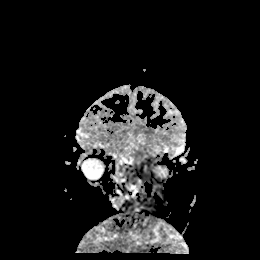

[48 of 48 positions shown; findings below may reference images not displayed]

FINDINGS: Evaluation is limited as patient could not tolerate more than the
axial and coronal diffusion-weighted sequences.

The sequences demonstrate no restricted diffusion to suggest acute
or subacute infarct.
IMPRESSION: Limited evaluation as patient could not tolerate the exam. Within
this limitation, no acute or subacute infarct.

## 2021-03-02 IMAGING — CT CT HEAD W/O CM
3 series · 16 of 47 positions shown, 19 images · non-contrast
Comparison: None.

CLINICAL DATA: Headache

EXAM:
CT HEAD WITHOUT CONTRAST
TECHNIQUE: Contiguous axial images were obtained from the base of the skull
through the vertex without intravenous contrast.

[Series 2: head w o · axial · 0.49mm/px · z∈[+37,+162]mm · 10 of 31 slices shown, 13 images]
[im 3/31  brain]
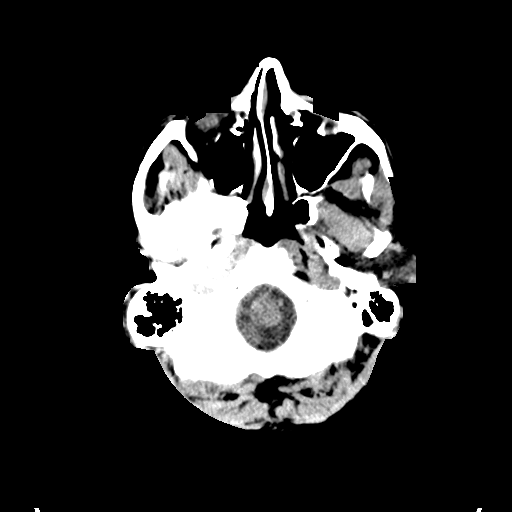
[im 3/31  bone]
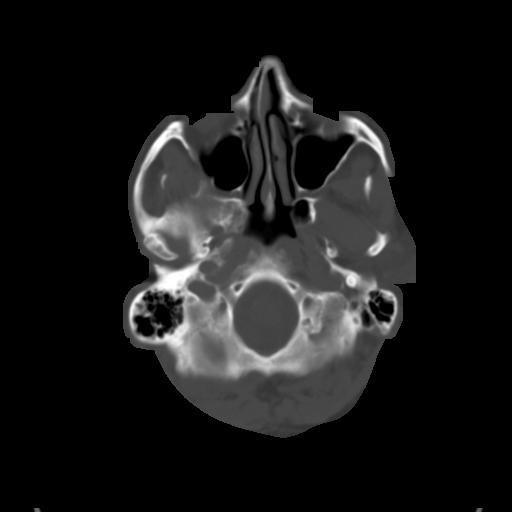
[im 6/31  brain]
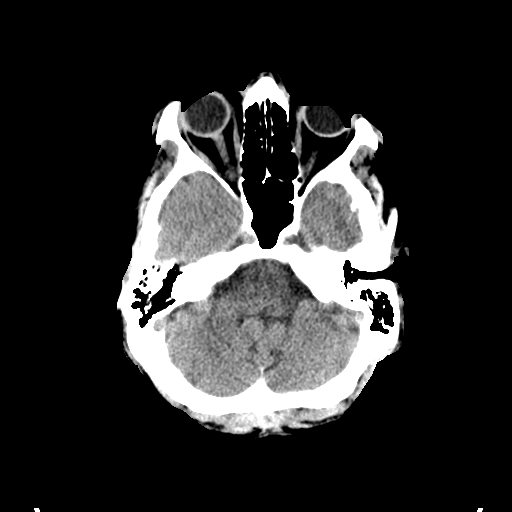
[im 9/31  brain]
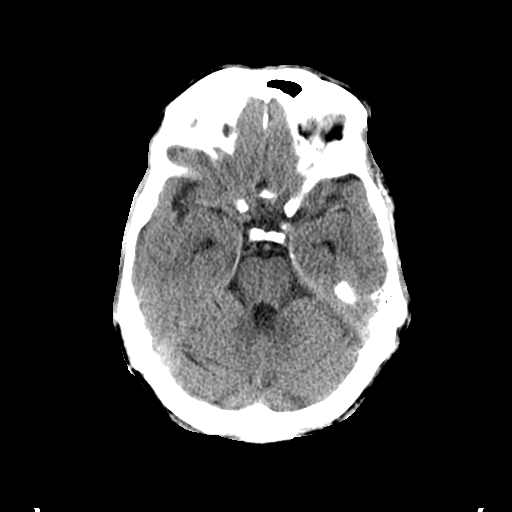
[im 11/31  brain]
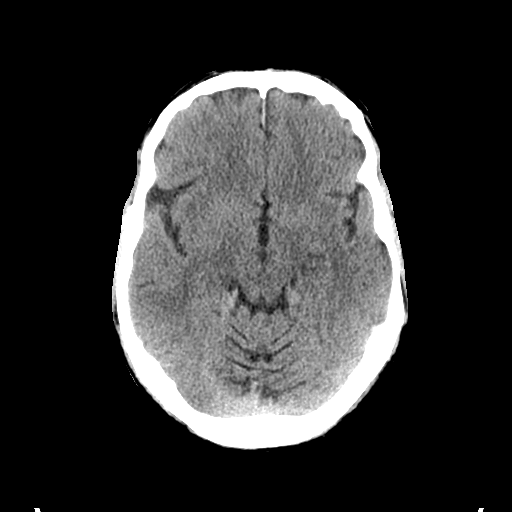
[im 14/31  brain]
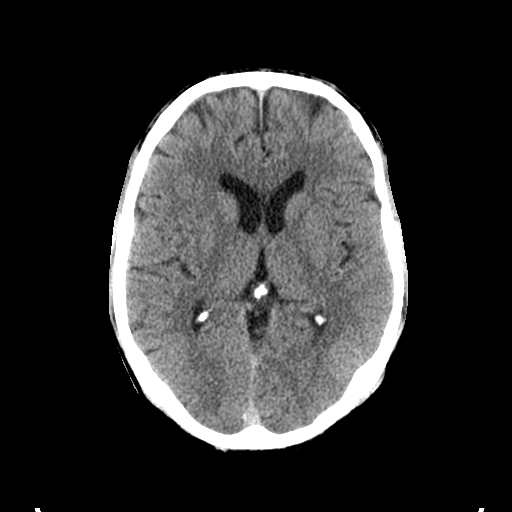
[im 14/31  bone]
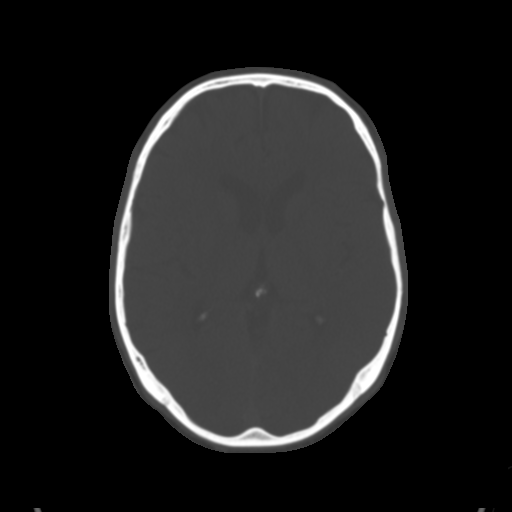
[im 17/31  brain]
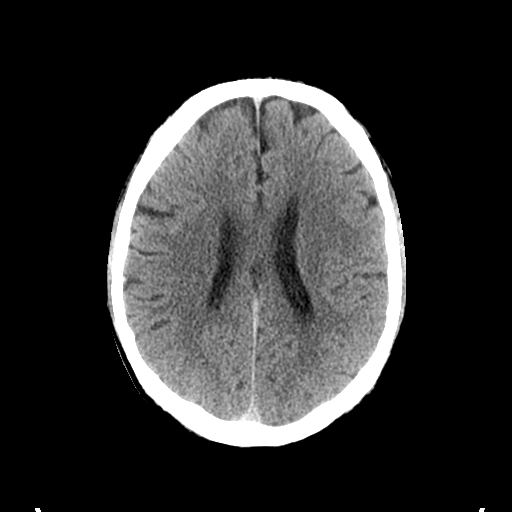
[im 20/31  brain]
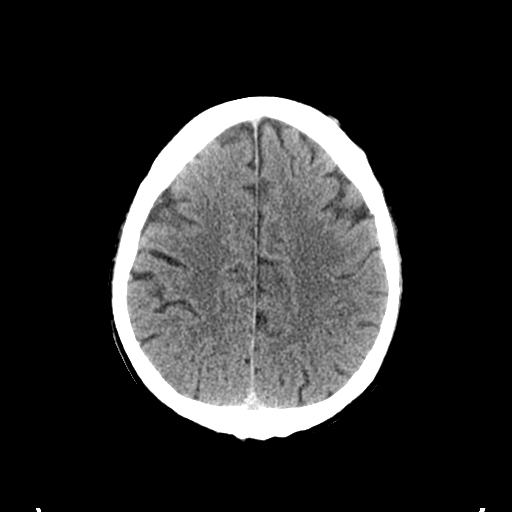
[im 23/31  brain]
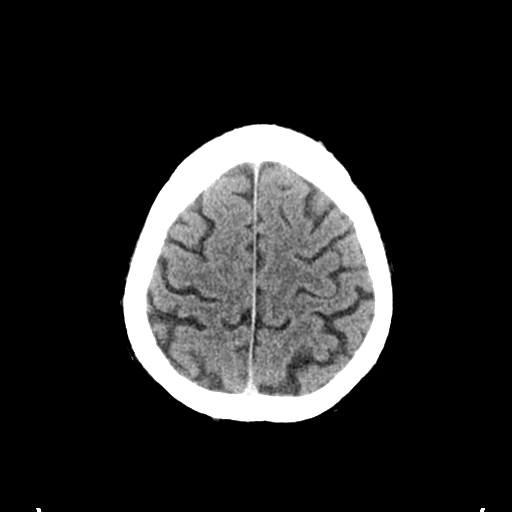
[im 25/31  brain]
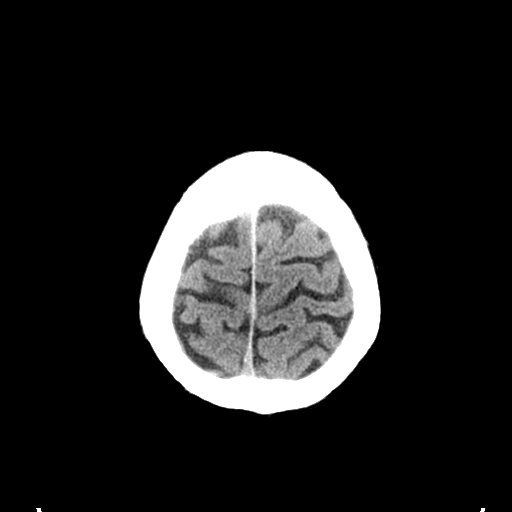
[im 25/31  bone]
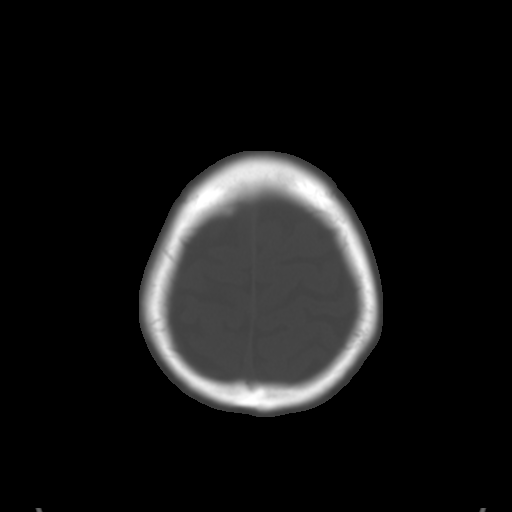
[im 28/31  brain]
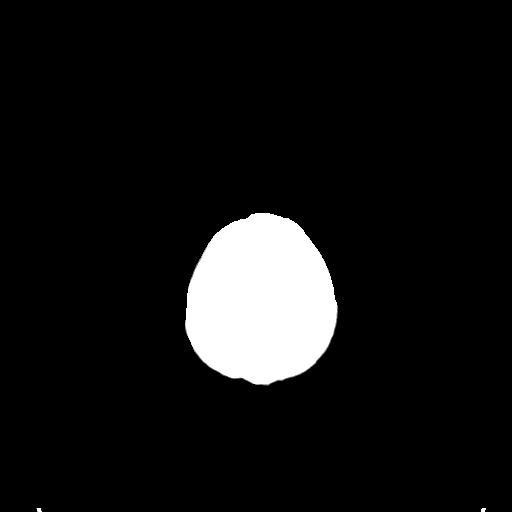

[Series 4: coronal soft · coronal · 0.32mm/px · 3 of 70 slices shown]
[im 24/70  brain]
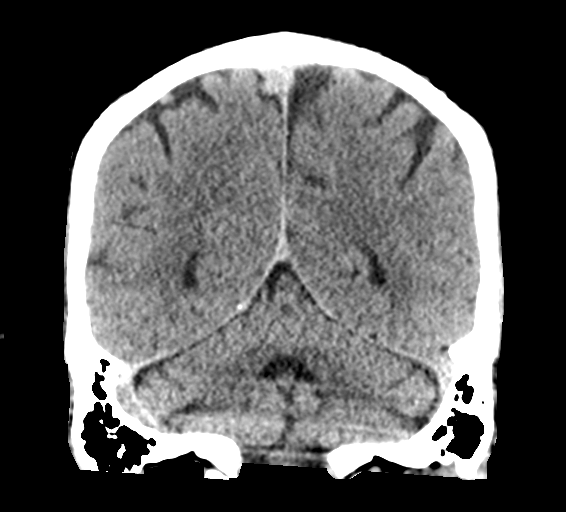
[im 31/70  brain]
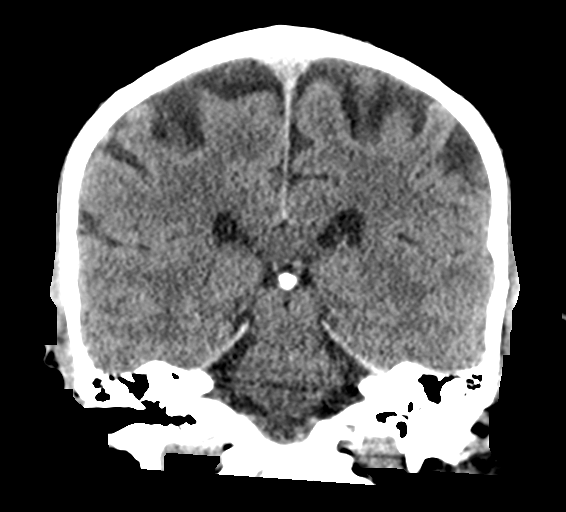
[im 39/70  brain]
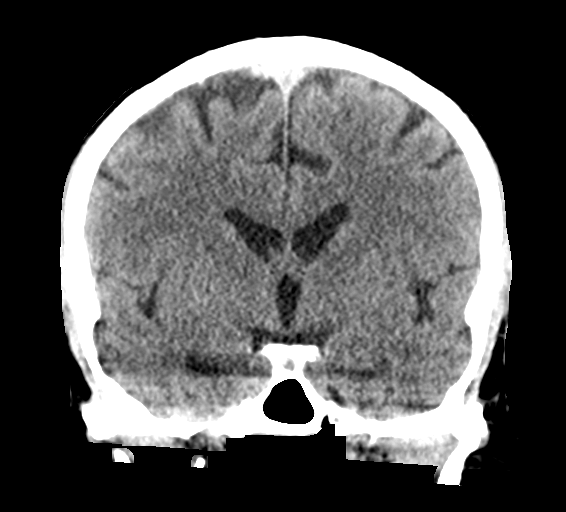

[Series 5: sagittal soft · sagittal · 0.32mm/px · 3 of 60 slices shown]
[im 20/60  brain]
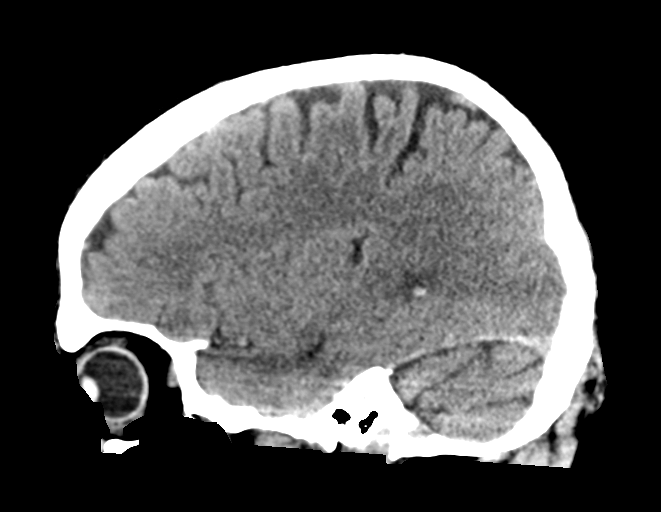
[im 30/60  brain]
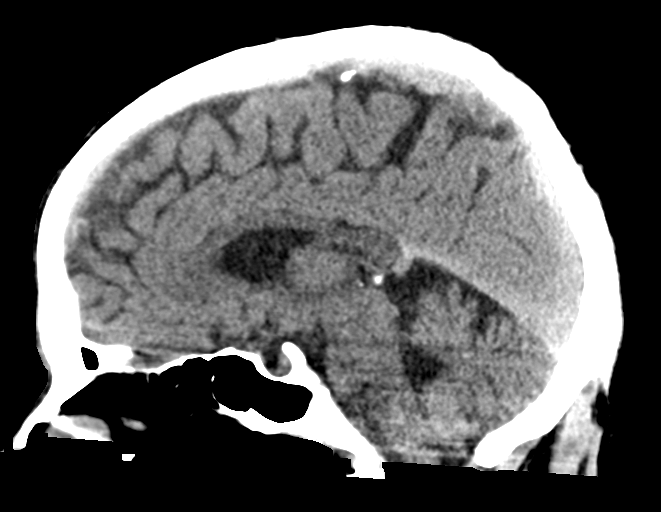
[im 40/60  brain]
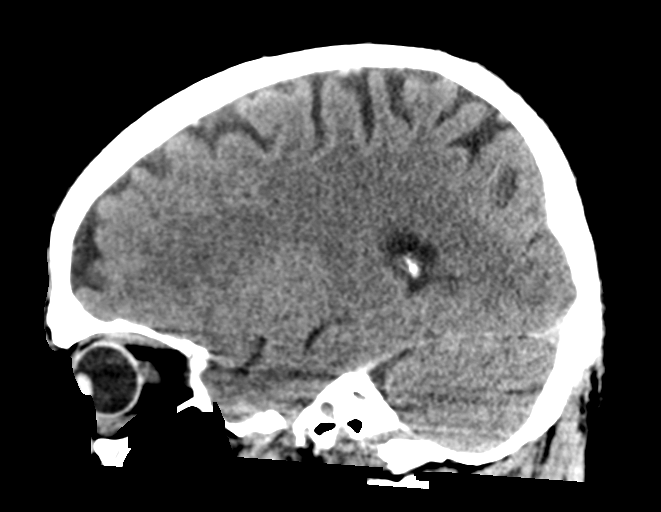

[16 of 47 positions shown; findings below may reference images not displayed]

FINDINGS: Brain: No evidence of acute infarction, hemorrhage, hydrocephalus,
extra-axial collection or mass lesion/mass effect.

Vascular: No hyperdense vessel or unexpected calcification.

Skull: Normal. Negative for fracture or focal lesion.

Sinuses/Orbits: No acute finding.

Other: None.
IMPRESSION: No acute intracranial findings.

## 2021-03-02 MED ORDER — SODIUM CHLORIDE 0.9 % IV BOLUS
1000.0000 mL | Freq: Once | INTRAVENOUS | Status: AC
  Administered 2021-03-02: 1000 mL via INTRAVENOUS

## 2021-03-02 MED ORDER — SODIUM CHLORIDE 0.9 % IV BOLUS
500.0000 mL | Freq: Once | INTRAVENOUS | Status: AC
  Administered 2021-03-02: 500 mL via INTRAVENOUS

## 2021-03-02 MED ORDER — PROCHLORPERAZINE EDISYLATE 10 MG/2ML IJ SOLN
10.0000 mg | Freq: Once | INTRAMUSCULAR | Status: AC
  Administered 2021-03-02: 10 mg via INTRAVENOUS
  Filled 2021-03-02: qty 2

## 2021-03-02 MED ORDER — TETRACAINE HCL 0.5 % OP SOLN
1.0000 [drp] | Freq: Once | OPHTHALMIC | Status: AC
  Administered 2021-03-02: 1 [drp] via OPHTHALMIC
  Filled 2021-03-02: qty 4

## 2021-03-02 MED ORDER — ONDANSETRON HCL 4 MG/2ML IJ SOLN
4.0000 mg | Freq: Once | INTRAMUSCULAR | Status: AC
  Administered 2021-03-02: 4 mg via INTRAVENOUS
  Filled 2021-03-02: qty 2

## 2021-03-02 MED ORDER — DIPHENHYDRAMINE HCL 50 MG/ML IJ SOLN
12.5000 mg | Freq: Once | INTRAMUSCULAR | Status: AC
  Administered 2021-03-02: 12.5 mg via INTRAVENOUS
  Filled 2021-03-02: qty 1

## 2021-03-02 MED ORDER — MIDAZOLAM HCL 2 MG/2ML IJ SOLN
2.0000 mg | Freq: Once | INTRAMUSCULAR | Status: AC
  Administered 2021-03-02: 2 mg via INTRAVENOUS
  Filled 2021-03-02: qty 2

## 2021-03-02 MED ORDER — MORPHINE SULFATE (PF) 4 MG/ML IV SOLN
4.0000 mg | Freq: Once | INTRAVENOUS | Status: AC
  Administered 2021-03-02: 4 mg via INTRAVENOUS
  Filled 2021-03-02: qty 1

## 2021-03-02 NOTE — ED Provider Notes (Signed)
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  Transferred from [facility] for further evaluation of atypical migraines w/ new left sided hemianopia. Neuro consulted prior to transfer. Unable to obtain CTA due to acute on chronic kidney dysfxn. Recommended transfer here for MRIs.  If no neuro findings, recommend ophthalmology consult. Received iv fluids, migraine cocktail prior to transfer. Afebrile, vitally stable on arrival. Resting comfortably. No focal deficits on neuro exam. Will require versed for claustrophobia. Awaiting transport to MRI. Care of this pt transitioned to oncoming provider awaiting mri results, neuro f/u.    Idamae Lusher, MD 03/02/21 [place], MD 03/03/21 2027

## 2021-03-02 NOTE — Plan of Care (Signed)
HPI as provided by EDP: New left hemianopia reported by patient and confirmed on exam by PA Idol, negative head CT despite at least 2 days of symptoms. Background of chronic migraine headaches worse in the last one week (8 days ago). Aura (flashing lights) has been worse in addition to the reported vision loss. Unknown if vision loss is intermittent or constant. Headaches typically resolve with aspirin but this has been more persistent. Background of very poorly controlled diabetes, worsening renal function here from 3 years ago (unclear if worsening CKD vs AKI)  Per PA Idol, no clear pattern of exacerbating/alleviating factors, no positionality to headache,  On exam: gait difficulty (dizzy only on standing), left hemianopia and no neurological deficits  Labs: Elevated glucose in 300s (baseline for patient), Cr 2.02, GFR just above 30  Current vital signs: BP 120/77   Pulse 71   Temp 97.8 F (36.6 C) (Oral)   Resp 16   Ht 5\' 8"  (1.727 m)   Wt 86.2 kg   SpO2 100%   BMI 28.89 kg/m  Vital signs in last 24 hours: Temp:  [97.8 F (36.6 C)] 97.8 F (36.6 C) (11/24 1452) Pulse Rate:  [71-90] 71 (11/24 1800) Resp:  [14-20] 16 (11/24 1800) BP: (120-142)/(73-87) 120/77 (11/24 1800) SpO2:  [96 %-100 %] 100 % (11/24 1800) Weight:  [86.2 kg] 86.2 kg (11/24 1453)  Head CT personally reviewed, agree with radiology there is no clear acute intracranial process; however there is significant chronic microvascular change that could be hiding a more acute process.   Impression: Localization for left hemianopia would be most likely right hemisphere optic tract, less likely other intracranial areas with negative head CT after 2 days of symptoms. Without clear process on head CT I would be surprised given the extent of the deficit described if this was a stroke. Given age and marked worsening of prior headaches as well as malignancy history, would obtain imaging to rule out acute intracranial processes  contributing (aneurysm in particular, venous thrombosis, malignancy given prior history). If all imaging is negative may need further evaluation by ophthalmology (alternative localization would be both retinas independently losing right sided function which is much less likely anatomically).  CTA/CTV would be a faster study but given his renal function, MRI will be preferred  Recommend: -Orthostatic vitals  -IVF as needed per ED   -MRI brain w/ and w/o  -MRA head w/o -MRA neck w/ and w/o  -MRV w/ contast  -Symptomatic treatment of headache per ED, avoid NSAIDS given renal function -Appreciate management of glucose and other comorbidities per ED -May need full neurology eval pending imaging  Past Medical History:  Diagnosis Date   GERD (gastroesophageal reflux disease)    Hiatal hernia    History of esophageal dilatation    05/ 2004  incompleted ring   Mohs defect of cheek 2011   rt side/ eye to mandible   Prostate cancer [facility]) urologist-  dr dahlstedt/  oncologist-  dr Tammi Klippel   Stage T1c, Gleason 7, PSA 5.9, vol 27cc   Skin cancer of face 2011   returned 2013-radiation tx      Past Surgical History:  Procedure Laterality Date   COLONOSCOPY     CYSTOSCOPY N/A 06/29/2016   Procedure: CYSTOSCOPY FLEXIBLE;  Surgeon: Franchot Gallo, MD;  [facility];  Service: Urology;  Laterality: N/A;  no seeds found in bladder   ESOPHAGOGASTRODUODENOSCOPY (EGD) WITH ESOPHAGEAL DILATION  08/2002   HEMORRHOID SURGERY  INCISIONAL HERNIA REPAIR N/A 12/07/2015   Procedure: LAPAROSCOPIC INCISIONAL HERNIA WITH MESH ;  Surgeon: Fanny Skates, MD;  Location: [place];  Service: General;  Laterality: N/A;   LAPAROSCOPIC CHOLECYSTECTOMY  2010   PROSTATE BIOPSY     RADIOACTIVE SEED IMPLANT N/A 06/29/2016   Procedure: RADIOACTIVE SEED IMPLANT/BRACHYTHERAPY IMPLANT;  Surgeon: Franchot Gallo, MD;  Location: [facility];  Service: Urology;  Laterality: N/A;  86  seeds implanted    SKIN GRAFT  2013,2011   from neck to face on right side/ skin cancer and tumor   Current Outpatient Medications  Medication Instructions   gabapentin (NEURONTIN) 300 mg, Oral   ibuprofen (ADVIL) 200 mg, Oral, Every 6 hours PRN   metFORMIN (GLUCOPHAGE) 500 mg, Oral, 2 times daily with meals   nitroGLYCERIN (NITROSTAT) 0.3 mg, Sublingual, Every 5 min PRN   oxybutynin (DITROPAN) 5 mg, Oral, Every 8 hours PRN   pantoprazole (PROTONIX) 40 mg, Oral, Daily   traMADol (ULTRAM) 50 mg, Oral, Every 6 hours PRN   Lesleigh Noe MD-PhD [facility] [phone] [facility] coverage for [facility] is from 8 AM to 4 AM in-house and 4 PM to 8 PM by telephone/video. 8 PM to 8 AM emergent questions or overnight urgent questions should be addressed to Teleneurology On-call or [facility] neurohospitalist; contact information can be found on AMION

## 2021-03-02 NOTE — ED Triage Notes (Signed)
Migraine x 8 days, vomited once last week and once yesterday. Today feeling dizzy, blurred vision, blood sugar was 387 this morning

## 2021-03-02 NOTE — ED Notes (Signed)
ED Provider at bedside. 

## 2021-03-02 NOTE — ED Triage Notes (Signed)
Pt bib [facility] from [facility] c/o headache for 8 days with blurred vision in his Lt eye. Lt side does not react to visual threat, and will have a Lt side gaze and return to center if asked. Ct was negative at [facility] and was transferred here for an MRI. Last CBG prior to transfer was 397. Pt says he normally lives in the 300s at home with his blood sugar. VSS upon arrival. A&Ox4.

## 2021-03-02 NOTE — ED Notes (Signed)
ED to ED transport has been called

## 2021-03-02 NOTE — ED Notes (Signed)
Patient transported to MRI 

## 2021-03-02 NOTE — ED Provider Notes (Signed)
[facility] EMERGENCY DEPARTMENT Provider Note   CSN: [id number] Arrival date & time: 03/02/21  1445     History No chief complaint on file.   [name] is a 65 y.o. 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   He reports intermittent episodes of sharp stabbing pain in his right eye only.  He has had several episodes of nausea with emesis, he denies fevers or chills, he denies focal weakness in his extremities.  He does endorse feeling dizzy and wife at bedside states he has needed help with ambulation at times as he has been unsteady on his feet.  Denies fevers or chills, denies prior eye problems, he does wear glasses for reading only.  The history is provided by the patient.      Past Medical History:  Diagnosis Date   GERD (gastroesophageal reflux disease)    Hiatal hernia    History of esophageal dilatation    05/ 2004  incompleted ring   Mohs defect of cheek 2011   rt side/ eye to mandible   Prostate cancer [facility]) urologist-  dr dahlstedt/  oncologist-  dr Tammi Klippel   Stage T1c, Gleason 7, PSA 5.9, vol 27cc   Skin cancer of face 2011   returned 2013-radiation tx     Patient Active Problem List   Diagnosis Date Noted   Malignant neoplasm of prostate ([place]) 04/30/2016   Incisional hernia, without obstruction or gangrene 12/07/2015   Incisional hernia with obstruction  12/07/2015    Past Surgical History:  Procedure Laterality Date   COLONOSCOPY     CYSTOSCOPY N/A 06/29/2016   Procedure: CYSTOSCOPY FLEXIBLE;  Surgeon: Franchot Gallo, MD;  Location: [facility];  Service: Urology;  Laterality: N/A;  no seeds found in bladder   ESOPHAGOGASTRODUODENOSCOPY (EGD) WITH ESOPHAGEAL DILATION  08/2002   HEMORRHOID SURGERY     INCISIONAL HERNIA REPAIR N/A 12/07/2015   Procedure: LAPAROSCOPIC INCISIONAL HERNIA WITH MESH ;  Surgeon: Fanny Skates, MD;  Location: [place];  Service: General;  Laterality: N/A;   LAPAROSCOPIC CHOLECYSTECTOMY  2010   PROSTATE BIOPSY     RADIOACTIVE SEED IMPLANT N/A 06/29/2016   Procedure: RADIOACTIVE SEED IMPLANT/BRACHYTHERAPY IMPLANT;  Surgeon: Franchot Gallo, MD;  Location: [facility];  Service: Urology;  Laterality: N/A;  63 seeds implanted    SKIN GRAFT  2013,2011   from neck to face on right side/ skin cancer and tumor       Family History  Problem Relation Age of Onset   Cancer Mother        colon    Social History   Tobacco Use   Smoking status: Former    Packs/day: 0.50    Years: 10.00    Pack years: 5.00    Types: Cigarettes    Quit date: 04/09/1978    Years since quitting: 42.9   Smokeless tobacco: Never   Tobacco comments:  QUIT SMOKING IN THE 1980'S  Substance Use Topics   Alcohol use: No   Drug use: No    Home Medications Prior to Admission medications   Medication Sig Start Date End Date Taking? Authorizing Provider  gabapentin (NEURONTIN) 100 MG capsule Take 300 mg by mouth.    [provider]  ibuprofen (ADVIL,MOTRIN) 200 MG tablet Take 200 mg by mouth every 6 (six) hours as needed for moderate pain.    [provider]  metFORMIN (GLUCOPHAGE) 500 MG tablet Take 1 tablet (500 mg total) by mouth 2 (two) times daily with a meal. 01/25/18   Rolland Porter, MD  nitroGLYCERIN (NITROSTAT) 0.3 MG SL tablet Place 0.3 mg under the tongue every 5 (five) minutes  as needed for chest pain.    [provider]  oxybutynin (DITROPAN) 5 MG tablet Take 1 tablet (5 mg total) by mouth every 8 (eight) hours as needed for bladder spasms. 07/20/16   Bruning, Ashlyn, PA-C  pantoprazole (PROTONIX) 40 MG tablet Take 40 mg by mouth daily. 11/17/15   [provider]  traMADol (ULTRAM) 50 MG tablet Take 1 tablet (50 mg total) by mouth every 6 (six) hours as needed. 06/29/16   Franchot Gallo, MD    Allergies    Azithromycin, Ciprofloxacin, and Codeine  Review of Systems   Review of Systems  Constitutional:  Negative for chills and fever.  HENT:  Negative for congestion.   Eyes:  Positive for pain and visual disturbance.  Respiratory:  Negative for chest tightness and shortness of breath.   Cardiovascular:  Negative for chest pain.  Gastrointestinal:  Positive for nausea and vomiting. Negative for abdominal pain.  Genitourinary: Negative.   Musculoskeletal:  Negative for arthralgias, joint swelling and neck pain.  Skin: Negative.  Negative for rash and wound.  Neurological:  Positive for dizziness and headaches. Negative for weakness, light-headedness and numbness.  Psychiatric/Behavioral: Negative.    All other systems reviewed and are negative.  Physical Exam Updated Vital Signs BP 123/81 (BP Location: Right Arm)   Pulse 67   Temp 97.8 F (36.6 C) (Oral)   Resp 13   Ht 5\' 8"  (1.727 m)   Wt 86.2 kg   SpO2 100%   BMI 28.89 kg/m   Physical Exam Vitals and nursing note reviewed. Exam conducted with a chaperone present.  Constitutional:      Appearance: He is well-developed.     Comments: Uncomfortable appearing  HENT:     Head: Normocephalic and atraumatic.     Right Ear: Tympanic membrane normal.     Left Ear: Tympanic membrane normal.  Eyes:     General: Lids are normal. Gaze aligned appropriately. Visual field deficit present.     Intraocular pressure: Right eye pressure is 8 mmHg. Left eye pressure is 10 mmHg.      Extraocular Movements: Extraocular movements intact.     Right eye: No nystagmus.     Left eye: No nystagmus.     Conjunctiva/sclera:     Right eye: Right conjunctiva is injected.     Pupils: Pupils are equal, round, and reactive to light. Pupils are equal.     Funduscopic exam:    Right eye: No hemorrhage.        Left eye: No hemorrhage.     Visual Fields:     Right eye: ABS in the upper nasal quadrant. ABS in the lower nasal quadrant.     Left eye: ABS in the upper temporal quadrant. ABS  in the lower temporal quadrant.     Comments: Mild injection right eye.   Cardiovascular:     Rate and Rhythm: Normal rate and regular rhythm.     Heart sounds: Normal heart sounds.  Pulmonary:     Effort: Pulmonary effort is normal.     Breath sounds: Normal breath sounds. No wheezing.  Abdominal:     General: Bowel sounds are normal.     Palpations: Abdomen is soft.     Tenderness: There is no abdominal tenderness.  Musculoskeletal:        General: Normal range of motion.     Cervical back: Normal range of motion and neck supple.  Lymphadenopathy:     Cervical: No cervical adenopathy.  Skin:    General: Skin is warm and dry.     Findings: No rash.  Neurological:     General: No focal deficit present.     Mental Status: He is alert and oriented to person, place, and time.     GCS: GCS eye subscore is 4. GCS verbal subscore is 5. GCS motor subscore is 6.     Cranial Nerves: No cranial nerve deficit.     Sensory: No sensory deficit.     Coordination: Coordination normal.     Gait: Gait normal.     Deep Tendon Reflexes: Reflexes normal.     Comments: Normal heel-shin, normal rapid alternating movements. Cranial nerves III-XII intact.  No pronator drift.  Psychiatric:        Speech: Speech normal.        Behavior: Behavior normal.        Thought Content: Thought content normal.    ED Results / Procedures / Treatments   Labs (all labs ordered are listed, but only abnormal results are  displayed) Labs Reviewed  COMPREHENSIVE METABOLIC PANEL - Abnormal; Notable for the following components:      Result Value   Sodium 132 (*)    CO2 20 (*)    Glucose, Bld 371 (*)    BUN 37 (*)    Creatinine, Ser 2.05 (*)    AST 12 (*)    GFR, Estimated 35 (*)    All other components within normal limits  CBG MONITORING, ED - Abnormal; Notable for the following components:   Glucose-Capillary 379 (*)    All other components within normal limits  CBC WITH DIFFERENTIAL/PLATELET  LIPASE, BLOOD    EKG EKG Interpretation  Date/Time:  Thursday March 02 2021 15:00:33 EST Ventricular Rate:  80 PR Interval:  191 QRS Duration: 103 QT Interval:  381 QTC Calculation: 440 R Axis:   95 Text Interpretation: Sinus rhythm Right axis deviation ST elevation, consider inferior injury Baseline wander in lead(s) V2 No significant change since prior 2/18 Confirmed by Aletta Edouard [phone]) on 03/02/2021 3:13:10 PM  Radiology CT Head Wo Contrast  Result Date: 03/02/2021 CLINICAL DATA:  Headache EXAM: CT HEAD WITHOUT CONTRAST TECHNIQUE: Contiguous axial images were obtained from the base of the skull through the vertex without intravenous contrast. COMPARISON:  None. FINDINGS: Brain: No evidence of acute infarction, hemorrhage, hydrocephalus, extra-axial collection or mass lesion/mass effect. Vascular: No hyperdense vessel or unexpected calcification. Skull: Normal. Negative for fracture or focal lesion. Sinuses/Orbits: No acute finding. Other: None. IMPRESSION: No acute intracranial findings. Electronically Signed   By: Davina Poke D.O.   On: 03/02/2021 16:40    Procedures Procedures   Medications Ordered in ED Medications  prochlorperazine (COMPAZINE) injection 10 mg (has no administration  in time range)  diphenhydrAMINE (BENADRYL) injection 12.5 mg (has no administration in time range)  tetracaine (PONTOCAINE) 0.5 % ophthalmic solution 1 drop (1 drop Both Eyes Given by Other 03/02/21  1619)  sodium chloride 0.9 % bolus 500 mL (0 mLs Intravenous Stopped 03/02/21 1839)  ondansetron (ZOFRAN) injection 4 mg (4 mg Intravenous Given 03/02/21 1616)  morphine 4 MG/ML injection 4 mg (4 mg Intravenous Given 03/02/21 1813)  sodium chloride 0.9 % bolus 1,000 mL (1,000 mLs Intravenous New Bag/Given 03/02/21 1936)    ED Course  I have reviewed the triage vital signs and the nursing notes.  Pertinent labs & imaging results that were available during my care of the patient were reviewed by me and considered in my medical decision making (see chart for details).  Clinical Course as of 03/02/21 1942  Thu Mar 02, 3172  3239 65 year old male here with headache blurry vision and decreased vision for the last 3 days.  Increase blood glucose.  Said initially felt like he was having some dots in his vision and now there is a shadow specifically on his left peripheral vision.  Getting labs head CT and neurology consult. [MB]    Clinical Course User Index [MB] Hayden Rasmussen, MD   MDM Rules/Calculators/A&P                           Labs and imaging reviewed, pt with hyperglycemia, chronic, renal insufficiency, also appears somewhat chronic in comparison to prior labs, but elevated today at 2.05, possibly from dehydration associated with hyperglycemia.  He is receiving IV fluids here.  He was given an IV dose of morphine which minimally improved his headache symptoms.  We will try Compazine and Benadryl, which may improve symptoms if his symptoms are a grossly atypical migraine presentation.  We are holding NSAIDs at this time given his renal insufficiency.  Discussed pt's sx with Dr. Curly Shores of neurology who recommends ed to ed transfer to [facility] for MRI imaging tonight, if he has no acute findings/neurovascular reason for his sx, would recommend ophthalmology consult.  Discussed patient with Dr. Maryan Rued at [facility] who is aware of patient being transferred.   Discussed plan with patient and his  wife who are agreeable.  We also discussed the nature of MRI imaging.  He states he does have a history of claustrophobia, but states he had an MRI in the past at [facility] imaging and did well for this study. Final Clinical Impression(s) / ED Diagnoses Final diagnoses:  Intractable headache, unspecified chronicity pattern, unspecified headache type  Visual field defect    Rx / DC Orders ED Discharge Orders     None        Landis Martins 03/02/21 1943    Hayden Rasmussen, MD 03/03/21 1055

## 2021-03-03 ENCOUNTER — Emergency Department (HOSPITAL_COMMUNITY): Payer: Medicare Other

## 2021-03-03 IMAGING — CT CT VENOGRAM HEAD
1 of 7 series · 2 of 36 positions shown · IV contrast (Omni 300)
Comparison: [DATE] CT head, [DATE] MRI head.

CLINICAL DATA: Headache, stroke suspected

EXAM:
CT ANGIOGRAPHY HEAD AND NECK
CT VENOGRAM HEAD
TECHNIQUE: Multidetector CT imaging of the head and neck was performed using
the standard protocol during bolus administration of intravenous
contrast. Multiplanar CT image reconstructions and MIPs were
obtained to evaluate the vascular anatomy. Carotid stenosis
measurements (when applicable) are obtained utilizing NASCET
criteria, using the distal internal carotid diameter as the
denominator.
Venographic phase images of the brain were obtained following the
administration of intravenous contrast. Multiplanar reformats and
maximum intensity projections were generated.
CONTRAST:  75mL OMNIPAQUE IOHEXOL 350 MG/ML SOLN

[Series 7: head with · axial · 0.39mm/px · z∈[-91,-39]mm · 2 of 78 slices shown]
[im 26/78  soft-tissue]
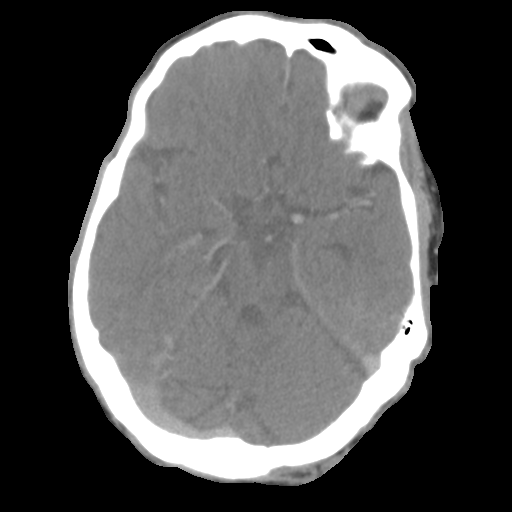
[im 52/78  bone]
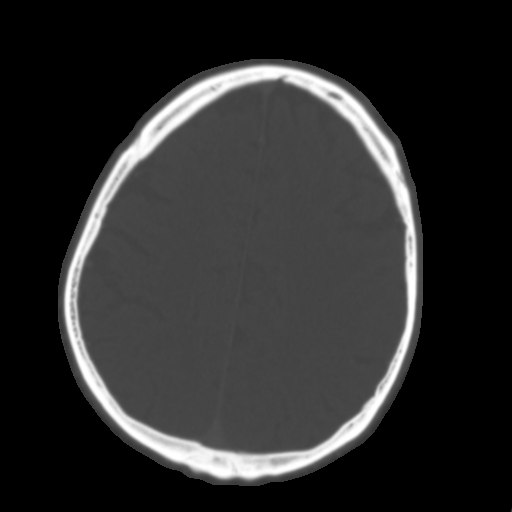

[2 of 36 positions shown; findings below may reference images not displayed]

FINDINGS: CT HEAD FINDINGS

For noncontrast findings, please see same day CT head.

CTA NECK FINDINGS

Aortic arch: Standard branching. Imaged portion shows no evidence of
aneurysm or dissection. No significant stenosis of the major arch
vessel origins.

Right carotid system: No evidence of dissection, stenosis (50% or
greater) or occlusion.

Left carotid system: No evidence of dissection, stenosis (50% or
greater) or occlusion.

Vertebral arteries: No evidence of dissection, stenosis (50% or
greater) or occlusion.

Skeleton: No acute osseous abnormality.

Other neck: Atrophy of the right parotid and submandibular glands.
Otherwise negative.

Upper chest: No focal pulmonary opacity or pleural effusion.

Review of the MIP images confirms the above findings

CTA HEAD FINDINGS

Anterior circulation: Both internal carotid arteries are patent to
the termini, without stenosis or other abnormality. A1 segments
patent. Normal anterior communicating artery. Azygous ACA. Anterior
cerebral arteries are patent to their distal aspects. No M1 stenosis
or occlusion. Normal MCA bifurcations. Distal MCA branches perfused
and symmetric.

Posterior circulation: Vertebral arteries patent to the
vertebrobasilar junction without stenosis. Basilar patent to its
distal aspect. Superior cerebellar arteries patent bilaterally.
Fetal origin of the right PCA. PCAs perfused to their distal aspects
without stenosis. Visualized right posterior communicating artery.
The left posterior communicating artery is not visualized.

Anatomic variants: None significant.

Review of the MIP images confirms the above findings.

CTV FINDINGS

No venous sinus stenosis or thrombosis.
IMPRESSION: 1. No intracranial large vessel occlusion or significant stenosis.
2. No hemodynamically significant stenosis in the neck.
3. No venous sinus stenosis or thrombosis.

## 2021-03-03 IMAGING — CT CT ANGIO HEAD-NECK (W OR W/O PERF)
3 of 7 series · 10 of 36 positions shown · IV contrast (OMNI 350)
Comparison: [DATE] CT head, [DATE] MRI head.

CLINICAL DATA: Headache, stroke suspected

EXAM:
CT ANGIOGRAPHY HEAD AND NECK
CT VENOGRAM HEAD
TECHNIQUE: Multidetector CT imaging of the head and neck was performed using
the standard protocol during bolus administration of intravenous
contrast. Multiplanar CT image reconstructions and MIPs were
obtained to evaluate the vascular anatomy. Carotid stenosis
measurements (when applicable) are obtained utilizing NASCET
criteria, using the distal internal carotid diameter as the
denominator.
Venographic phase images of the brain were obtained following the
administration of intravenous contrast. Multiplanar reformats and
maximum intensity projections were generated.
CONTRAST:  75mL OMNIPAQUE IOHEXOL 350 MG/ML SOLN

[Series 5: cta neck · axial · 0.45mm/px · z∈[-242,-124]mm · 2 of 179 slices shown]
[im 60/179  soft-tissue]
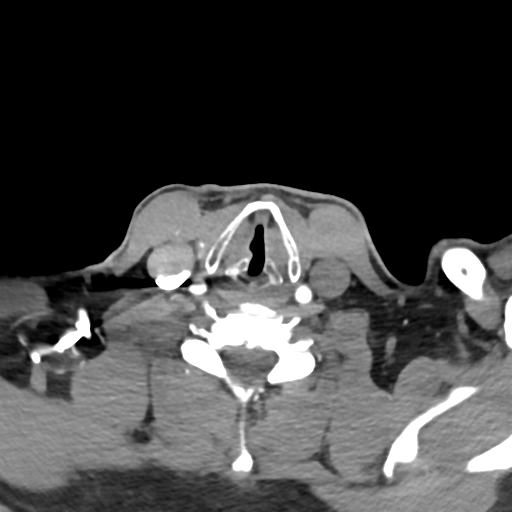
[im 119/179  soft-tissue]
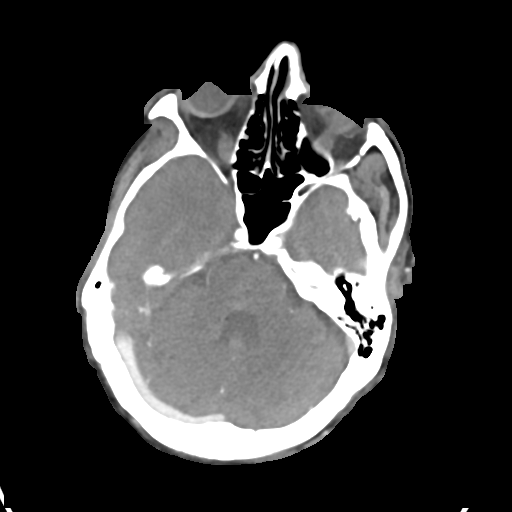

[Series 11: cta neck axial · axial · 0.39mm/px · z∈[-360,-105]mm · 6 of 374 slices shown]
[im 54/374  soft-tissue]
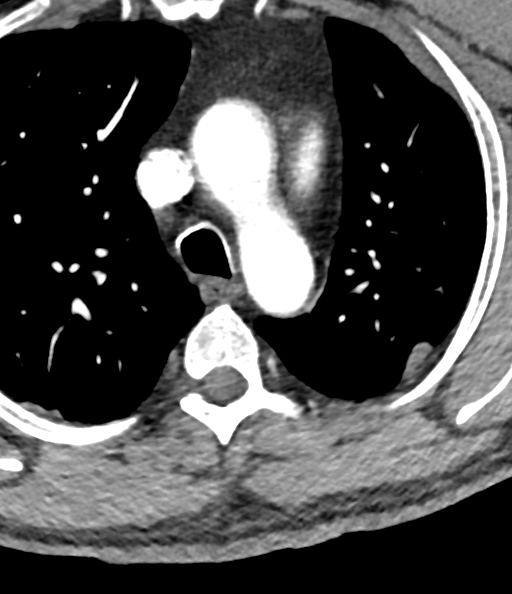
[im 107/374  bone]
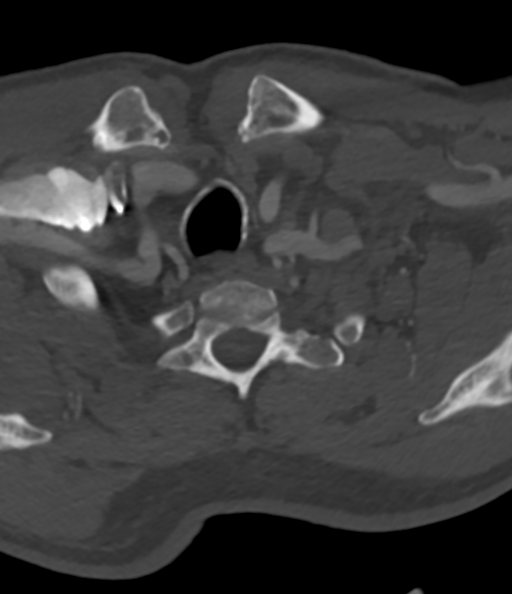
[im 160/374  soft-tissue]
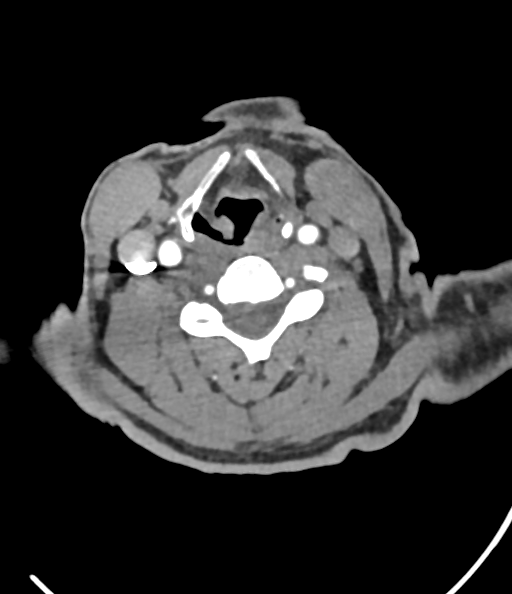
[im 214/374  bone]
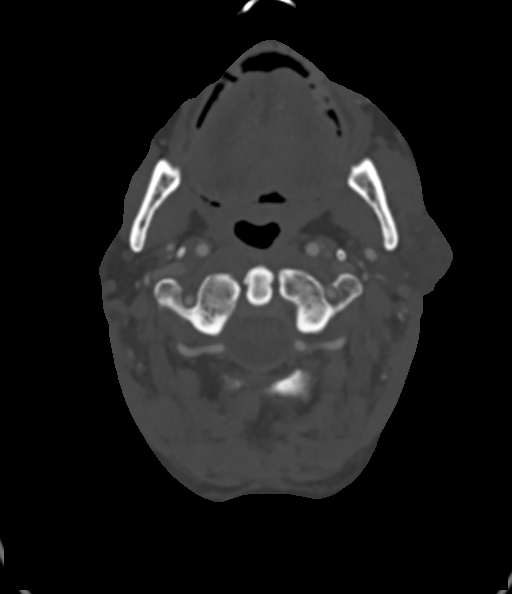
[im 267/374  soft-tissue]
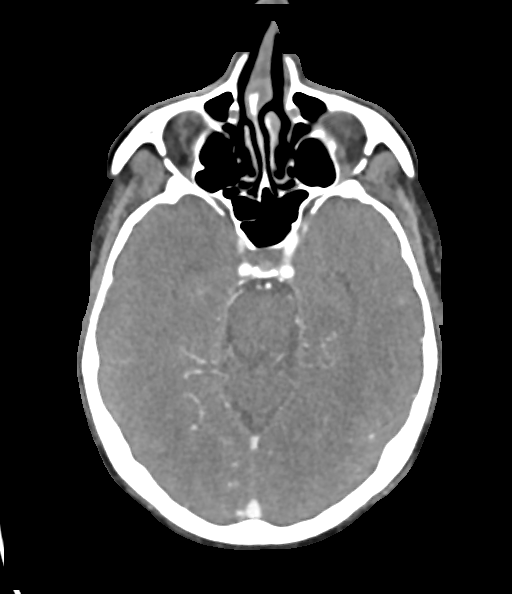
[im 320/374  bone]
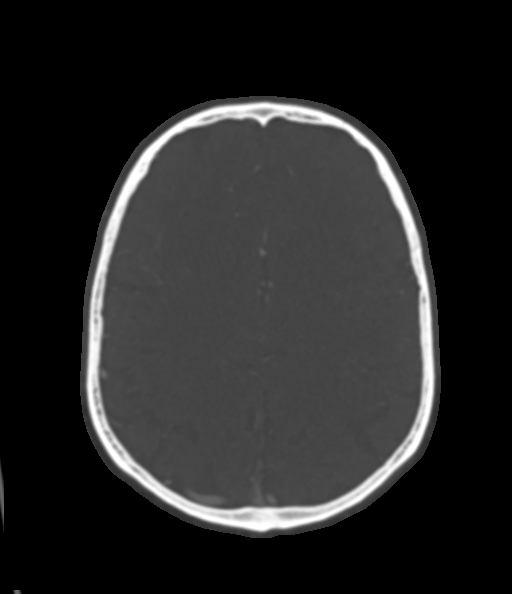

[Series 15: cta neck sagittal · sagittal · 0.45mm/px · 2 of 201 slices shown]
[im 55/201  soft-tissue]
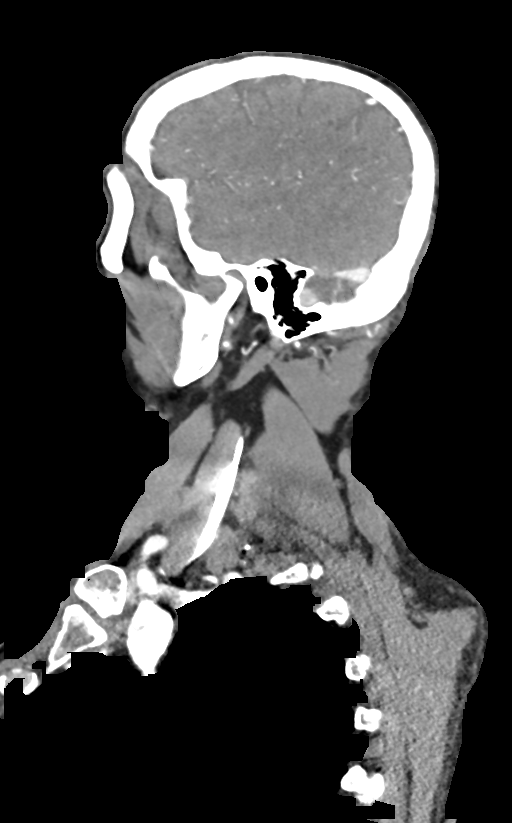
[im 147/201  soft-tissue]
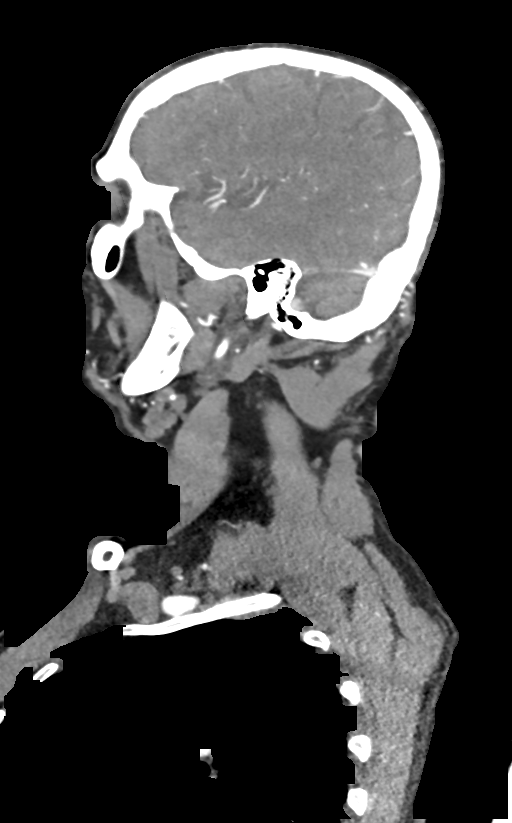

[10 of 36 positions shown; findings below may reference images not displayed]

FINDINGS: CT HEAD FINDINGS

For noncontrast findings, please see same day CT head.

CTA NECK FINDINGS

Aortic arch: Standard branching. Imaged portion shows no evidence of
aneurysm or dissection. No significant stenosis of the major arch
vessel origins.

Right carotid system: No evidence of dissection, stenosis (50% or
greater) or occlusion.

Left carotid system: No evidence of dissection, stenosis (50% or
greater) or occlusion.

Vertebral arteries: No evidence of dissection, stenosis (50% or
greater) or occlusion.

Skeleton: No acute osseous abnormality.

Other neck: Atrophy of the right parotid and submandibular glands.
Otherwise negative.

Upper chest: No focal pulmonary opacity or pleural effusion.

Review of the MIP images confirms the above findings

CTA HEAD FINDINGS

Anterior circulation: Both internal carotid arteries are patent to
the termini, without stenosis or other abnormality. A1 segments
patent. Normal anterior communicating artery. Azygous ACA. Anterior
cerebral arteries are patent to their distal aspects. No M1 stenosis
or occlusion. Normal MCA bifurcations. Distal MCA branches perfused
and symmetric.

Posterior circulation: Vertebral arteries patent to the
vertebrobasilar junction without stenosis. Basilar patent to its
distal aspect. Superior cerebellar arteries patent bilaterally.
Fetal origin of the right PCA. PCAs perfused to their distal aspects
without stenosis. Visualized right posterior communicating artery.
The left posterior communicating artery is not visualized.

Anatomic variants: None significant.

Review of the MIP images confirms the above findings.

CTV FINDINGS

No venous sinus stenosis or thrombosis.
IMPRESSION: 1. No intracranial large vessel occlusion or significant stenosis.
2. No hemodynamically significant stenosis in the neck.
3. No venous sinus stenosis or thrombosis.

## 2021-03-03 MED ORDER — IOHEXOL 350 MG/ML SOLN
75.0000 mL | Freq: Once | INTRAVENOUS | Status: AC | PRN
  Administered 2021-03-03: 75 mL via INTRAVENOUS

## 2021-03-03 NOTE — ED Notes (Signed)
Patient verbalizes understanding of discharge instructions. Opportunity for questioning and answers were provided. Armband removed by staff, pt discharged from ED and ambulated to lobby to return home.   

## 2021-03-03 NOTE — ED Provider Notes (Signed)
1:10 AM Assumed care from Dr. Maryan Rued and Leward Quan, please see their note for full history, physical and decision making until this point. In brief this is a 65 y.o. year old male who presented to the ED tonight with Headache and Hyperglycemia     Please see previous notes for details but patient has a new left hemianopsia associate with a headache.  History of cancer.  Transferred here for MRI.  Patient not able to fully complete the whole MRI series that was requested by neurology.  I evaluate the patient states his headache is slightly improved and is visual field deficit slightly improved as well.  I talked to our neurologist here and felt that a CT angio and a CTV would suffice.  Patient GFR 35 creatinine 2.05.  He has low to no risk of contrast-induced nephropathy.  Consent signed.  We will pursue CT scan.  CT shows no obvious vascular abnormalities.  Per previous discussion with neurology this warrants him for an outpatient work-up.  Patient discharged for same.  Discharge instructions, including strict return precautions for new or worsening symptoms, given. Patient and/or family verbalized understanding and agreement with the plan as described.   Labs, studies and imaging reviewed by myself and considered in medical decision making if ordered. Imaging interpreted by radiology.  Labs Reviewed  COMPREHENSIVE METABOLIC PANEL - Abnormal; Notable for the following components:      Result Value   Sodium 132 (*)    CO2 20 (*)    Glucose, Bld 371 (*)    BUN 37 (*)    Creatinine, Ser 2.05 (*)    AST 12 (*)    GFR, Estimated 35 (*)    All other components within normal limits  CBG MONITORING, ED - Abnormal; Notable for the following components:   Glucose-Capillary 379 (*)    All other components within normal limits  RESP PANEL BY RT-PCR (FLU A&B, COVID) ARPGX2  CBC WITH DIFFERENTIAL/PLATELET  LIPASE, BLOOD    MR BRAIN WO CONTRAST  Final Result    CT Head Wo Contrast  Final Result     MR ANGIO HEAD WO CONTRAST    (Results Pending)  MR ANGIO NECK WO CONTRAST    (Results Pending)  MR MRV HEAD WO CM    (Results Pending)  CT ANGIO HEAD W OR WO CONTRAST    (Results Pending)  CT VENOGRAM HEAD    (Results Pending)    No follow-ups on file.    Esmae Donathan, Corene Cornea, MD 03/04/21 [phone]

## 2021-03-04 ENCOUNTER — Other Ambulatory Visit: Payer: Self-pay

## 2021-03-04 ENCOUNTER — Emergency Department (HOSPITAL_COMMUNITY): Payer: Medicare Other

## 2021-03-04 ENCOUNTER — Inpatient Hospital Stay (HOSPITAL_COMMUNITY)
Admission: EM | Admit: 2021-03-04 | Discharge: 2021-03-13 | DRG: 101 | Disposition: A | Discharge status: Home Health | Payer: Medicare Other | Attending: Internal Medicine | Admitting: Internal Medicine

## 2021-03-04 ENCOUNTER — Encounter (HOSPITAL_COMMUNITY): Payer: Self-pay | Admitting: Emergency Medicine

## 2021-03-04 DIAGNOSIS — E871 Hypo-osmolality and hyponatremia: Secondary | ICD-10-CM | POA: Diagnosis present

## 2021-03-04 DIAGNOSIS — N1831 Chronic kidney disease, stage 3a: Secondary | ICD-10-CM

## 2021-03-04 DIAGNOSIS — F064 Anxiety disorder due to known physiological condition: Secondary | ICD-10-CM | POA: Diagnosis present

## 2021-03-04 DIAGNOSIS — E119 Type 2 diabetes mellitus without complications: Secondary | ICD-10-CM

## 2021-03-04 DIAGNOSIS — N183 Chronic kidney disease, stage 3 unspecified: Secondary | ICD-10-CM | POA: Diagnosis not present

## 2021-03-04 DIAGNOSIS — Z7984 Long term (current) use of oral hypoglycemic drugs: Secondary | ICD-10-CM

## 2021-03-04 DIAGNOSIS — Z20822 Contact with and (suspected) exposure to covid-19: Secondary | ICD-10-CM | POA: Diagnosis present

## 2021-03-04 DIAGNOSIS — I129 Hypertensive chronic kidney disease with stage 1 through stage 4 chronic kidney disease, or unspecified chronic kidney disease: Secondary | ICD-10-CM | POA: Diagnosis present

## 2021-03-04 DIAGNOSIS — K769 Liver disease, unspecified: Secondary | ICD-10-CM | POA: Diagnosis present

## 2021-03-04 DIAGNOSIS — Z888 Allergy status to other drugs, medicaments and biological substances status: Secondary | ICD-10-CM

## 2021-03-04 DIAGNOSIS — F05 Delirium due to known physiological condition: Secondary | ICD-10-CM | POA: Diagnosis present

## 2021-03-04 DIAGNOSIS — E1165 Type 2 diabetes mellitus with hyperglycemia: Secondary | ICD-10-CM | POA: Diagnosis present

## 2021-03-04 DIAGNOSIS — C801 Malignant (primary) neoplasm, unspecified: Secondary | ICD-10-CM

## 2021-03-04 DIAGNOSIS — E1169 Type 2 diabetes mellitus with other specified complication: Secondary | ICD-10-CM | POA: Diagnosis not present

## 2021-03-04 DIAGNOSIS — K219 Gastro-esophageal reflux disease without esophagitis: Secondary | ICD-10-CM | POA: Diagnosis present

## 2021-03-04 DIAGNOSIS — R569 Unspecified convulsions: Secondary | ICD-10-CM

## 2021-03-04 DIAGNOSIS — E872 Acidosis, unspecified: Secondary | ICD-10-CM | POA: Diagnosis present

## 2021-03-04 DIAGNOSIS — E785 Hyperlipidemia, unspecified: Secondary | ICD-10-CM

## 2021-03-04 DIAGNOSIS — Z9049 Acquired absence of other specified parts of digestive tract: Secondary | ICD-10-CM

## 2021-03-04 DIAGNOSIS — H5462 Unqualified visual loss, left eye, normal vision right eye: Secondary | ICD-10-CM | POA: Diagnosis present

## 2021-03-04 DIAGNOSIS — Z79899 Other long term (current) drug therapy: Secondary | ICD-10-CM

## 2021-03-04 DIAGNOSIS — I639 Cerebral infarction, unspecified: Secondary | ICD-10-CM | POA: Diagnosis present

## 2021-03-04 DIAGNOSIS — R109 Unspecified abdominal pain: Secondary | ICD-10-CM

## 2021-03-04 DIAGNOSIS — Z8546 Personal history of malignant neoplasm of prostate: Secondary | ICD-10-CM

## 2021-03-04 DIAGNOSIS — G9341 Metabolic encephalopathy: Secondary | ICD-10-CM | POA: Diagnosis present

## 2021-03-04 DIAGNOSIS — G40919 Epilepsy, unspecified, intractable, without status epilepticus: Secondary | ICD-10-CM

## 2021-03-04 DIAGNOSIS — Z85828 Personal history of other malignant neoplasm of skin: Secondary | ICD-10-CM

## 2021-03-04 DIAGNOSIS — Z9114 Patient's other noncompliance with medication regimen: Secondary | ICD-10-CM

## 2021-03-04 DIAGNOSIS — E1122 Type 2 diabetes mellitus with diabetic chronic kidney disease: Secondary | ICD-10-CM | POA: Diagnosis present

## 2021-03-04 DIAGNOSIS — G43909 Migraine, unspecified, not intractable, without status migrainosus: Secondary | ICD-10-CM | POA: Diagnosis present

## 2021-03-04 DIAGNOSIS — G40909 Epilepsy, unspecified, not intractable, without status epilepticus: Principal | ICD-10-CM | POA: Diagnosis present

## 2021-03-04 DIAGNOSIS — Z823 Family history of stroke: Secondary | ICD-10-CM

## 2021-03-04 DIAGNOSIS — Z87891 Personal history of nicotine dependence: Secondary | ICD-10-CM

## 2021-03-04 DIAGNOSIS — Z881 Allergy status to other antibiotic agents status: Secondary | ICD-10-CM

## 2021-03-04 DIAGNOSIS — N135 Crossing vessel and stricture of ureter without hydronephrosis: Secondary | ICD-10-CM | POA: Diagnosis present

## 2021-03-04 DIAGNOSIS — H53462 Homonymous bilateral field defects, left side: Secondary | ICD-10-CM | POA: Diagnosis present

## 2021-03-04 DIAGNOSIS — Z885 Allergy status to narcotic agent status: Secondary | ICD-10-CM

## 2021-03-04 HISTORY — DX: Type 2 diabetes mellitus with other circulatory complications: E11.59

## 2021-03-04 HISTORY — DX: Type 2 diabetes mellitus without complications: E11.9

## 2021-03-04 HISTORY — DX: Chronic kidney disease, stage 3 unspecified: N18.30

## 2021-03-04 HISTORY — DX: Type 2 diabetes mellitus with other specified complication: E11.69

## 2021-03-04 HISTORY — DX: Hypertension secondary to endocrine disorders: I15.2

## 2021-03-04 LAB — CBC WITH DIFFERENTIAL/PLATELET
Abs Immature Granulocytes: 0.04 10*3/uL (ref 0.00–0.07)
Basophils Absolute: 0 10*3/uL (ref 0.0–0.1)
Basophils Relative: 0 %
Eosinophils Absolute: 0 10*3/uL (ref 0.0–0.5)
Eosinophils Relative: 0 %
HCT: 49.1 % (ref 39.0–52.0)
Hemoglobin: 16.3 g/dL (ref 13.0–17.0)
Immature Granulocytes: 1 %
Lymphocytes Relative: 15 %
Lymphs Abs: 1.3 10*3/uL (ref 0.7–4.0)
MCH: 29.6 pg (ref 26.0–34.0)
MCHC: 33.2 g/dL (ref 30.0–36.0)
MCV: 89.3 fL (ref 80.0–100.0)
Monocytes Absolute: 0.7 10*3/uL (ref 0.1–1.0)
Monocytes Relative: 8 %
Neutro Abs: 6.7 10*3/uL (ref 1.7–7.7)
Neutrophils Relative %: 76 %
Platelets: 238 10*3/uL (ref 150–400)
RBC: 5.5 MIL/uL (ref 4.22–5.81)
RDW: 12.8 % (ref 11.5–15.5)
WBC: 8.7 10*3/uL (ref 4.0–10.5)
nRBC: 0 % (ref 0.0–0.2)

## 2021-03-04 LAB — GLUCOSE, CAPILLARY: Glucose-Capillary: 164 mg/dL — ABNORMAL HIGH (ref 70–99)

## 2021-03-04 LAB — BASIC METABOLIC PANEL
Anion gap: 18 — ABNORMAL HIGH (ref 5–15)
BUN: 35 mg/dL — ABNORMAL HIGH (ref 8–23)
CO2: 13 mmol/L — ABNORMAL LOW (ref 22–32)
Calcium: 9.9 mg/dL (ref 8.9–10.3)
Chloride: 105 mmol/L (ref 98–111)
Creatinine, Ser: 1.81 mg/dL — ABNORMAL HIGH (ref 0.61–1.24)
GFR, Estimated: 41 mL/min — ABNORMAL LOW (ref >60)
Glucose, Bld: 200 mg/dL — ABNORMAL HIGH (ref 70–99)
Potassium: 4.3 mmol/L (ref 3.5–5.1)
Sodium: 136 mmol/L (ref 135–145)

## 2021-03-04 LAB — PROTIME-INR
INR: 0.9 (ref 0.8–1.2)
Prothrombin Time: 12.4 seconds (ref 11.4–15.2)

## 2021-03-04 LAB — LIPID PANEL
Cholesterol: 152 mg/dL (ref 0–200)
HDL: 39 mg/dL — ABNORMAL LOW (ref >40)
LDL Cholesterol: 77 mg/dL (ref 0–99)
Total CHOL/HDL Ratio: 3.9 RATIO
Triglycerides: 181 mg/dL — ABNORMAL HIGH (ref <150)
VLDL: 36 mg/dL (ref 0–40)

## 2021-03-04 IMAGING — MR MR HEAD W/O CM
8 of 10 series · 37 of 48 positions shown · non-contrast
Comparison: Noncontrast head CT [DATE]. Brain MRI [DATE].
CT angiogram head/neck [DATE]. CT venogram head [DATE].

CLINICAL DATA: Neuro deficit, acute, stroke suspected.

EXAM:
MRI HEAD WITHOUT CONTRAST
TECHNIQUE: Multiplanar, multiecho pulse sequences of the brain and surrounding
structures were obtained without intravenous contrast.

[Series 3: DWI · axial · 3.0mm · 1.09mm/px · z∈[-77,+62]mm · 9 of 96 slices shown (1 of 4)]
[im 1/96]
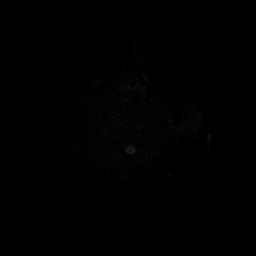
[im 18/96]
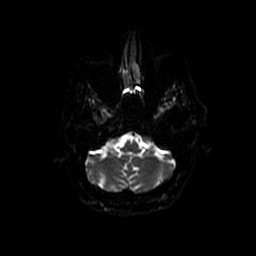
[im 26/96]
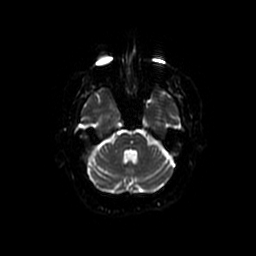
[im 44/96]
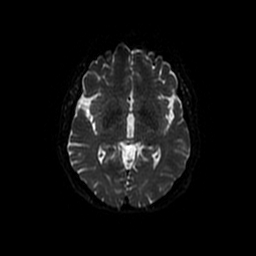
[im 52/96]
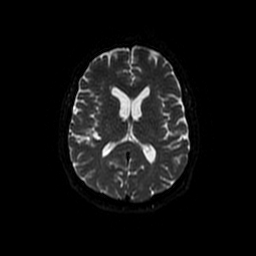
[im 70/96]
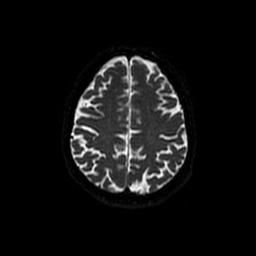
[im 78/96]
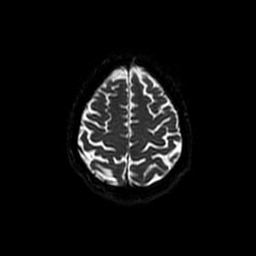
[im 87/96]
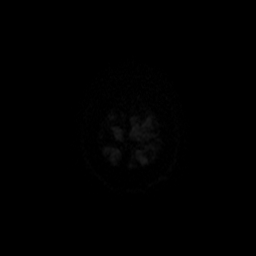
[im 96/96]
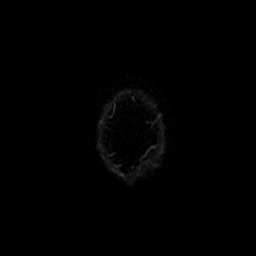

[Series 4: DWI · coronal · 5.0mm · 1.09mm/px · 8 of 68 slices shown (2 of 4)]
[im 1/68]
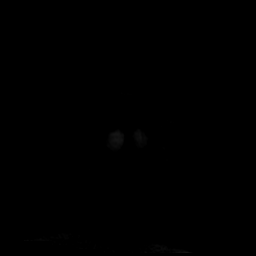
[im 10/68]
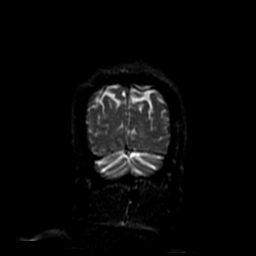
[im 20/68]
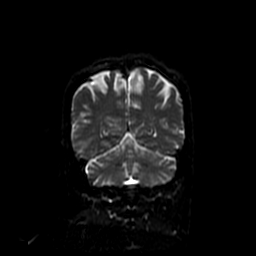
[im 29/68]
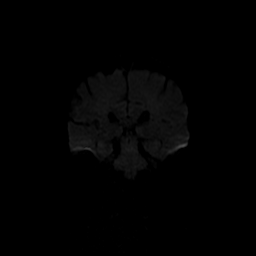
[im 39/68]
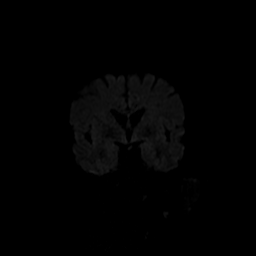
[im 48/68]
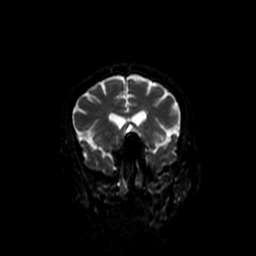
[im 58/68]
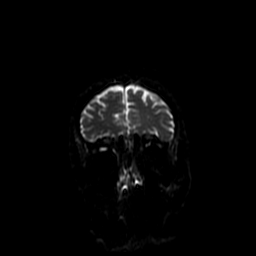
[im 68/68]
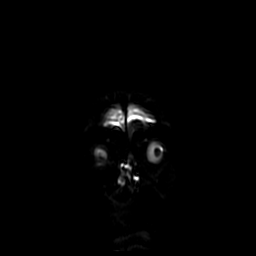

[Series 5: T1 · sagittal · 5.0mm · 0.47mm/px · 1 of 23 slices shown]
[im 1/23]
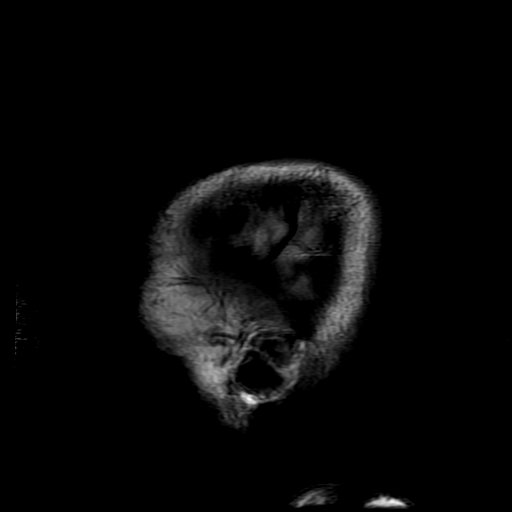

[Series 6: T2 · axial · 5.0mm · 0.43mm/px · z∈[-77,+59]mm · 3 of 24 slices shown (1 of 2)]
[im 1/24]
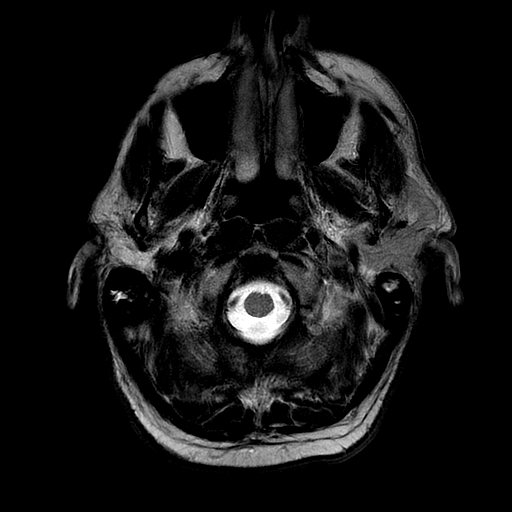
[im 12/24]
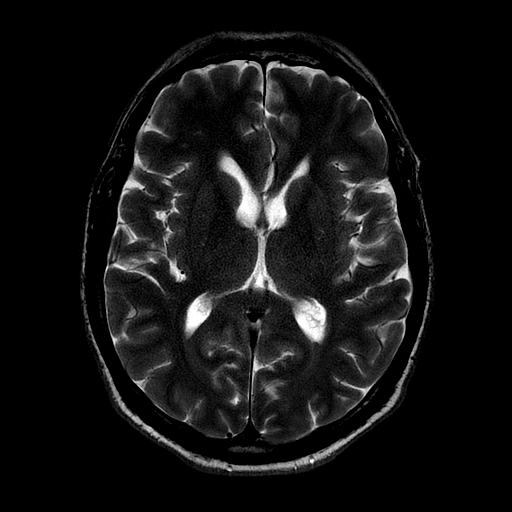
[im 24/24]
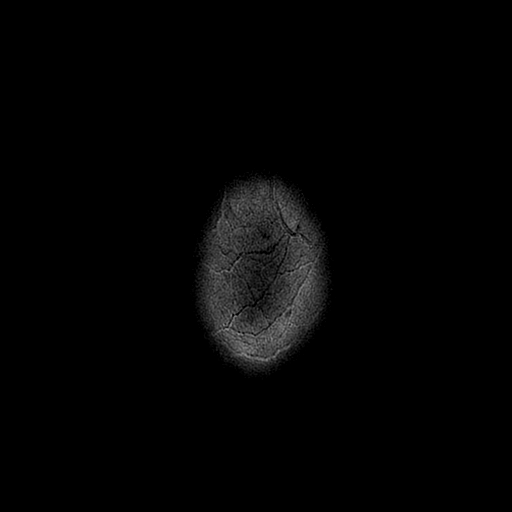

[Series 7: FLAIR · axial · 5.0mm · 0.43mm/px · z∈[-77,+59]mm · 3 of 24 slices shown]
[im 1/24]
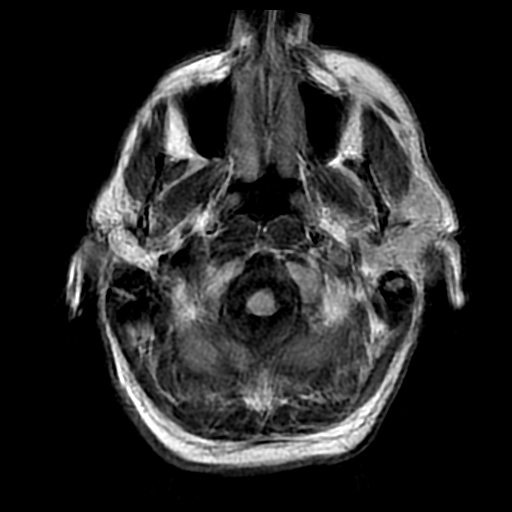
[im 12/24]
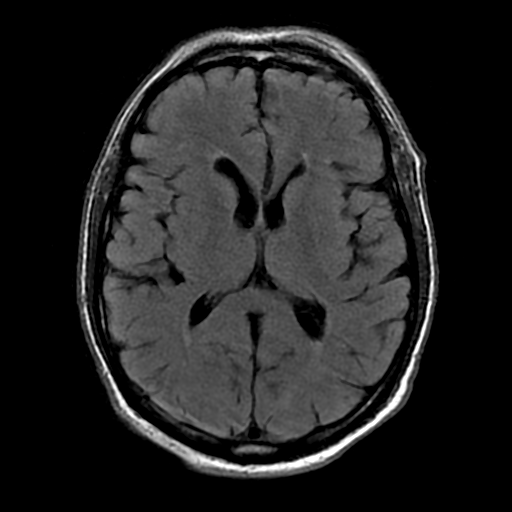
[im 24/24]
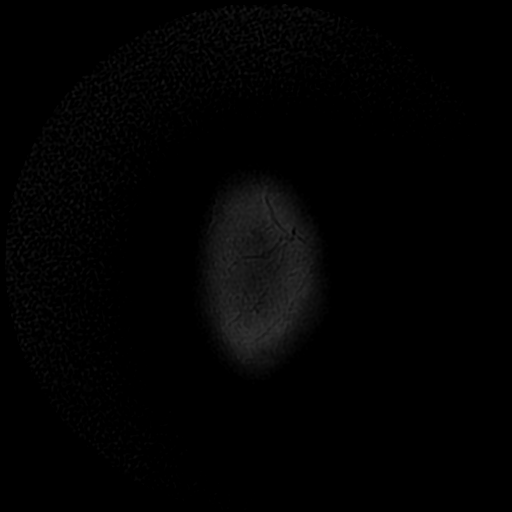

[Series 10: T2 · coronal · 5.0mm · 0.43mm/px · 3 of 28 slices shown (2 of 2)]
[im 1/28]
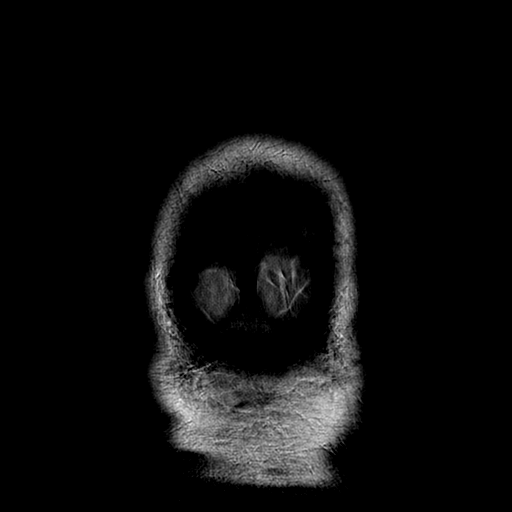
[im 14/28]
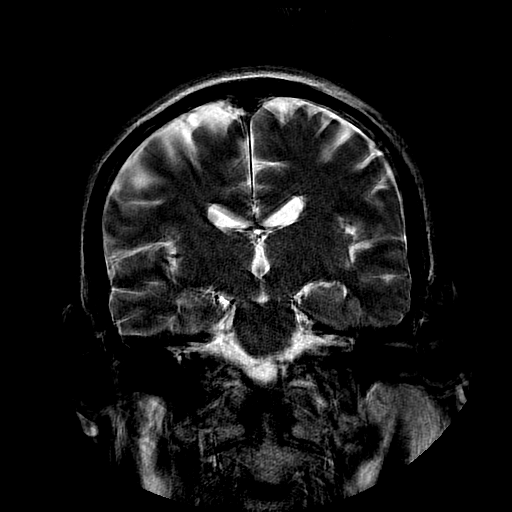
[im 28/28]
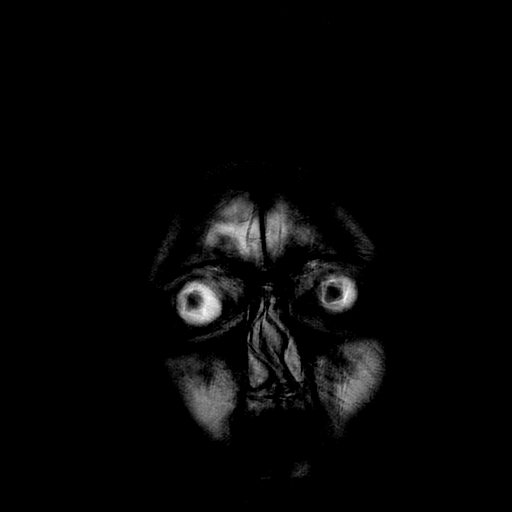

[Series 300: DWI · axial · 3.0mm · 1.09mm/px · z∈[-77,+62]mm · 6 of 48 slices shown (3 of 4)]
[im 1/48]
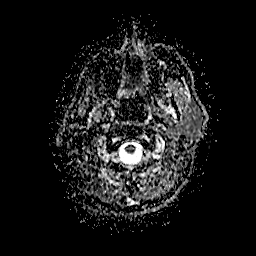
[im 10/48]
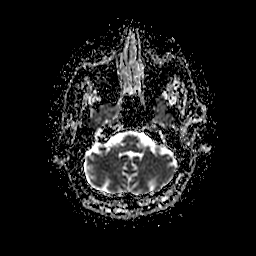
[im 19/48]
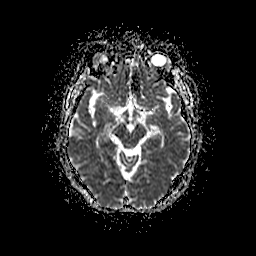
[im 29/48]
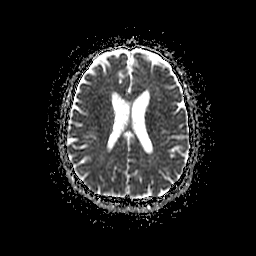
[im 38/48]
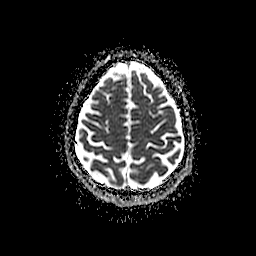
[im 48/48]
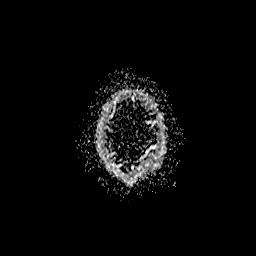

[Series 400: DWI · coronal · 5.0mm · 1.09mm/px · 4 of 34 slices shown (4 of 4)]
[im 1/34]
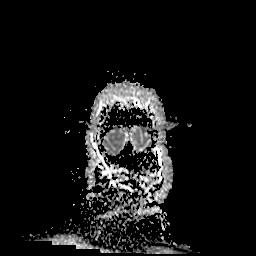
[im 12/34]
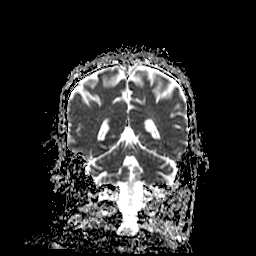
[im 23/34]
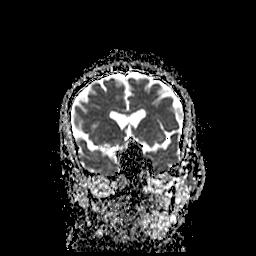
[im 34/34]
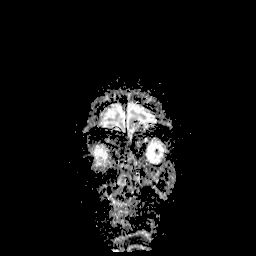

[37 of 48 positions shown; findings below may reference images not displayed]

FINDINGS: Brain:

Intermittently motion degraded examination, limiting evaluation.
Most notably, there is moderate/severe motion degradation of the
sagittal T1 weighted sequence, moderate motion degradation of the
axial T2 FLAIR sequence, moderate/severe motion degradation of the
axial SWI sequence and severe motion degradation of the coronal T2
TSE sequence.

Cerebral volume is normal for age.

New from the prior brain MRI of [DATE], there is subtle
cortically-based restricted diffusion within the medial right
occipital lobe spanning approximately 2.8 cm (for instance as seen
on series 4, image 10). Findings are compatible with acute
infarction.

Minimal multifocal T2 FLAIR hyperintense signal abnormality within
the cerebral white matter, nonspecific but compatible with chronic
small vessel ischemic disease.

3 mm round T2 hyperintense focus within the midline pons, which may
reflect a prominent perivascular space or chronic lacunar infarct
(series 6, image 6).

No evidence of an intracranial mass.

No appreciable intracranial hemorrhage.

No extra-axial fluid collection.

No midline shift.

Vascular: Maintained flow voids within the proximal large arterial
vessels.

Skull and upper cervical spine: No focal suspicious marrow lesion is
identified.

Sinuses/Orbits: Visualized orbits show no acute finding. No
significant paranasal sinus disease.

Other: Bilateral mastoid effusions.
IMPRESSION: Motion degraded examination, as described.

New from the prior brain MRI of [DATE], there is subtle
restricted diffusion within the medial right occipital lobe,
spanning approximately 2.8 cm, compatible with acute infarction
(right PCA vascular territory).

Minimal chronic small-vessel ischemic changes within the cerebral
white matter.

3 mm prominent perivascular space versus chronic lacunar infarct
within the midline pons.

Bilateral mastoid effusions.

## 2021-03-04 MED ORDER — CLOPIDOGREL BISULFATE 75 MG PO TABS
75.0000 mg | ORAL_TABLET | Freq: Every day | ORAL | Status: DC
  Administered 2021-03-04 – 2021-03-07 (×4): 75 mg via ORAL
  Filled 2021-03-04 (×5): qty 1

## 2021-03-04 MED ORDER — ATORVASTATIN CALCIUM 10 MG PO TABS
10.0000 mg | ORAL_TABLET | Freq: Every day | ORAL | Status: DC
  Administered 2021-03-04: 10 mg via ORAL
  Filled 2021-03-04: qty 1

## 2021-03-04 MED ORDER — LORAZEPAM 2 MG/ML IJ SOLN
1.0000 mg | Freq: Once | INTRAMUSCULAR | Status: AC | PRN
  Administered 2021-03-04: 1 mg via INTRAVENOUS
  Filled 2021-03-04: qty 1

## 2021-03-04 MED ORDER — SENNOSIDES-DOCUSATE SODIUM 8.6-50 MG PO TABS
1.0000 | ORAL_TABLET | Freq: Every evening | ORAL | Status: DC | PRN
  Administered 2021-03-11 – 2021-03-12 (×2): 1 via ORAL
  Filled 2021-03-04 (×2): qty 1

## 2021-03-04 MED ORDER — PANTOPRAZOLE SODIUM 40 MG PO TBEC
40.0000 mg | DELAYED_RELEASE_TABLET | Freq: Every morning | ORAL | Status: DC
  Administered 2021-03-05 – 2021-03-13 (×9): 40 mg via ORAL
  Filled 2021-03-04 (×8): qty 1

## 2021-03-04 MED ORDER — ASPIRIN 81 MG PO CHEW
81.0000 mg | CHEWABLE_TABLET | Freq: Every day | ORAL | Status: DC
  Administered 2021-03-04 – 2021-03-13 (×10): 81 mg via ORAL
  Filled 2021-03-04 (×11): qty 1

## 2021-03-04 MED ORDER — ACETAMINOPHEN 160 MG/5ML PO SOLN: 650.0000 mg | ORAL | Status: DC | PRN

## 2021-03-04 MED ORDER — STROKE: EARLY STAGES OF RECOVERY BOOK
Freq: Once | Status: AC
  Administered 2021-03-04: 1
  Filled 2021-03-04: qty 1

## 2021-03-04 MED ORDER — ACETAMINOPHEN 325 MG PO TABS
650.0000 mg | ORAL_TABLET | ORAL | Status: DC | PRN
  Administered 2021-03-04 – 2021-03-11 (×6): 650 mg via ORAL
  Filled 2021-03-04 (×6): qty 2

## 2021-03-04 MED ORDER — HEPARIN SODIUM (PORCINE) 5000 UNIT/ML IJ SOLN
5000.0000 [IU] | Freq: Three times a day (TID) | INTRAMUSCULAR | Status: DC
  Administered 2021-03-04 – 2021-03-07 (×9): 5000 [IU] via SUBCUTANEOUS
  Filled 2021-03-04 (×9): qty 1

## 2021-03-04 MED ORDER — DROPERIDOL 2.5 MG/ML IJ SOLN
2.5000 mg | Freq: Once | INTRAMUSCULAR | Status: AC
  Administered 2021-03-04: 2.5 mg via INTRAVENOUS
  Filled 2021-03-04: qty 2

## 2021-03-04 MED ORDER — ACETAMINOPHEN 650 MG RE SUPP: 650.0000 mg | RECTAL | Status: DC | PRN

## 2021-03-04 MED ORDER — INSULIN ASPART 100 UNIT/ML IJ SOLN
0.0000 [IU] | Freq: Three times a day (TID) | INTRAMUSCULAR | Status: DC
  Administered 2021-03-05: 16:00:00 1 [IU] via SUBCUTANEOUS
  Administered 2021-03-05: 07:00:00 2 [IU] via SUBCUTANEOUS
  Administered 2021-03-05: 21:00:00 1 [IU] via SUBCUTANEOUS
  Administered 2021-03-06: 18:00:00 2 [IU] via SUBCUTANEOUS
  Administered 2021-03-06: 13:00:00 3 [IU] via SUBCUTANEOUS
  Administered 2021-03-07: 5 [IU] via SUBCUTANEOUS
  Administered 2021-03-07: 7 [IU] via SUBCUTANEOUS
  Administered 2021-03-07: 2 [IU] via SUBCUTANEOUS
  Administered 2021-03-08: 3 [IU] via SUBCUTANEOUS
  Administered 2021-03-08: 2 [IU] via SUBCUTANEOUS
  Administered 2021-03-08: 5 [IU] via SUBCUTANEOUS
  Administered 2021-03-09 (×2): 3 [IU] via SUBCUTANEOUS
  Administered 2021-03-09 – 2021-03-10 (×2): 5 [IU] via SUBCUTANEOUS
  Administered 2021-03-10: 2 [IU] via SUBCUTANEOUS
  Administered 2021-03-10: 7 [IU] via SUBCUTANEOUS
  Administered 2021-03-11: 2 [IU] via SUBCUTANEOUS
  Administered 2021-03-11: 5 [IU] via SUBCUTANEOUS
  Administered 2021-03-11: 2 [IU] via SUBCUTANEOUS
  Administered 2021-03-12: 19:00:00 3 [IU] via SUBCUTANEOUS
  Administered 2021-03-12 – 2021-03-13 (×2): 2 [IU] via SUBCUTANEOUS
  Administered 2021-03-13: 5 [IU] via SUBCUTANEOUS

## 2021-03-04 MED ORDER — LABETALOL HCL 5 MG/ML IV SOLN: 10.0000 mg | INTRAVENOUS | Status: DC | PRN

## 2021-03-04 MED ORDER — ONDANSETRON HCL 4 MG/2ML IJ SOLN: 4.0000 mg | Freq: Four times a day (QID) | INTRAMUSCULAR | Status: DC | PRN

## 2021-03-04 MED ORDER — SODIUM CHLORIDE 0.9 % IV BOLUS
1000.0000 mL | Freq: Once | INTRAVENOUS | Status: AC
  Administered 2021-03-04: 1000 mL via INTRAVENOUS

## 2021-03-04 NOTE — ED Triage Notes (Signed)
Wife states pt was seen in ED Thanksgiving night for a headache.  States he is now seeing bright lights and shadows of people since yesterday.  Pt is scared that the "shadows" are going to hurt him.  Also reports pain to R side of face/cheek where he had cancer moved years ago.

## 2021-03-04 NOTE — ED Provider Notes (Signed)
[name] Provider Note   CSN: [id number] Arrival date & time: 03/04/21  1223     History Chief Complaint  Patient presents with   Hallucinations    [name] is a 65 y.o. male.  [name] is a 65 y.o. male with a history of GERD, hiatal hernia, prostate and skin cancer, who presents to the emergency department for evaluation of worsening headache and left-sided hemianopsia.  Patient was seen in the ED 2 days ago for similar symptoms and had negative CT and CTV imaging of the brain, they attempted to do an MRI and patient was medicated with Versed but woke up in the middle of the study and due to anxiety and agitation they could not complete the study.  He returns today with worsening headache that is most severe around the right eye.  Patient continues to have vision changes but also reports that he has had persistent and worsening visual hallucinations of colorful flashing lights as well as shadows that look like people lunging at home.  He has never had hallucinations like this before, no auditory hallucinations.  Dr. Quinn Axe with neurology was consulted from triage and felt strongly that patient needed MRI to evaluate for potential occipital stroke, patient willing to try MRI again with different medication.  The history is provided by the patient, the spouse and medical records.      Past Medical History:  Diagnosis Date   CKD (chronic kidney disease), stage III (HCC)    GERD (gastroesophageal reflux disease)    Hiatal hernia    History of esophageal dilatation    05/ 2004  incompleted ring   Hyperlipidemia associated with type 2 diabetes mellitus ([place])    Hypertension associated with diabetes ([place])    Mohs defect of cheek 2011   rt side/ eye to mandible   Prostate cancer [facility]) urologist-  dr dahlstedt/  oncologist-  dr Tammi Klippel   Stage T1c, Gleason 7, PSA 5.9, vol 27cc   Skin cancer of face 2011   returned 2013-radiation tx     Type 2 diabetes mellitus [facility])     Patient Active Problem List   Diagnosis Date Noted   Acute CVA (cerebrovascular accident) ([place]) 03/05/2021   Occipital infarction ([place]) 03/04/2021   CKD (chronic kidney disease), stage III ([place]) 03/04/2021   Type 2 diabetes mellitus ([place]) 03/04/2021   Hyperlipidemia associated with type 2 diabetes mellitus ([place]) 03/04/2021   Malignant neoplasm of prostate ([place]) 04/30/2016   Incisional hernia, without obstruction or gangrene 12/07/2015   Incisional hernia with obstruction 12/07/2015    Past Surgical History:  Procedure Laterality Date   COLONOSCOPY     CYSTOSCOPY N/A 06/29/2016   Procedure: CYSTOSCOPY FLEXIBLE;  Surgeon: Franchot Gallo, MD;  Location: [facility];  Service: Urology;  Laterality: N/A;  no seeds found in bladder   ESOPHAGOGASTRODUODENOSCOPY (EGD) WITH ESOPHAGEAL DILATION  08/2002   HEMORRHOID SURGERY     INCISIONAL HERNIA REPAIR N/A 12/07/2015   Procedure: LAPAROSCOPIC INCISIONAL HERNIA WITH MESH ;  Surgeon: Fanny Skates, MD;  Location: [place];  Service: General;  Laterality: N/A;   LAPAROSCOPIC CHOLECYSTECTOMY  2010   PROSTATE BIOPSY     RADIOACTIVE SEED IMPLANT N/A 06/29/2016   Procedure: RADIOACTIVE SEED IMPLANT/BRACHYTHERAPY IMPLANT;  Surgeon: Franchot Gallo, MD;  Location: [facility];  Service: Urology;  Laterality: N/A;  86 seeds implanted    SKIN GRAFT  2013,2011   from neck to face on right  side/ skin cancer and tumor       Family History  Problem Relation Age of Onset   Cancer Mother        colon   Stroke Brother     Social History   Tobacco Use   Smoking status: Former    Packs/day: 0.50    Years: 10.00    Pack years: 5.00    Types: Cigarettes    Quit date: 04/09/1978    Years since quitting: 42.9   Smokeless tobacco: Never   Tobacco comments:    QUIT SMOKING IN THE 1980'S  Substance Use Topics   Alcohol use: No   Drug use: No    Home Medications Prior to  Admission medications   Medication Sig Start Date End Date Taking? Authorizing Provider  atorvastatin (LIPITOR) 10 MG tablet Take 10 mg by mouth at bedtime. 12/30/20  Yes [provider]  empagliflozin (JARDIANCE) 25 MG TABS tablet Take 25 mg by mouth every morning. 05/11/20  Yes [provider]  ibuprofen (ADVIL,MOTRIN) 200 MG tablet Take 600 mg by mouth every 6 (six) hours as needed for moderate pain.   Yes [provider]  metFORMIN (GLUCOPHAGE) 500 MG tablet Take 1 tablet (500 mg total) by mouth 2 (two) times daily with a meal. 01/25/18  Yes Rolland Porter, MD  Naphazoline HCl (CLEAR EYES OP) Place 1 drop into both eyes at bedtime as needed (dry eyes).   Yes [provider]  nitroGLYCERIN (NITROSTAT) 0.3 MG SL tablet Place 0.3 mg under the tongue every 5 (five) minutes as needed for chest pain.   Yes [provider]  OVER THE COUNTER MEDICATION Take 1 tablet by mouth once. Over the counter sinus medication   Yes [provider]  pantoprazole (PROTONIX) 40 MG tablet Take 40 mg by mouth every morning. 11/17/15  Yes [provider]  ramipril (ALTACE) 5 MG capsule Take 5 mg by mouth every morning. 12/13/20  Yes [provider]  vitamin B-12 (CYANOCOBALAMIN) 500 MCG tablet Take 500 mcg by mouth daily.   Yes [provider]  Fluorouracil 4 % CREA Apply 1 application topically 2 (two) times daily. Patient not taking: Reported on 03/04/2021    [provider]    Allergies    Actos [pioglitazone], Azithromycin, Ciprofloxacin, and Codeine  Review of Systems   Review of Systems  Constitutional:  Negative for chills and fever.  HENT: Negative.    Eyes:  Positive for visual disturbance. Negative for pain.  Respiratory:  Negative for shortness of breath.   Cardiovascular:  Negative for chest pain.  Gastrointestinal:  Negative for abdominal pain, nausea and vomiting.  Genitourinary:  Negative for dysuria.   Musculoskeletal:  Negative for arthralgias and neck pain.  Skin:  Negative for color change and rash.  Neurological:  Positive for headaches. Negative for dizziness, syncope, facial asymmetry, speech difficulty, weakness, light-headedness and numbness.  Psychiatric/Behavioral:  Positive for hallucinations.   All other systems reviewed and are negative.  Physical Exam Updated Vital Signs BP (!) 141/86 (BP Location: Right Arm)   Pulse 90   Temp 97.8 F (36.6 C) (Oral)   Resp 17   SpO2 97%   Physical Exam Vitals and nursing note reviewed.  Constitutional:      General: He is not in acute distress.    Appearance: Normal appearance. He is well-developed. He is not diaphoretic.  HENT:     Head: Normocephalic and atraumatic.     Comments: Mohs Defect to  right cheek and intraorbital region    Mouth/Throat:     Mouth: Mucous membranes are moist.     Pharynx: Oropharynx is clear.  Eyes:     General:        Right eye: No discharge.        Left eye: No discharge.     Pupils: Pupils are equal, round, and reactive to light.     Comments: Left hemianopsia, EOMs intact, patient sometimes has difficulty looking to the left, PERRLA, no nystagmus  Cardiovascular:     Rate and Rhythm: Normal rate and regular rhythm.     Pulses: Normal pulses.     Heart sounds: Normal heart sounds.  Pulmonary:     Effort: Pulmonary effort is normal. No respiratory distress.     Breath sounds: Normal breath sounds. No wheezing or rales.     Comments: Respirations equal and unlabored, patient able to speak in full sentences, lungs clear to auscultation bilaterally  Abdominal:     General: Bowel sounds are normal. There is no distension.     Palpations: Abdomen is soft. There is no mass.     Tenderness: There is no abdominal tenderness. There is no guarding.     Comments: Abdomen soft, nondistended, nontender to palpation in all quadrants without guarding or peritoneal signs  Musculoskeletal:        General:  No deformity.     Cervical back: Neck supple.  Skin:    General: Skin is warm and dry.     Capillary Refill: Capillary refill takes less than 2 seconds.  Neurological:     Mental Status: He is alert and oriented to person, place, and time.     Coordination: Coordination normal.     Comments: Speech is clear, able to follow commands Left hemianopsia but no other cranial nerve deficits noted Normal strength in upper and lower extremities bilaterally including dorsiflexion and plantar flexion, strong and equal grip strength Sensation normal to light and sharp touch Moves extremities without ataxia, coordination intact  Psychiatric:        Attention and Perception: He perceives visual hallucinations.        Mood and Affect: Mood normal.        Behavior: Behavior normal.    ED Results / Procedures / Treatments   Labs (all labs ordered are listed, but only abnormal results are displayed) Labs Reviewed  BASIC METABOLIC PANEL - Abnormal; Notable for the following components:      Result Value   CO2 13 (*)    Glucose, Bld 200 (*)    BUN 35 (*)    Creatinine, Ser 1.81 (*)    GFR, Estimated 41 (*)    Anion gap 18 (*)    All other components within normal limits  LIPID PANEL - Abnormal; Notable for the following components:   Triglycerides 181 (*)    HDL 39 (*)    All other components within normal limits  BASIC METABOLIC PANEL - Abnormal; Notable for the following components:   Sodium 131 (*)    CO2 14 (*)    Glucose, Bld 213 (*)    BUN 34 (*)    Creatinine, Ser 1.81 (*)    GFR, Estimated 41 (*)    All other components within normal limits  GLUCOSE, CAPILLARY - Abnormal; Notable for the following components:   Glucose-Capillary 164 (*)    All other components within normal limits  GLUCOSE, CAPILLARY - Abnormal; Notable for the following components:  Glucose-Capillary 179 (*)    All other components within normal limits  GLUCOSE, CAPILLARY - Abnormal; Notable for the following  components:   Glucose-Capillary 133 (*)    All other components within normal limits  CBC WITH DIFFERENTIAL/PLATELET  PROTIME-INR  HIV ANTIBODY (ROUTINE TESTING W REFLEX)  CBC  RAPID URINE DRUG SCREEN, HOSP PERFORMED  HEMOGLOBIN A1C  PHENYTOIN LEVEL, TOTAL    EKG None  Radiology MR BRAIN WO CONTRAST  Result Date: 03/04/2021 CLINICAL DATA:  Neuro deficit, acute, stroke suspected. EXAM: MRI HEAD WITHOUT CONTRAST TECHNIQUE: Multiplanar, multiecho pulse sequences of the brain and surrounding structures were obtained without intravenous contrast. COMPARISON:  Noncontrast head CT 03/02/2021. Brain MRI 03/02/2021. CT angiogram head/neck 03/03/2021. CT venogram head 03/03/2021. FINDINGS: Brain: Intermittently motion degraded examination, limiting evaluation. Most notably, there is moderate/severe motion degradation of the sagittal T1 weighted sequence, moderate motion degradation of the axial T2 FLAIR sequence, moderate/severe motion degradation of the axial SWI sequence and severe motion degradation of the coronal T2 TSE sequence. Cerebral volume is normal for age. New from the prior brain MRI of 03/02/2021, there is subtle cortically-based restricted diffusion within the medial right occipital lobe spanning approximately 2.8 cm (for instance as seen on series 4, image 10). Findings are compatible with acute infarction. Minimal multifocal T2 FLAIR hyperintense signal abnormality within the cerebral white matter, nonspecific but compatible with chronic small vessel ischemic disease. 3 mm round T2 hyperintense focus within the midline pons, which may reflect a prominent perivascular space or chronic lacunar infarct (series 6, image 6). No evidence of an intracranial mass. No appreciable intracranial hemorrhage. No extra-axial fluid collection. No midline shift. Vascular: Maintained flow voids within the proximal large arterial vessels. Skull and upper cervical spine: No focal suspicious marrow lesion is  identified. Sinuses/Orbits: Visualized orbits show no acute finding. No significant paranasal sinus disease. Other: Bilateral mastoid effusions. IMPRESSION: Motion degraded examination, as described. New from the prior brain MRI of 03/02/2021, there is subtle restricted diffusion within the medial right occipital lobe, spanning approximately 2.8 cm, compatible with acute infarction (right PCA vascular territory). Minimal chronic small-vessel ischemic changes within the cerebral white matter. 3 mm prominent perivascular space versus chronic lacunar infarct within the midline pons. Bilateral mastoid effusions. Electronically Signed   By: Kellie Simmering D.O.   On: 03/04/2021 17:14    Procedures Procedures   Medications Ordered in ED Medications  aspirin chewable tablet 81 mg (81 mg Oral Given 03/05/21 1037)  clopidogrel (PLAVIX) tablet 75 mg (75 mg Oral Given 03/05/21 1037)  droperidol (INAPSINE) 2.5 MG/ML injection 2.5 mg (2.5 mg Intravenous Given 03/04/21 1519)  LORazepam (ATIVAN) injection 1 mg (1 mg Intravenous Given 03/04/21 1601)  sodium chloride 0.9 % bolus 1,000 mL (0 mLs Intravenous Stopped 03/04/21 2000)    ED Course  I have reviewed the triage vital signs and the nursing notes.  Pertinent labs & imaging results that were available during my care of the patient were reviewed by me and considered in my medical decision making (see chart for details).    MDM Rules/Calculators/A&P                           65 year old male presents with persistent visual deficits, headache and now having persistent visual hallucinations of flashes of light and human like shadows wandering at home.  He was seen for the same 2 days ago and had negative CT imaging, they attempted to get MRI but patient  woke up during the study with claustrophobia and could not complete the study.  He returns today due to persistent and worsening symptoms.  Dr. Quinn Axe with neurology consulted in triage, is highly suspicious for  occipital infarct, Sherran Needs syndrome can lead to persistent shadow like visual hallucinations with this type of infarct.  Patient is willing to try MRI again with different medications used for sedation and claustrophobia.  Given patient's persistent headache will give droperidol to help with sedation as well as with headache and then will give Ativan just prior to MRI.  I have independently ordered, reviewed and interpreted all labs and imaging: CBC: no leukocytosis, normal hemoglobin BMP: Glucose of 200 and CO2 of 13, anion gap of 18 which is changed from 2 days ago, but patient does not appear to be in DKA, will give IV fluids and repeat metabolic panel  MRI is consistent with a medial right occipital lobe infarct measuring 2.8 cm.    MRI findings explain patient's symptoms, discussed these results with patient and wife.  Will consult neurology and plan for admission for stroke work-up.  Case discussed with Dr. Quinn Axe, neurology will see patient, will go ahead and start patient on dual antiplatelet therapy and they recommend medicine admission.  Case discussed with Dr. Posey Pronto with [facility] hospitalist who will see and admit the patient.  Final Clinical Impression(s) / ED Diagnoses Final diagnoses:  Occipital infarction [facility])    Rx / DC Orders ED Discharge Orders     None        Janet Berlin 03/05/21 1559    Isla Pence, MD 03/05/21 1601

## 2021-03-04 NOTE — ED Notes (Signed)
Patient transported to MRI 

## 2021-03-04 NOTE — H&P (Signed)
History and Physical    CUSTER PIMENTA YPP:[id number] DOB: [birth year] DOA: 03/04/2021  PCP: [name], DO  Patient coming from: Home  I have personally briefly reviewed patient's old medical records in [place]  Chief Complaint: Headache, visual changes  HPI: [name] is a 65 y.o. male with medical history significant for type 2 diabetes, HLD, CKD stage IIIa, prostate cancer who presents to the ED for persistent headache and change in vision.  History is supplemented by patient's spouse at bedside.  Patient initially began to have what he thought were migraine headaches 1 week ago.  He was having visual changes described as seeing flashing lights and stars.  Headache was intermittently resolved however over the last few days became more frequent.  He has been having intermittent loss of vision in his left field of gaze.  Patient presented to the ED evening of 11/24 through morning of 11/25 for evaluation of headache and new left hemianopsia. CT head without contrast was negative for acute intracranial findings.  MRI brain was limited evaluation due to patient's intolerance.  CTA head/neck and CT venogram negative for intracranial LVO or significant stenosis, significant stenosis of the neck, or venous sinus stenosis or thrombosis.  Patient was discharged home with strict return precautions.  Patient returned to the ED with persistent left-sided visual field deficit with new sensation of seeing shadowy figures which would lunged towards him.  He has not had any weakness in his arms or legs or numbness/tingling.  Patient reports a former history of tobacco use, quitting many years ago.  He reports a history of stroke in his brother who died at age 53 due to consequence of the stroke.  Patient denies any history of hypertension, he is taking low-dose ACE inhibitor for its renal protective effects in the setting of diabetes.  ED Course:  Initial vitals showed BP 141/86, pulse  90, RR 17, temp 97.8 F, SPO2 97% on room air.  Labs show sodium 136, potassium 4.3, bicarb 13, BUN 35, creatinine 1.81, serum glucose 200, WBC 8.7, hemoglobin 16.3, platelets 238,000, INR 0.9.  Patient was given Ativan and droperidol.  MRI brain was obtained and motion degraded but new hospital lobe acute infarction was seen.  Minimal chronic small vessel ischemic changes within the cerebral white matter and 3 mm prominent perivascular space versus chronic lacunar infarct within the left pons noted.  Neurology consulted and have started patient on aspirin 81 mg and Plavix 75 mg daily x21 days to be followed by aspirin alone.  Patient was given 1 L normal saline.  The hospitalist service was consulted to admit for further evaluation and management.  Review of Systems: All systems reviewed and are negative except as documented in history of present illness above.   Past Medical History:  Diagnosis Date   CKD (chronic kidney disease), stage III (HCC)    GERD (gastroesophageal reflux disease)    Hiatal hernia    History of esophageal dilatation    05/ 2004  incompleted ring   Hyperlipidemia associated with type 2 diabetes mellitus ([place])    Hypertension associated with diabetes ([place])    Mohs defect of cheek 2011   rt side/ eye to mandible   Prostate cancer [facility]) urologist-  dr dahlstedt/  oncologist-  dr Tammi Klippel   Stage T1c, Gleason 7, PSA 5.9, vol 27cc   Skin cancer of face 2011   returned 2013-radiation tx    Type 2 diabetes mellitus ([place])  Past Surgical History:  Procedure Laterality Date   COLONOSCOPY     CYSTOSCOPY N/A 06/29/2016   Procedure: CYSTOSCOPY FLEXIBLE;  Surgeon: Franchot Gallo, MD;  Location: [facility];  Service: Urology;  Laterality: N/A;  no seeds found in bladder   ESOPHAGOGASTRODUODENOSCOPY (EGD) WITH ESOPHAGEAL DILATION  08/2002   HEMORRHOID SURGERY     INCISIONAL HERNIA REPAIR N/A 12/07/2015   Procedure: LAPAROSCOPIC INCISIONAL HERNIA WITH  MESH ;  Surgeon: Fanny Skates, MD;  Location: [place];  Service: General;  Laterality: N/A;   LAPAROSCOPIC CHOLECYSTECTOMY  2010   PROSTATE BIOPSY     RADIOACTIVE SEED IMPLANT N/A 06/29/2016   Procedure: RADIOACTIVE SEED IMPLANT/BRACHYTHERAPY IMPLANT;  Surgeon: Franchot Gallo, MD;  Location: [facility];  Service: Urology;  Laterality: N/A;  86 seeds implanted    SKIN GRAFT  2013,2011   from neck to face on right side/ skin cancer and tumor    Social History:  reports that he quit smoking about 42 years ago. His smoking use included cigarettes. He has a 5.00 pack-year smoking history. He has never used smokeless tobacco. He reports that he does not drink alcohol and does not use drugs.  Allergies  Allergen Reactions   Actos [Pioglitazone] Shortness Of Breath and Swelling    Abdomen swelling   Azithromycin Anaphylaxis and Hives   Ciprofloxacin Hives and Anaphylaxis   Codeine Nausea And Vomiting    Other reaction(s): Agitation    Family History  Problem Relation Age of Onset   Cancer Mother        colon   Stroke Brother      Prior to Admission medications   Medication Sig Start Date End Date Taking? Authorizing Provider  atorvastatin (LIPITOR) 10 MG tablet Take 10 mg by mouth at bedtime. 12/30/20  Yes [provider]  empagliflozin (JARDIANCE) 25 MG TABS tablet Take 25 mg by mouth every morning. 05/11/20  Yes [provider]  ibuprofen (ADVIL,MOTRIN) 200 MG tablet Take 600 mg by mouth every 6 (six) hours as needed for moderate pain.   Yes [provider]  metFORMIN (GLUCOPHAGE) 500 MG tablet Take 1 tablet (500 mg total) by mouth 2 (two) times daily with a meal. 01/25/18  Yes Rolland Porter, MD  Naphazoline HCl (CLEAR EYES OP) Place 1 drop into both eyes at bedtime as needed (dry eyes).   Yes [provider]  nitroGLYCERIN (NITROSTAT) 0.3 MG SL tablet Place 0.3 mg under the tongue every 5 (five) minutes as needed for chest pain.   Yes  [provider]  OVER THE COUNTER MEDICATION Take 1 tablet by mouth once. Over the counter sinus medication   Yes [provider]  pantoprazole (PROTONIX) 40 MG tablet Take 40 mg by mouth every morning. 11/17/15  Yes [provider]  ramipril (ALTACE) 5 MG capsule Take 5 mg by mouth every morning. 12/13/20  Yes [provider]  vitamin B-12 (CYANOCOBALAMIN) 500 MCG tablet Take 500 mcg by mouth daily.   Yes [provider]  Fluorouracil 4 % CREA Apply 1 application topically 2 (two) times daily. Patient not taking: Reported on 03/04/2021    [provider]    Physical Exam: Vitals:   03/04/21 1501 03/04/21 1604 03/04/21 1737 03/04/21 1823  BP: 126/73 127/75 (!) 155/86 (!) 150/85  Pulse: 65 62 (!) 108 100  Resp: 14 16 15 16   Temp: 98.1 F (36.7 C) 98.1 F (36.7 C) 98.5 F (36.9 C) 98.5 F (36.9 C)  TempSrc: Oral Oral Oral Oral  SpO2: 98% 98% 97% 98%   Constitutional: In bed in the left lateral decubitus position, vomiting on initial exam, anxious, somewhat somnolent related to medication effect Eyes: PERRL, left-sided visual field deficit ENMT: Mucous membranes are dry. Posterior pharynx clear of any exudate or lesions.Normal dentition.  Neck: normal, supple, no masses. Respiratory: clear to auscultation bilaterally, no wheezing, no crackles. Normal respiratory effort. No accessory muscle use.  Cardiovascular: Regular rate and rhythm, no murmurs / rubs / gallops. No extremity edema. 2+ pedal pulses. Abdomen: no tenderness, no masses palpated. No hepatosplenomegaly.  Musculoskeletal: no clubbing / cyanosis. No joint deformity upper and lower extremities. Good ROM, no contractures. Normal muscle tone.  Skin: no rashes, lesions, ulcers. No induration Neurologic: Left sided visual field deficit otherwise CN 2-12 grossly intact. Sensation intact. Strength 5/5 in all 4.  Psychiatric: Normal judgment and insight. Alert and oriented x 3.   Anxious mood.   Labs on Admission: I have personally reviewed following labs and imaging studies  CBC: Recent Labs  Lab 03/02/21 1616 03/04/21 1223  WBC 7.6 8.7  NEUTROABS 6.1 6.7  HGB 14.8 16.3  HCT 43.6 49.1  MCV 87.4 89.3  PLT 212 585   Basic Metabolic Panel: Recent Labs  Lab 03/02/21 1616 03/04/21 1223  NA 132* 136  K 4.8 4.3  CL 99 105  CO2 20* 13*  GLUCOSE 371* 200*  BUN 37* 35*  CREATININE 2.05* 1.81*  CALCIUM 9.7 9.9   GFR: Estimated Creatinine Clearance: 43.5 mL/min (A) (by C-G formula based on SCr of 1.81 mg/dL (H)). Liver Function Tests: Recent Labs  Lab 03/02/21 1616  AST 12*  ALT 21  ALKPHOS 86  BILITOT 0.9  PROT 7.4  ALBUMIN 4.6   Recent Labs  Lab 03/02/21 1616  LIPASE 37   No results for input(s): AMMONIA in the last 168 hours. Coagulation Profile: Recent Labs  Lab 03/04/21 1223  INR 0.9   Cardiac Enzymes: No results for input(s): CKTOTAL, CKMB, CKMBINDEX, TROPONINI in the last 168 hours. BNP (last 3 results) No results for input(s): PROBNP in the last 8760 hours. HbA1C: No results for input(s): HGBA1C in the last 72 hours. CBG: Recent Labs  Lab 03/02/21 1524  GLUCAP 379*   Lipid Profile: Recent Labs    03/04/21 1322  CHOL 152  HDL 39*  LDLCALC 77  TRIG 181*  CHOLHDL 3.9   Thyroid Function Tests: No results for input(s): TSH, T4TOTAL, FREET4, T3FREE, THYROIDAB in the last 72 hours. Anemia Panel: No results for input(s): VITAMINB12, FOLATE, FERRITIN, TIBC, IRON, RETICCTPCT in the last 72 hours. Urine analysis:    Component Value Date/Time   COLORURINE STRAW (A) 01/25/2018 0034   APPEARANCEUR CLEAR 01/25/2018 0034   LABSPEC 1.027 01/25/2018 0034   PHURINE 5.0 01/25/2018 0034   GLUCOSEU >=500 (A) 01/25/2018 0034   HGBUR NEGATIVE 01/25/2018 0034   BILIRUBINUR NEGATIVE 01/25/2018 0034   KETONESUR 20 (A) 01/25/2018 0034   PROTEINUR NEGATIVE 01/25/2018 0034   UROBILINOGEN 0.2 04/10/2007 2318   NITRITE NEGATIVE  01/25/2018 0034   LEUKOCYTESUR NEGATIVE 01/25/2018 0034    Radiological Exams on Admission: CT ANGIO HEAD NECK W WO CM  Result Date: 03/03/2021 CLINICAL DATA:  Headache, stroke suspected EXAM: CT ANGIOGRAPHY HEAD AND NECK CT VENOGRAM HEAD TECHNIQUE: Multidetector CT imaging of the head and neck was performed using the standard protocol during bolus administration of intravenous contrast. Multiplanar CT image reconstructions and MIPs were obtained to evaluate the vascular anatomy.  Carotid stenosis measurements (when applicable) are obtained utilizing NASCET criteria, using the distal internal carotid diameter as the denominator. Venographic phase images of the brain were obtained following the administration of intravenous contrast. Multiplanar reformats and maximum intensity projections were generated. CONTRAST:  28mL OMNIPAQUE IOHEXOL 350 MG/ML SOLN COMPARISON:  03/02/2021 CT head, 03/02/2021 MRI head. FINDINGS: CT HEAD FINDINGS For noncontrast findings, please see same day CT head. CTA NECK FINDINGS Aortic arch: Standard branching. Imaged portion shows no evidence of aneurysm or dissection. No significant stenosis of the major arch vessel origins. Right carotid system: No evidence of dissection, stenosis (50% or greater) or occlusion. Left carotid system: No evidence of dissection, stenosis (50% or greater) or occlusion. Vertebral arteries: No evidence of dissection, stenosis (50% or greater) or occlusion. Skeleton: No acute osseous abnormality. Other neck: Atrophy of the right parotid and submandibular glands. Otherwise negative. Upper chest: No focal pulmonary opacity or pleural effusion. Review of the MIP images confirms the above findings CTA HEAD FINDINGS Anterior circulation: Both internal carotid arteries are patent to the termini, without stenosis or other abnormality. A1 segments patent. Normal anterior communicating artery. Azygous ACA. Anterior cerebral arteries are patent to their distal  aspects. No M1 stenosis or occlusion. Normal MCA bifurcations. Distal MCA branches perfused and symmetric. Posterior circulation: Vertebral arteries patent to the vertebrobasilar junction without stenosis. Basilar patent to its distal aspect. Superior cerebellar arteries patent bilaterally. Fetal origin of the right PCA. PCAs perfused to their distal aspects without stenosis. Visualized right posterior communicating artery. The left posterior communicating artery is not visualized. Anatomic variants: None significant. Review of the MIP images confirms the above findings. CTV FINDINGS No venous sinus stenosis or thrombosis. IMPRESSION: 1. No intracranial large vessel occlusion or significant stenosis. 2. No hemodynamically significant stenosis in the neck. 3. No venous sinus stenosis or thrombosis. Electronically Signed   By: Merilyn Baba M.D.   On: 03/03/2021 02:10   MR BRAIN WO CONTRAST  Result Date: 03/04/2021 CLINICAL DATA:  Neuro deficit, acute, stroke suspected. EXAM: MRI HEAD WITHOUT CONTRAST TECHNIQUE: Multiplanar, multiecho pulse sequences of the brain and surrounding structures were obtained without intravenous contrast. COMPARISON:  Noncontrast head CT 03/02/2021. Brain MRI 03/02/2021. CT angiogram head/neck 03/03/2021. CT venogram head 03/03/2021. FINDINGS: Brain: Intermittently motion degraded examination, limiting evaluation. Most notably, there is moderate/severe motion degradation of the sagittal T1 weighted sequence, moderate motion degradation of the axial T2 FLAIR sequence, moderate/severe motion degradation of the axial SWI sequence and severe motion degradation of the coronal T2 TSE sequence. Cerebral volume is normal for age. New from the prior brain MRI of 03/02/2021, there is subtle cortically-based restricted diffusion within the medial right occipital lobe spanning approximately 2.8 cm (for instance as seen on series 4, image 10). Findings are compatible with acute infarction. Minimal  multifocal T2 FLAIR hyperintense signal abnormality within the cerebral white matter, nonspecific but compatible with chronic small vessel ischemic disease. 3 mm round T2 hyperintense focus within the midline pons, which may reflect a prominent perivascular space or chronic lacunar infarct (series 6, image 6). No evidence of an intracranial mass. No appreciable intracranial hemorrhage. No extra-axial fluid collection. No midline shift. Vascular: Maintained flow voids within the proximal large arterial vessels. Skull and upper cervical spine: No focal suspicious marrow lesion is identified. Sinuses/Orbits: Visualized orbits show no acute finding. No significant paranasal sinus disease. Other: Bilateral mastoid effusions. IMPRESSION: Motion degraded examination, as described. New from the prior brain MRI of 03/02/2021, there is subtle restricted diffusion within  the medial right occipital lobe, spanning approximately 2.8 cm, compatible with acute infarction (right PCA vascular territory). Minimal chronic small-vessel ischemic changes within the cerebral white matter. 3 mm prominent perivascular space versus chronic lacunar infarct within the midline pons. Bilateral mastoid effusions. Electronically Signed   By: Kellie Simmering D.O.   On: 03/04/2021 17:14   MR BRAIN WO CONTRAST  Result Date: 03/02/2021 CLINICAL DATA:  Intermittent headaches with visual changes EXAM: MRI HEAD WITHOUT CONTRAST TECHNIQUE: Multiplanar, multiecho pulse sequences of the brain and surrounding structures were obtained without intravenous contrast. COMPARISON:  None. FINDINGS: Evaluation is limited as patient could not tolerate more than the axial and coronal diffusion-weighted sequences. The sequences demonstrate no restricted diffusion to suggest acute or subacute infarct. IMPRESSION: Limited evaluation as patient could not tolerate the exam. Within this limitation, no acute or subacute infarct. Electronically Signed   By: Merilyn Baba  M.D.   On: 03/02/2021 23:34   CT VENOGRAM HEAD  Result Date: 03/03/2021 CLINICAL DATA:  Headache, stroke suspected EXAM: CT ANGIOGRAPHY HEAD AND NECK CT VENOGRAM HEAD TECHNIQUE: Multidetector CT imaging of the head and neck was performed using the standard protocol during bolus administration of intravenous contrast. Multiplanar CT image reconstructions and MIPs were obtained to evaluate the vascular anatomy. Carotid stenosis measurements (when applicable) are obtained utilizing NASCET criteria, using the distal internal carotid diameter as the denominator. Venographic phase images of the brain were obtained following the administration of intravenous contrast. Multiplanar reformats and maximum intensity projections were generated. CONTRAST:  47mL OMNIPAQUE IOHEXOL 350 MG/ML SOLN COMPARISON:  03/02/2021 CT head, 03/02/2021 MRI head. FINDINGS: CT HEAD FINDINGS For noncontrast findings, please see same day CT head. CTA NECK FINDINGS Aortic arch: Standard branching. Imaged portion shows no evidence of aneurysm or dissection. No significant stenosis of the major arch vessel origins. Right carotid system: No evidence of dissection, stenosis (50% or greater) or occlusion. Left carotid system: No evidence of dissection, stenosis (50% or greater) or occlusion. Vertebral arteries: No evidence of dissection, stenosis (50% or greater) or occlusion. Skeleton: No acute osseous abnormality. Other neck: Atrophy of the right parotid and submandibular glands. Otherwise negative. Upper chest: No focal pulmonary opacity or pleural effusion. Review of the MIP images confirms the above findings CTA HEAD FINDINGS Anterior circulation: Both internal carotid arteries are patent to the termini, without stenosis or other abnormality. A1 segments patent. Normal anterior communicating artery. Azygous ACA. Anterior cerebral arteries are patent to their distal aspects. No M1 stenosis or occlusion. Normal MCA bifurcations. Distal MCA  branches perfused and symmetric. Posterior circulation: Vertebral arteries patent to the vertebrobasilar junction without stenosis. Basilar patent to its distal aspect. Superior cerebellar arteries patent bilaterally. Fetal origin of the right PCA. PCAs perfused to their distal aspects without stenosis. Visualized right posterior communicating artery. The left posterior communicating artery is not visualized. Anatomic variants: None significant. Review of the MIP images confirms the above findings. CTV FINDINGS No venous sinus stenosis or thrombosis. IMPRESSION: 1. No intracranial large vessel occlusion or significant stenosis. 2. No hemodynamically significant stenosis in the neck. 3. No venous sinus stenosis or thrombosis. Electronically Signed   By: Merilyn Baba M.D.   On: 03/03/2021 02:10    EKG: Personally reviewed. Sinus rhythm, motion artifact.  Assessment/Plan Principal Problem:   Occipital infarction [facility]) Active Problems:   CKD (chronic kidney disease), stage III (HCC)   Type 2 diabetes mellitus ([place])   Hyperlipidemia associated with type 2 diabetes mellitus ([place])   [name] Addison  is a 65 y.o. male with medical history significant for type 2 diabetes, HLD, CKD stage IIIa, prostate cancer who is admitted with acute ischemic right supra lobe stroke.  Acute ischemic right occipital lobe stroke with left homonymous hemianopsia: MRI brain shows acute medial right occipital lobe infarct. CTA head/neck and CT venogram performed day prior to admission were negative for LVO or hemodynamically significant stenoses. -Neurology following -Echocardiogram bubble study ordered -Started on aspirin 81 mg and Plavix 75 mg daily x21 days to be followed by aspirin alone -Check A1c, lipid panel -Keep on telemetry, continue neurochecks -PT/OT/SLP eval -Allow permissive hypertension up to 220/110, use IV labetalol as needed  [place] Needs syndrome: As per neurology, his hallucinations are a reaction to  loss of vision and should not be treated with antipsychotics or considered a mental health phenomenon.  Type 2 diabetes: Hold metformin and Jardiance, place on SSI.  Check A1c.  CKD stage IIIa: Renal function slightly worsened compared to last labs in 2019.  Unknown recent baseline.  Hold home ramipril and continue to monitor.  Hyperlipidemia: Continue atorvastatin.  DVT prophylaxis: Subcutaneous heparin Code Status: Full code, confirmed on admission Family Communication: Discussed with patient spouse at bedside Disposition Plan: From home, dispo pending clinical progress including further stroke work-up/neurology evaluation, PT/OT/SLP eval Consults called: Neurology Level of care: Telemetry Medical Admission status:  Status is: Observation  The patient remains OBS appropriate and will d/c before 2 midnights.   Zada Finders MD [facility]  If 7PM-7AM, please contact night-coverage [email or web address]  03/04/2021, 8:13 PM

## 2021-03-04 NOTE — ED Provider Notes (Signed)
Emergency Medicine Provider Triage Evaluation Note  [name] , a 65 y.o. male  was evaluated in triage.  Pt complains of worsening Headache and left-sided hemianopsia.  Patient describes visual hallucinations as colorful thinking the lights, that have shadows like people that seem to be lunging at him.  Patient was seen 2 days ago for similar symptoms.  Negative CT scan of brain.  Patient was unable to tolerate MRI due to anxiety at the time.  Dr. Langston Masker on also evaluated patient in triage. Review of Systems  Positive: Vision changes, visual hallucinations,  Negative: Unilateral weakness, facial droop   Physical Exam  BP (!) 141/86 (BP Location: Right Arm)   Pulse 90   Temp 97.8 F (36.6 C) (Oral)   Resp 17   SpO2 97%  Gen:   Awake, no distress   Resp:  Normal effort  MSK:   Moves extremities without difficulty  Other:  Left sided hemianopsia   Medical Decision Making  Medically screening exam initiated at 1:41 PM.  Appropriate orders placed.  [name] was informed that the remainder of the evaluation will be completed by another provider, this initial triage assessment does not replace that evaluation, and the importance of remaining in the ED until their evaluation is complete.  Dr. Langston Masker evaluated patient and consulted neurology. Pt to get IV sedation for MRI of brain   Tonye Pearson, PA-C 03/04/21 1344    Wyvonnia Dusky, MD 03/04/21 1344

## 2021-03-04 NOTE — ED Provider Notes (Signed)
I evaluated the patient in triage as part of his medical screening exam.  He returns with worsening headache and has persistent left-sided hemianopsia with decreased vision in both eyes.  His wife also reports worsening paranoia that the patient is noting "shadows lunging out on him" from region of visual deficits.  He does not have any other localizing stroke symptoms on my initial screening exam.  Unfortunately he was unable to tolerate an MRI of the brain due to claustrophobia on his most recent ED visit yesterday evening.  I discussed the case again with Dr Quinn Axe from neurology, and both neurologist and I feel that this type of presentation is still highly consistent with an occipital infarct, the patient would need an MRI of the brain.  The patient will need IV sedation and monitoring to attempt MRI brain, but is willing to try again.  I have a lower suspicion for retinal detachment given that the symptoms are bilateral and that he has localizing hemianopsia.   Wyvonnia Dusky, MD 03/04/21 1320

## 2021-03-04 NOTE — Consult Note (Signed)
NEUROLOGY CONSULTATION NOTE   Date of service: March 04, 2021 Patient Name: [name] MRN:  [id number] DOB:  May 13, 1955 Reason for consult: "Medial R occipital stroke" Requesting Provider: Lenore Cordia, MD _ _ _   _ __   _ __ _ _  __ __   _ __   __ _  History of Present Illness  [name] is a 65 y.o. male with PMH significant for GERD, CKD 3, HTN, DM2, HLD, history of migraine headaches who presents with hemianopsia x 3 days. Initially presented to ED on 11/24 and was only able to tolerate limited MRI with DWI and ADC images which were negative for an acute stroke. CTA Head and Neck and CT Venogram head was negative for LVO, no sinus venous thrombosis. He was treated with migraine cocktail with improved headache and some improvement in Hemianopsia. He presents again with persistent L hemianopsia. MRI Brain w/o contrast reattempted and demonstrated diffusion restriction in medial R occipital lobe consistent with acute stroke.  Does not smoke, reports last HbA1c was in 9s. Brother passed away from a large stroke and never woke up from it. No prior strokes.  mRS: 0 tNKase/thrombectomy: outside window, no LVO LKW: 02/28/21, unclear time. NIHSS components Score: Comment  1a Level of Conscious 0[x]  1[]  2[]  3[]      1b LOC Questions 0[x]  1[]  2[]       1c LOC Commands 0[x]  1[]  2[]       2 Best Gaze 0[x]  1[]  2[]       3 Visual 0[]  1[]  2[x]  3[]      4 Facial Palsy 0[x]  1[]  2[]  3[]      5a Motor Arm - left 0[x]  1[]  2[]  3[]  4[]  UN[]    5b Motor Arm - Right 0[x]  1[]  2[]  3[]  4[]  UN[]    6a Motor Leg - Left 0[x]  1[]  2[]  3[]  4[]  UN[]    6b Motor Leg - Right 0[x]  1[]  2[]  3[]  4[]  UN[]    7 Limb Ataxia 0[x]  1[]  2[]  3[]  UN[]     8 Sensory 0[x]  1[]  2[]  UN[]      9 Best Language 0[x]  1[]  2[]  3[]      10 Dysarthria 0[x]  1[]  2[]  UN[]      11 Extinct. and Inattention 0[x]  1[]  2[]       TOTAL: 2      ROS   Constitutional Denies weight loss, fever and chills.   HEENT Endorse hemianopsia, no problems with  hearing.   Respiratory Denies SOB and cough.   CV Denies palpitations and CP   GI Denies abdominal pain, nausea, vomiting and diarrhea.   GU Denies dysuria and urinary frequency.   MSK Denies myalgia and joint pain.   Skin Denies rash and pruritus.   Neurological Denies headache and syncope.   Psychiatric Denies recent changes in mood. Denies anxiety and depression.    Past History   Past Medical History:  Diagnosis Date   CKD (chronic kidney disease), stage III ([place])    GERD (gastroesophageal reflux disease)    Hiatal hernia    History of esophageal dilatation    05/ 2004  incompleted ring   Hyperlipidemia associated with type 2 diabetes mellitus ([place])    Hypertension associated with diabetes ([place])    Mohs defect of cheek 2011   rt side/ eye to mandible   Prostate cancer [facility]) urologist-  dr dahlstedt/  oncologist-  dr Tammi Klippel   Stage T1c, Gleason 7, PSA 5.9, vol 27cc   Skin cancer of face 2011   returned 2013-radiation  tx    Type 2 diabetes mellitus [facility])    Past Surgical History:  Procedure Laterality Date   COLONOSCOPY     CYSTOSCOPY N/A 06/29/2016   Procedure: CYSTOSCOPY FLEXIBLE;  Surgeon: Franchot Gallo, MD;  Location: [facility];  Service: Urology;  Laterality: N/A;  no seeds found in bladder   ESOPHAGOGASTRODUODENOSCOPY (EGD) WITH ESOPHAGEAL DILATION  08/2002   HEMORRHOID SURGERY     INCISIONAL HERNIA REPAIR N/A 12/07/2015   Procedure: LAPAROSCOPIC INCISIONAL HERNIA WITH MESH ;  Surgeon: Fanny Skates, MD;  Location: [place];  Service: General;  Laterality: N/A;   LAPAROSCOPIC CHOLECYSTECTOMY  2010   PROSTATE BIOPSY     RADIOACTIVE SEED IMPLANT N/A 06/29/2016   Procedure: RADIOACTIVE SEED IMPLANT/BRACHYTHERAPY IMPLANT;  Surgeon: Franchot Gallo, MD;  Location: [facility];  Service: Urology;  Laterality: N/A;  59 seeds implanted    SKIN GRAFT  2013,2011   from neck to face on right side/ skin cancer and tumor   Family History   Problem Relation Age of Onset   Cancer Mother        colon   Social History   Socioeconomic History   Marital status: Married    Spouse name: Not on file   Number of children: Not on file   Years of education: Not on file   Highest education level: Not on file  Occupational History   Not on file  Tobacco Use   Smoking status: Former    Packs/day: 0.50    Years: 10.00    Pack years: 5.00    Types: Cigarettes    Quit date: 04/09/1978    Years since quitting: 42.9   Smokeless tobacco: Never   Tobacco comments:    QUIT SMOKING IN THE 1980'S  Substance and Sexual Activity   Alcohol use: No   Drug use: No   Sexual activity: Yes  Other Topics Concern   Not on file  Social History Narrative   Not on file   Social Determinants of Health   Financial Resource Strain: Not on file  Food Insecurity: Not on file  Transportation Needs: Not on file  Physical Activity: Not on file  Stress: Not on file  Social Connections: Not on file   Allergies  Allergen Reactions   Actos [Pioglitazone] Shortness Of Breath and Swelling    Abdomen swelling   Azithromycin Anaphylaxis and Hives   Ciprofloxacin Hives and Anaphylaxis   Codeine Nausea And Vomiting    Other reaction(s): Agitation    Medications  (Not in a hospital admission)    Vitals   Vitals:   03/04/21 1501 03/04/21 1604 03/04/21 1737 03/04/21 1823  BP: 126/73 127/75 (!) 155/86 (!) 150/85  Pulse: 65 62 (!) 108 100  Resp: 14 16 15 16   Temp: 98.1 F (36.7 C) 98.1 F (36.7 C) 98.5 F (36.9 C) 98.5 F (36.9 C)  TempSrc: Oral Oral Oral Oral  SpO2: 98% 98% 97% 98%     There is no height or weight on file to calculate BMI.  Physical Exam   General: Laying comfortably in bed; in no acute distress.  HENT: Normal oropharynx and mucosa. Normal external appearance of ears and nose.  Neck: Supple, no pain or tenderness  CV: No JVD. No peripheral edema.  Pulmonary: Symmetric Chest rise. Normal respiratory effort.   Abdomen: Soft to touch, non-tender.  Ext: No cyanosis, edema, or deformity  Skin: No rash. Normal palpation of skin.   Musculoskeletal: Normal digits and  nails by inspection. No clubbing.   Neurologic Examination  Mental status/Cognition: Alert, oriented to self, place, month and year, good attention.  Speech/language: Fluent, comprehension intact, object naming intact, repetition intact.  Cranial nerves:   CN II Pupils equal and reactive to light, Left hemianopsia.   CN III,IV,VI EOM intact, no gaze preference or deviation, no nystagmus    CN V normal sensation in V1, V2, and V3 segments bilaterally    CN VII no asymmetry, no nasolabial fold flattening    CN VIII normal hearing to speech    CN IX & X normal palatal elevation, no uvular deviation    CN XI 5/5 head turn and 5/5 shoulder shrug bilaterally    CN XII midline tongue protrusion    Motor:  Muscle bulk: normal, tone normal, pronator drift none tremor none Mvmt Root Nerve  Muscle Right Left Comments  SA C5/6 Ax Deltoid 5 5   EF C5/6 Mc Biceps 5 5   EE C6/7/8 Rad Triceps 5 5   WF C6/7 Med FCR     WE C7/8 PIN ECU     F Ab C8/T1 U ADM/FDI 5 5   HF L1/2/3 Fem Illopsoas 5 5   KE L2/3/4 Fem Quad 5 5   DF L4/5 D Peron Tib Ant 5 5   PF S1/2 Tibial Grc/Sol 5 5    Reflexes:  Right Left Comments  Pectoralis      Biceps (C5/6) 2 2   Brachioradialis (C5/6) 2 2    Triceps (C6/7) 2 2    Patellar (L3/4) 2 2    Achilles (S1)      Hoffman      Plantar     Jaw jerk    Sensation:  Light touch Intact throughout   Pin prick    Temperature    Vibration   Proprioception    Coordination/Complex Motor:  - Finger to Nose intact BL - Heel to shin intact BL - Rapid alternating movement are normal - Gait: unsafe to assess, still feels dizzy after sedation for MRI  Labs   CBC:  Recent Labs  Lab 03/02/21 1616 03/04/21 1223  WBC 7.6 8.7  NEUTROABS 6.1 6.7  HGB 14.8 16.3  HCT 43.6 49.1  MCV 87.4 89.3  PLT 212 238     Basic Metabolic Panel:  Lab Results  Component Value Date   NA 136 03/04/2021   K 4.3 03/04/2021   CO2 13 (L) 03/04/2021   GLUCOSE 200 (H) 03/04/2021   BUN 35 (H) 03/04/2021   CREATININE 1.81 (H) 03/04/2021   CALCIUM 9.9 03/04/2021   GFRNONAA 41 (L) 03/04/2021   GFRAA 56 (L) 01/25/2018   Lipid Panel:  Lab Results  Component Value Date   LDLCALC 77 03/04/2021   HgbA1c: No results found for: HGBA1C Urine Drug Screen: No results found for: LABOPIA, COCAINSCRNUR, LABBENZ, AMPHETMU, THCU, LABBARB  Alcohol Level No results found for: ETH  CT Head without contrast(Personally reviewed): CTH was negative for a large hypodensity concerning for a large territory infarct or hyperdensity concerning for an ICH  CT angio Head and Neck with contrast(Personally reviewed): No LVO  MRI Brain(Personally reviewed): R medial occipital stroke Impression   SPARROW SANZO is a 65 y.o. male with PMH significant for GERD, CKD 3, HTN, DM2, HLD, history of migraine headaches who presents with hemianopsia x 3 days. Found to have a R medial occipital stroke. His neurologic examination is notable for L hemianopsia. He is outside window for  intervention. I suspect that this is probably an embolic stroke.  Primary Diagnosis:  Cerebral infarction due to embolism of  right posterior cerebral artery.    Secondary Diagnosis: Essential (primary) hypertension, Type 2 diabetes mellitus w/o complications, and CKD Stage 3 (GFR 30-59)  Recommendations  Plan:  - Frequent Neuro checks per stroke unit protocol - Recommend obtaining TTE with bubble study - Recommend obtaining Lipid panel with LDL - Please start statin if LDL > 70 - Recommend HbA1c - Antithrombotic - Aspirin 81mg  daily. - Recommend DVT ppx - SBP goal - permissive hypertension first 24 h < 220/110. Held home meds.  - Recommend Telemetry monitoring for arrythmia - Recommend bedside swallow screen prior to PO intake. - Stroke education  booklet - Recommend PT/OT/SLP consult  _________________________________________________________________  Thank you for the opportunity to take part in the care of this patient. If you have any further questions, please contact the neurology consultation attending.  Signed,  [place] Pager Number [phone] _ _ _   _ __   _ __ _ _  __ __   _ __   __ _

## 2021-03-04 NOTE — Plan of Care (Signed)
Brief neurology note  65 yo patient presented with new L homonymous hemianopsia x3 days. Discharged from ED yesterday for same after being unable to tolerate MRI or stroke w/u. He returned to ED today w/ persistent L homonymous hemianopsia along with new symptom of hallucinations in the visual field areas which he could no longer see. MRI brain wo contrast reattempted today with more sedation which he tolerated. MRI showed diffusion restriction in medial R occipital lobe (2.8cm) c/w acute/subacute infarct.  Interim recommendations:  # Acute ischemic stroke R occipital lobe # L homonymous hemianopsia - Admit to hospitalist service for stroke w/u; stroke team will consult - Permissive HTN x48 hrs from sx onset goal BP <220/110. PRN labetalol or hydralazine if BP above these parameters. Avoid oral antihypertensives. - No need for further vascular imaging (CTA H&N performed 11/24 showed no LVO or hemodynamically-significant stenoses) - TTE - Check A1c and LDL + add statin per guidelines - ASA 81mg  daily + plavix 75mg  daily x21 days f/b ASA 81mg  daily monotherapy after that - q4 hr neuro checks - STAT head CT for any change in neuro exam - Tele - PT/OT/SLP - Stroke education - Amb referral to neurology upon discharge   # Sherran Needs syndrome - his hallucinations are an uncommon but well documented potential reaction to loss of vision. They should not be treated with antipsychotics or considered a mental health phenomenon. They may or may not improve over time.  Neurology will perform full consult this evening. Relevant orders for stroke w/u have been placed.  Su Monks, MD [facility] [phone]  If 7pm- 7am, please page neurology on call as listed in [place].

## 2021-03-04 NOTE — ED Notes (Signed)
Pt given ice water per [place], [place].

## 2021-03-05 ENCOUNTER — Observation Stay (HOSPITAL_COMMUNITY): Payer: Medicare Other

## 2021-03-05 ENCOUNTER — Inpatient Hospital Stay (HOSPITAL_COMMUNITY): Payer: Medicare Other

## 2021-03-05 DIAGNOSIS — E1165 Type 2 diabetes mellitus with hyperglycemia: Secondary | ICD-10-CM | POA: Diagnosis present

## 2021-03-05 DIAGNOSIS — R441 Visual hallucinations: Secondary | ICD-10-CM

## 2021-03-05 DIAGNOSIS — G9341 Metabolic encephalopathy: Secondary | ICD-10-CM | POA: Diagnosis present

## 2021-03-05 DIAGNOSIS — E1122 Type 2 diabetes mellitus with diabetic chronic kidney disease: Secondary | ICD-10-CM | POA: Diagnosis present

## 2021-03-05 DIAGNOSIS — N1831 Chronic kidney disease, stage 3a: Secondary | ICD-10-CM | POA: Diagnosis present

## 2021-03-05 DIAGNOSIS — I639 Cerebral infarction, unspecified: Secondary | ICD-10-CM

## 2021-03-05 DIAGNOSIS — G40909 Epilepsy, unspecified, not intractable, without status epilepticus: Secondary | ICD-10-CM | POA: Diagnosis present

## 2021-03-05 DIAGNOSIS — R519 Headache, unspecified: Secondary | ICD-10-CM | POA: Diagnosis present

## 2021-03-05 DIAGNOSIS — Z85828 Personal history of other malignant neoplasm of skin: Secondary | ICD-10-CM | POA: Diagnosis not present

## 2021-03-05 DIAGNOSIS — Z823 Family history of stroke: Secondary | ICD-10-CM | POA: Diagnosis not present

## 2021-03-05 DIAGNOSIS — F05 Delirium due to known physiological condition: Secondary | ICD-10-CM | POA: Diagnosis present

## 2021-03-05 DIAGNOSIS — R569 Unspecified convulsions: Secondary | ICD-10-CM | POA: Diagnosis not present

## 2021-03-05 DIAGNOSIS — I129 Hypertensive chronic kidney disease with stage 1 through stage 4 chronic kidney disease, or unspecified chronic kidney disease: Secondary | ICD-10-CM | POA: Diagnosis present

## 2021-03-05 DIAGNOSIS — Z87891 Personal history of nicotine dependence: Secondary | ICD-10-CM | POA: Diagnosis not present

## 2021-03-05 DIAGNOSIS — E11649 Type 2 diabetes mellitus with hypoglycemia without coma: Secondary | ICD-10-CM | POA: Diagnosis not present

## 2021-03-05 DIAGNOSIS — Z8669 Personal history of other diseases of the nervous system and sense organs: Secondary | ICD-10-CM

## 2021-03-05 DIAGNOSIS — F064 Anxiety disorder due to known physiological condition: Secondary | ICD-10-CM | POA: Diagnosis present

## 2021-03-05 DIAGNOSIS — Z20822 Contact with and (suspected) exposure to covid-19: Secondary | ICD-10-CM | POA: Diagnosis present

## 2021-03-05 DIAGNOSIS — E871 Hypo-osmolality and hyponatremia: Secondary | ICD-10-CM | POA: Diagnosis present

## 2021-03-05 DIAGNOSIS — Z8546 Personal history of malignant neoplasm of prostate: Secondary | ICD-10-CM | POA: Diagnosis not present

## 2021-03-05 DIAGNOSIS — K769 Liver disease, unspecified: Secondary | ICD-10-CM | POA: Diagnosis present

## 2021-03-05 DIAGNOSIS — I6389 Other cerebral infarction: Secondary | ICD-10-CM | POA: Diagnosis not present

## 2021-03-05 DIAGNOSIS — E872 Acidosis, unspecified: Secondary | ICD-10-CM | POA: Diagnosis present

## 2021-03-05 DIAGNOSIS — H53462 Homonymous bilateral field defects, left side: Secondary | ICD-10-CM | POA: Diagnosis present

## 2021-03-05 DIAGNOSIS — H5462 Unqualified visual loss, left eye, normal vision right eye: Secondary | ICD-10-CM | POA: Diagnosis present

## 2021-03-05 DIAGNOSIS — G40919 Epilepsy, unspecified, intractable, without status epilepticus: Secondary | ICD-10-CM | POA: Diagnosis not present

## 2021-03-05 DIAGNOSIS — E785 Hyperlipidemia, unspecified: Secondary | ICD-10-CM | POA: Diagnosis present

## 2021-03-05 DIAGNOSIS — N135 Crossing vessel and stricture of ureter without hydronephrosis: Secondary | ICD-10-CM | POA: Diagnosis present

## 2021-03-05 DIAGNOSIS — Z9049 Acquired absence of other specified parts of digestive tract: Secondary | ICD-10-CM | POA: Diagnosis not present

## 2021-03-05 DIAGNOSIS — G43909 Migraine, unspecified, not intractable, without status migrainosus: Secondary | ICD-10-CM | POA: Diagnosis present

## 2021-03-05 DIAGNOSIS — K219 Gastro-esophageal reflux disease without esophagitis: Secondary | ICD-10-CM | POA: Diagnosis present

## 2021-03-05 DIAGNOSIS — E1169 Type 2 diabetes mellitus with other specified complication: Secondary | ICD-10-CM | POA: Diagnosis present

## 2021-03-05 LAB — RAPID URINE DRUG SCREEN, HOSP PERFORMED
Amphetamines: NOT DETECTED
Barbiturates: NOT DETECTED
Benzodiazepines: NOT DETECTED
Cocaine: NOT DETECTED
Opiates: NOT DETECTED
Tetrahydrocannabinol: NOT DETECTED

## 2021-03-05 LAB — BASIC METABOLIC PANEL
Anion gap: 14 (ref 5–15)
BUN: 34 mg/dL — ABNORMAL HIGH (ref 8–23)
CO2: 14 mmol/L — ABNORMAL LOW (ref 22–32)
Calcium: 9 mg/dL (ref 8.9–10.3)
Chloride: 103 mmol/L (ref 98–111)
Creatinine, Ser: 1.81 mg/dL — ABNORMAL HIGH (ref 0.61–1.24)
GFR, Estimated: 41 mL/min — ABNORMAL LOW (ref >60)
Glucose, Bld: 213 mg/dL — ABNORMAL HIGH (ref 70–99)
Potassium: 4.1 mmol/L (ref 3.5–5.1)
Sodium: 131 mmol/L — ABNORMAL LOW (ref 135–145)

## 2021-03-05 LAB — GLUCOSE, CAPILLARY
Glucose-Capillary: 179 mg/dL — ABNORMAL HIGH (ref 70–99)
Glucose-Capillary: 133 mg/dL — ABNORMAL HIGH (ref 70–99)

## 2021-03-05 LAB — HIV ANTIBODY (ROUTINE TESTING W REFLEX): HIV Screen 4th Generation wRfx: NONREACTIVE

## 2021-03-05 LAB — CBC
HCT: 41.4 % (ref 39.0–52.0)
Hemoglobin: 14.3 g/dL (ref 13.0–17.0)
MCH: 29.3 pg (ref 26.0–34.0)
MCHC: 34.5 g/dL (ref 30.0–36.0)
MCV: 84.8 fL (ref 80.0–100.0)
Platelets: 221 10*3/uL (ref 150–400)
RBC: 4.88 MIL/uL (ref 4.22–5.81)
RDW: 12.8 % (ref 11.5–15.5)
WBC: 6.9 10*3/uL (ref 4.0–10.5)
nRBC: 0 % (ref 0.0–0.2)

## 2021-03-05 LAB — ECHOCARDIOGRAM COMPLETE BUBBLE STUDY
Area-P 1/2: 3.74 cm2
P 1/2 time: 611 msec
S' Lateral: 2.5 cm

## 2021-03-05 LAB — PHENYTOIN LEVEL, TOTAL
Phenytoin Lvl: >100 ug/mL (ref 10.0–20.0)
Phenytoin Lvl: 17.7 ug/mL (ref 10.0–20.0)

## 2021-03-05 MED ORDER — VITAMIN B-12 100 MCG PO TABS
500.0000 ug | ORAL_TABLET | Freq: Every day | ORAL | Status: DC
  Administered 2021-03-05 – 2021-03-13 (×9): 500 ug via ORAL
  Filled 2021-03-05 (×8): qty 5

## 2021-03-05 MED ORDER — PHENYTOIN SODIUM 50 MG/ML IJ SOLN
100.0000 mg | Freq: Three times a day (TID) | INTRAMUSCULAR | Status: DC
  Administered 2021-03-05 – 2021-03-09 (×12): 100 mg via INTRAVENOUS
  Filled 2021-03-05 (×12): qty 2

## 2021-03-05 MED ORDER — ALPRAZOLAM 0.5 MG PO TABS
0.5000 mg | ORAL_TABLET | Freq: Three times a day (TID) | ORAL | Status: DC | PRN
  Administered 2021-03-05 (×2): 0.5 mg via ORAL
  Filled 2021-03-05 (×2): qty 1

## 2021-03-05 MED ORDER — LEVETIRACETAM 500 MG PO TABS: 500.0000 mg | ORAL_TABLET | Freq: Two times a day (BID) | ORAL | Status: DC

## 2021-03-05 MED ORDER — LEVETIRACETAM 750 MG PO TABS
1500.0000 mg | ORAL_TABLET | Freq: Once | ORAL | Status: AC
  Administered 2021-03-05: 14:00:00 1500 mg via ORAL
  Filled 2021-03-05: qty 2

## 2021-03-05 MED ORDER — CLONAZEPAM 0.5 MG PO TABS
1.0000 mg | ORAL_TABLET | Freq: Two times a day (BID) | ORAL | Status: DC
  Administered 2021-03-05 – 2021-03-06 (×2): 1 mg via ORAL
  Filled 2021-03-05 (×3): qty 2

## 2021-03-05 MED ORDER — SODIUM CHLORIDE 0.9 % IV SOLN
1500.0000 mg | Freq: Once | INTRAVENOUS | Status: AC
  Administered 2021-03-05: 18:00:00 1500 mg via INTRAVENOUS
  Filled 2021-03-05: qty 30

## 2021-03-05 MED ORDER — ATORVASTATIN CALCIUM 10 MG PO TABS
20.0000 mg | ORAL_TABLET | Freq: Every day | ORAL | Status: DC
  Administered 2021-03-05 – 2021-03-12 (×8): 20 mg via ORAL
  Filled 2021-03-05 (×9): qty 2

## 2021-03-05 NOTE — Plan of Care (Signed)
TEE and loop implant requested for Tuesday, 03/07/21, per Dr Phoebe Sharps instructions. Gay Filler paged today. Awaiting return call. Message sent to Barrington Ellison PA-C (EP) and Gay Filler. Order written for TEE.  Mikey Bussing PA-C [facility] Pager [phone] 03/05/2021, 1:00 PM

## 2021-03-05 NOTE — Evaluation (Signed)
Speech Language Pathology Evaluation Patient Details Name: [name] MRN: [id number] DOB: 1955/06/22 Today's Date: 03/05/2021 Time: 1010-1035 SLP Time Calculation (min) (ACUTE ONLY): 25 min  Problem List:  Patient Active Problem List   Diagnosis Date Noted   Occipital infarction ([place]) 03/04/2021   CKD (chronic kidney disease), stage III ([place]) 03/04/2021   Type 2 diabetes mellitus ([place]) 03/04/2021   Hyperlipidemia associated with type 2 diabetes mellitus ([place]) 03/04/2021   Malignant neoplasm of prostate ([place]) 04/30/2016   Incisional hernia, without obstruction or gangrene 12/07/2015   Incisional hernia with obstruction 12/07/2015   Past Medical History:  Past Medical History:  Diagnosis Date   CKD (chronic kidney disease), stage III ([facility])    GERD (gastroesophageal reflux disease)    Hiatal hernia    History of esophageal dilatation    05/ 2004  incompleted ring   Hyperlipidemia associated with type 2 diabetes mellitus ([place])    Hypertension associated with diabetes ([place])    Mohs defect of cheek 2011   rt side/ eye to mandible   Prostate cancer [facility]) urologist-  dr dahlstedt/  oncologist-  dr Tammi Klippel   Stage T1c, Gleason 7, PSA 5.9, vol 27cc   Skin cancer of face 2011   returned 2013-radiation tx    Type 2 diabetes mellitus ([place])    Past Surgical History:  Past Surgical History:  Procedure Laterality Date   COLONOSCOPY     CYSTOSCOPY N/A 06/29/2016   Procedure: CYSTOSCOPY FLEXIBLE;  Surgeon: Franchot Gallo, MD;  Location: [facility];  Service: Urology;  Laterality: N/A;  no seeds found in bladder   ESOPHAGOGASTRODUODENOSCOPY (EGD) WITH ESOPHAGEAL DILATION  08/2002   HEMORRHOID SURGERY     INCISIONAL HERNIA REPAIR N/A 12/07/2015   Procedure: LAPAROSCOPIC INCISIONAL HERNIA WITH MESH ;  Surgeon: Fanny Skates, MD;  Location: [place];  Service: General;  Laterality: N/A;   LAPAROSCOPIC CHOLECYSTECTOMY  2010   PROSTATE BIOPSY     RADIOACTIVE SEED IMPLANT N/A  06/29/2016   Procedure: RADIOACTIVE SEED IMPLANT/BRACHYTHERAPY IMPLANT;  Surgeon: Franchot Gallo, MD;  Location: [facility];  Service: Urology;  Laterality: N/A;  86 seeds implanted    SKIN GRAFT  2013,2011   from neck to face on right side/ skin cancer and tumor   HPI:  Pt is a 65 y.o. male who presented to the ED for persistent headache and change in vision described by neurology as "L homonymous hemianopsia along with new symptom of hallucinations in the visual field areas which he could no longer see".  MRI brain 11/26: subtle restricted diffusion within the medial right occipital lobe, spanning approximately 2.8 cm, compatible with acute infarction. Pt outside window for intervention. PMH: type 2 diabetes, HLD, CKD stage IIIa, prostate cancer.   Assessment / Plan / Recommendation Clinical Impression  Pt participated in speech/language/cognition evaluation with his wife present. Pt reported that he is retired and resides at home with his wife. Both parties agreed that the pt was independent with medication and financial management prior to admission. Pt's wife denied any acute change in speech, language, or cognition. Pt's wife reported that the pt has not slept in 2.5 days and pt expressed c/o fatigue. Pt was intermittently tearful and upset during episodes of visual hallucinations. Pt reported seeing lights and pt's wife was noted to place a washcloth over his eyes during these periods until they subsided. The impact of these factors on the pt's performance is considered. Pt's speech and language skills were WNL. The  [facility] Mental Status Examination was completed to evaluate the pt's cognitive-linguistic skills. Pt's score was adjusted since he could not complete some tasks due to his visual deficits. He achieved an adjusted score of 15/28 which is below the normal limits of 27 or more out of 30. He exhibited difficulty in the areas of memory, mental flexibility, and  executive function with need for additional processing time to improve accuracy. Pt's wife stated that she has been married to the pt for 48 years and that she knows that his presentation is only due to his being distracted by the visual hallucinations and not having slept. It was agreed that SLP services be deferred to the next level of care if cognitive changes persist despite improvement in the suspected contributing factors.     SLP Assessment  SLP Recommendation/Assessment: All further Speech Lanaguage Pathology  needs can be addressed in the next venue of care SLP Visit Diagnosis: Cognitive communication deficit (R41.841)    Recommendations for follow up therapy are one component of a multi-disciplinary discharge planning process, led by the attending physician.  Recommendations may be updated based on patient status, additional functional criteria and insurance authorization.    Follow Up Recommendations   (Continues SLP services at level of care recommended by PT/OT)    Assistance Recommended at Discharge  None  Functional Status Assessment    Frequency and Duration           SLP Evaluation Cognition  Overall Cognitive Status: Impaired/Different from baseline Arousal/Alertness: Awake/alert Orientation Level: Oriented X4 Year: 2022 Month: November Day of Week: Correct Attention: Focused;Sustained Focused Attention: Appears intact Sustained Attention: Appears intact Memory: Impaired Memory Impairment: Retrieval deficit;Decreased recall of new information (Immediate: 5/5 with repetition delayed: 4/5; with cues:1/1) Awareness: Appears intact Executive Function: Sequencing;Organizing Sequencing: Impaired Sequencing Impairment: Verbal complex (Clock drawing: 0/4) Organizing: Impaired Organizing Impairment: Verbal complex (Backward digit span: 1/2)       Comprehension  Auditory Comprehension Overall Auditory Comprehension: Appears within functional limits for tasks  assessed Yes/No Questions: Within Functional Limits Commands: Within Functional Limits Conversation: Complex    Expression Expression Primary Mode of Expression: Verbal Verbal Expression Overall Verbal Expression: Appears within functional limits for tasks assessed Initiation: No impairment Level of Generative/Spontaneous Verbalization: Conversation Repetition: No impairment Naming: No impairment Pragmatics: No impairment   Oral / Motor  Oral Motor/Sensory Function Overall Oral Motor/Sensory Function: Within functional limits Motor Speech Overall Motor Speech: Appears within functional limits for tasks assessed Respiration: Within functional limits Phonation: Normal Resonance: Within functional limits Articulation: Within functional limitis Intelligibility: Intelligible Motor Speech Errors: Not applicable   Fallen Crisostomo I. Hardin Negus, [place], [place] Office number [phone] Pager [place] 03/05/2021, 11:05 AM

## 2021-03-05 NOTE — Progress Notes (Signed)
LTM was setup at bedside with wife present and Dr. Erlinda Hong talking with her over phone about the test. Patient is receiving seizure meds now. No skin breakdown. CT EEG leads used only. Impedances= good. Dr. Erlinda Hong states wrist restraints can be used, but wife wishes not. No order for them was placed. Results pending.

## 2021-03-05 NOTE — Progress Notes (Signed)
PT Cancellation Note  Patient Details Name: [name] MRN: [id number] DOB: [birth year]   Cancelled Treatment:    Reason Eval/Treat Not Completed: Patient at procedure or test/unavailable per chart, doppler clear/negative for DVT- but now leaving the unit for further imaging/procedure. At this point will check back on next date of service.   Windell Norfolk, DPT, PN2   Supplemental Physical Therapist [place]    Pager [phone] Acute Rehab Office [phone]

## 2021-03-05 NOTE — Progress Notes (Signed)
[facility] PROGRESS NOTE   [name] FKC:[id number] DOB: [birth year] DOA: 03/04/2021  PCP: [name], DO  Brief History/Interval Summary: 65 y.o. male with medical history significant for type 2 diabetes, HLD, CKD stage IIIa, prostate cancer who presented to the ED for persistent headache and change in vision.  Initially presented on 11/24.  MRI brain was limited evaluation due to patient's intolerance.  Patient was discharged home.  Came back with persistent visual deficits.  Repeat MRI showed an acute stroke in the occipital lobe.  Patient was hospitalized for further management.    Reason for Visit: Acute stroke  Consultants: Neurology  Procedures: Transthoracic echocardiogram is pending    Subjective/Interval History: Patient continues to have significant visual disturbances involving his left eye.  He seeing flashing lights.  Extremely anxious.  No nausea vomiting.  His wife is at the bedside    Assessment/Plan:  Acute stroke Involving right occipital lobe.  Significant left homonymous hemianopsia.  Seeing flashing lights.  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  Patient underwent CT angiogram head and neck as well.  Also underwent CT venogram of head. Patient currently on aspirin 81 mg daily and Plavix intrafamily grams daily.  Noted to be on statin.  LDL is 77.  HbA1c is pending.  Urine drug screen was unremarkable. PT and OT evaluation.  Further management per stroke team.  Anxiety Due to his acute visual disturbances.  We will give him Xanax as needed.  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  SSI.  Noted to be on metformin and Jardiance prior to admission.  Chronic kidney disease stage IIIa/metabolic acidosis Recent baseline is unknown.  Noted to be worse compared to 2019.  Monitor renal function closely.  Avoid nephrotoxic agents.  Holding ACE inhibitor.  Monitor urine output. Bicarbonate  levels noted to be low.  Anion gap is normal.  Likely due to his chronic kidney disease.  We will recheck tomorrow.  May need to initiate sodium bicarbonate.  Essential hypertension/hyponatremia Holding ACE inhibitor.  Allowing some degree of permissive hypertension. Recheck sodium levels tomorrow.  Hyperlipidemia Continue statin.    DVT Prophylaxis: Subcutaneous heparin Code Status: Full code Family Communication: Discussed with patient's wife Disposition Plan: Hopefully return home when improved.  Status is: Observation  Unclear if the patient will stay tonight or not.     Medications: Scheduled:  aspirin  81 mg Oral Daily   atorvastatin  20 mg Oral QHS   clopidogrel  75 mg Oral Daily   heparin  5,000 Units Subcutaneous Q8H   insulin aspart  0-9 Units Subcutaneous TID WC   pantoprazole  40 mg Oral q morning   Continuous: VCB:SWHQPRFFMBWGY **OR** acetaminophen (TYLENOL) oral liquid 160 mg/5 mL **OR** acetaminophen, labetalol, ondansetron (ZOFRAN) IV, senna-docusate  Antibiotics: Anti-infectives (From admission, onward)    None       Objective:  Vital Signs  Vitals:   03/05/21 0133 03/05/21 0333 03/05/21 0533 03/05/21 0736  BP: 129/83 129/84 130/86 (!) 142/73  Pulse: (!) 104 98 88 78  Resp: 20 20 20 19   Temp: 98.2 F (36.8 C) 98.4 F (36.9 C) 98.5 F (36.9 C) 98.4 F (36.9 C)  TempSrc: Oral Oral Oral Oral  SpO2: 96% 98% 99% 98%  Weight:      Height:        Intake/Output Summary (Last 24 hours) at 03/05/2021 1003 Last data filed at 03/04/2021 2000 Gross per 24 hour  Intake 1000  ml  Output --  Net 1000 ml   Filed Weights   03/04/21 2040  Weight: 86.2 kg    General appearance: Awake alert.  In no distress Resp: Clear to auscultation bilaterally.  Normal effort Cardio: S1-S2 is normal regular.  No S3-S4.  No rubs murmurs or bruit GI: Abdomen is soft.  Nontender nondistended.  Bowel sounds are present normal.  No masses organomegaly Extremities:  No edema.  Full range of motion of lower extremities. Neurologic: Alert and oriented x3.  No focal motor neurological deficits.    Lab Results:  Data Reviewed: I have personally reviewed following labs and imaging studies  CBC: Recent Labs  Lab 03/02/21 1616 03/04/21 1223 03/05/21 0216  WBC 7.6 8.7 6.9  NEUTROABS 6.1 6.7  --   HGB 14.8 16.3 14.3  HCT 43.6 49.1 41.4  MCV 87.4 89.3 84.8  PLT 212 238 270    Basic Metabolic Panel: Recent Labs  Lab 03/02/21 1616 03/04/21 1223 03/05/21 0216  NA 132* 136 131*  K 4.8 4.3 4.1  CL 99 105 103  CO2 20* 13* 14*  GLUCOSE 371* 200* 213*  BUN 37* 35* 34*  CREATININE 2.05* 1.81* 1.81*  CALCIUM 9.7 9.9 9.0    GFR: Estimated Creatinine Clearance: 43.5 mL/min (A) (by C-G formula based on SCr of 1.81 mg/dL (H)).  Liver Function Tests: Recent Labs  Lab 03/02/21 1616  AST 12*  ALT 21  ALKPHOS 86  BILITOT 0.9  PROT 7.4  ALBUMIN 4.6    Recent Labs  Lab 03/02/21 1616  LIPASE 37    Coagulation Profile: Recent Labs  Lab 03/04/21 1223  INR 0.9     CBG: Recent Labs  Lab 03/02/21 1524 03/04/21 2349 03/05/21 0620  GLUCAP 379* 164* 179*    Lipid Profile: Recent Labs    03/04/21 1322  CHOL 152  HDL 39*  LDLCALC 77  TRIG 181*  CHOLHDL 3.9     Recent Results (from the past 240 hour(s))  Resp Panel by RT-PCR (Flu A&B, Covid) Nasopharyngeal Swab     Status: None   Collection Time: 03/02/21  8:57 PM   Specimen: Nasopharyngeal Swab; Nasopharyngeal(NP) swabs in vial transport medium  Result Value Ref Range Status   SARS Coronavirus 2 by RT PCR NEGATIVE NEGATIVE Final    Comment: (NOTE) SARS-CoV-2 target nucleic acids are NOT DETECTED.  The SARS-CoV-2 RNA is generally detectable in upper respiratory specimens during the acute phase of infection. The lowest concentration of SARS-CoV-2 viral copies this assay can detect is 138 copies/mL. A negative result does not preclude SARS-Cov-2 infection and should not  be used as the sole basis for treatment or other patient management decisions. A negative result may occur with  improper specimen collection/handling, submission of specimen other than nasopharyngeal swab, presence of viral mutation(s) within the areas targeted by this assay, and inadequate number of viral copies(<138 copies/mL). A negative result must be combined with clinical observations, patient history, and epidemiological information. The expected result is Negative.  Fact Sheet for Patients:  EntrepreneurPulse.com.au  Fact Sheet for Healthcare Providers:  IncredibleEmployment.be  This test is no t yet approved or cleared by the [place] FDA and  has been authorized for detection and/or diagnosis of SARS-CoV-2 by FDA under an Emergency Use Authorization (EUA). This EUA will remain  in effect (meaning this test can be used) for the duration of the COVID-19 declaration under Section 564(b)(1) of the Act, 21 U.S.C.section 360bbb-3(b)(1), unless the authorization is terminated  or revoked sooner.       Influenza A by PCR NEGATIVE NEGATIVE Final   Influenza B by PCR NEGATIVE NEGATIVE Final    Comment: (NOTE) The Xpert Xpress SARS-CoV-2/FLU/RSV plus assay is intended as an aid in the diagnosis of influenza from Nasopharyngeal swab specimens and should not be used as a sole basis for treatment. Nasal washings and aspirates are unacceptable for Xpert Xpress SARS-CoV-2/FLU/RSV testing.  Fact Sheet for Patients: EntrepreneurPulse.com.au  Fact Sheet for Healthcare Providers: IncredibleEmployment.be  This test is not yet approved or cleared by the [place] FDA and has been authorized for detection and/or diagnosis of SARS-CoV-2 by FDA under an Emergency Use Authorization (EUA). This EUA will remain in effect (meaning this test can be used) for the duration of the COVID-19 declaration under Section  564(b)(1) of the Act, 21 U.S.C. section 360bbb-3(b)(1), unless the authorization is terminated or revoked.  Performed at [facility], [address] [place], [place] [address]       Radiology Studies: MR BRAIN WO CONTRAST  Result Date: 03/04/2021 CLINICAL DATA:  Neuro deficit, acute, stroke suspected. EXAM: MRI HEAD WITHOUT CONTRAST TECHNIQUE: Multiplanar, multiecho pulse sequences of the brain and surrounding structures were obtained without intravenous contrast. COMPARISON:  Noncontrast head CT 03/02/2021. Brain MRI 03/02/2021. CT angiogram head/neck 03/03/2021. CT venogram head 03/03/2021. FINDINGS: Brain: Intermittently motion degraded examination, limiting evaluation. Most notably, there is moderate/severe motion degradation of the sagittal T1 weighted sequence, moderate motion degradation of the axial T2 FLAIR sequence, moderate/severe motion degradation of the axial SWI sequence and severe motion degradation of the coronal T2 TSE sequence. Cerebral volume is normal for age. New from the prior brain MRI of 03/02/2021, there is subtle cortically-based restricted diffusion within the medial right occipital lobe spanning approximately 2.8 cm (for instance as seen on series 4, image 10). Findings are compatible with acute infarction. Minimal multifocal T2 FLAIR hyperintense signal abnormality within the cerebral white matter, nonspecific but compatible with chronic small vessel ischemic disease. 3 mm round T2 hyperintense focus within the midline pons, which may reflect a prominent perivascular space or chronic lacunar infarct (series 6, image 6). No evidence of an intracranial mass. No appreciable intracranial hemorrhage. No extra-axial fluid collection. No midline shift. Vascular: Maintained flow voids within the proximal large arterial vessels. Skull and upper cervical spine: No focal suspicious marrow lesion is identified. Sinuses/Orbits: Visualized orbits show no acute finding. No significant  paranasal sinus disease. Other: Bilateral mastoid effusions. IMPRESSION: Motion degraded examination, as described. New from the prior brain MRI of 03/02/2021, there is subtle restricted diffusion within the medial right occipital lobe, spanning approximately 2.8 cm, compatible with acute infarction (right PCA vascular territory). Minimal chronic small-vessel ischemic changes within the cerebral white matter. 3 mm prominent perivascular space versus chronic lacunar infarct within the midline pons. Bilateral mastoid effusions. Electronically Signed   By: Kellie Simmering D.O.   On: 03/04/2021 17:14       LOS: 0 days   [facility] Pager on [email or web address]  03/05/2021, 10:03 AM

## 2021-03-05 NOTE — Progress Notes (Signed)
STROKE TEAM PROGRESS NOTE   INTERVAL HISTORY His wife is at the bedside.  Pt lying in bed, emotional and exhausted. He reported frequent visual hallucinations of bright lights and people coming at him at his left visual field. However, he does have left hemianopia. He has to use washcloth to cover and press on his eyes to overcome the emotional changes that associated with the episodes. The episodes happens every 5-34min and lasts about 10-20 sec and he becomes emotional, fearful and crying during the episodes.   Per wife, pt has hx of migraine and it happens every 3-4 months with bad HA, he has to go to dark room for those. He also has visual aura with the migraine but more of color dots and wiggly lines. Pt had similar migraine attack one week ago and again last Wednesday but seems more severe than before. Thursday, pt woke up in the middle of night, complaining for eye pain with bright lights but later subsided so he did not come to hospital. Later that day, the visual changes and HA behind eyes became unbearable that prompted him to come to ER.  Found to have left hemianopia, but CT, CTA, CTV and MRI were negative, he was discharged from ER after migraine cocktail. However, symptoms did not improve but more visual hallucination with emotional change, he was sent back to ER.   OBJECTIVE Vitals:   03/05/21 0133 03/05/21 0333 03/05/21 0533 03/05/21 0736  BP: 129/83 129/84 130/86 (!) 142/73  Pulse: (!) 104 98 88 78  Resp: 20 20 20 19   Temp: 98.2 F (36.8 C) 98.4 F (36.9 C) 98.5 F (36.9 C) 98.4 F (36.9 C)  TempSrc: Oral Oral Oral Oral  SpO2: 96% 98% 99% 98%  Weight:      Height:        CBC:  Recent Labs  Lab 03/02/21 1616 03/04/21 1223 03/05/21 0216  WBC 7.6 8.7 6.9  NEUTROABS 6.1 6.7  --   HGB 14.8 16.3 14.3  HCT 43.6 49.1 41.4  MCV 87.4 89.3 84.8  PLT 212 238 270    Basic Metabolic Panel:  Recent Labs  Lab 03/04/21 1223 03/05/21 0216  NA 136 131*  K 4.3 4.1  CL 105  103  CO2 13* 14*  GLUCOSE 200* 213*  BUN 35* 34*  CREATININE 1.81* 1.81*  CALCIUM 9.9 9.0    Lipid Panel:     Component Value Date/Time   CHOL 152 03/04/2021 1322   TRIG 181 (H) 03/04/2021 1322   HDL 39 (L) 03/04/2021 1322   CHOLHDL 3.9 03/04/2021 1322   VLDL 36 03/04/2021 1322   LDLCALC 77 03/04/2021 1322   HgbA1c: No results found for: HGBA1C Urine Drug Screen:     Component Value Date/Time   LABOPIA NONE DETECTED 03/05/2021 0600   COCAINSCRNUR NONE DETECTED 03/05/2021 0600   LABBENZ NONE DETECTED 03/05/2021 0600   AMPHETMU NONE DETECTED 03/05/2021 0600   THCU NONE DETECTED 03/05/2021 0600   LABBARB NONE DETECTED 03/05/2021 0600    Alcohol Level No results found for: ETH  IMAGING  MR BRAIN WO CONTRAST  Result Date: 03/04/2021 CLINICAL DATA:  Neuro deficit, acute, stroke suspected. EXAM: MRI HEAD WITHOUT CONTRAST TECHNIQUE: Multiplanar, multiecho pulse sequences of the brain and surrounding structures were obtained without intravenous contrast. COMPARISON:  Noncontrast head CT 03/02/2021. Brain MRI 03/02/2021. CT angiogram head/neck 03/03/2021. CT venogram head 03/03/2021. FINDINGS: Brain: Intermittently motion degraded examination, limiting evaluation. Most notably, there is moderate/severe motion degradation of the sagittal  T1 weighted sequence, moderate motion degradation of the axial T2 FLAIR sequence, moderate/severe motion degradation of the axial SWI sequence and severe motion degradation of the coronal T2 TSE sequence. Cerebral volume is normal for age. New from the prior brain MRI of 03/02/2021, there is subtle cortically-based restricted diffusion within the medial right occipital lobe spanning approximately 2.8 cm (for instance as seen on series 4, image 10). Findings are compatible with acute infarction. Minimal multifocal T2 FLAIR hyperintense signal abnormality within the cerebral white matter, nonspecific but compatible with chronic small vessel ischemic disease. 3  mm round T2 hyperintense focus within the midline pons, which may reflect a prominent perivascular space or chronic lacunar infarct (series 6, image 6). No evidence of an intracranial mass. No appreciable intracranial hemorrhage. No extra-axial fluid collection. No midline shift. Vascular: Maintained flow voids within the proximal large arterial vessels. Skull and upper cervical spine: No focal suspicious marrow lesion is identified. Sinuses/Orbits: Visualized orbits show no acute finding. No significant paranasal sinus disease. Other: Bilateral mastoid effusions. IMPRESSION: Motion degraded examination, as described. New from the prior brain MRI of 03/02/2021, there is subtle restricted diffusion within the medial right occipital lobe, spanning approximately 2.8 cm, compatible with acute infarction (right PCA vascular territory). Minimal chronic small-vessel ischemic changes within the cerebral white matter. 3 mm prominent perivascular space versus chronic lacunar infarct within the midline pons. Bilateral mastoid effusions. Electronically Signed   By: Kellie Simmering D.O.   On: 03/04/2021 17:14     ECG - SR rate 80 BPM. (See cardiology reading for complete details)   PHYSICAL EXAM  Temp:  [98 F (36.7 C)-98.5 F (36.9 C)] 98.4 F (36.9 C) (11/27 0736) Pulse Rate:  [62-108] 78 (11/27 0736) Resp:  [14-20] 19 (11/27 0736) BP: (126-156)/(73-86) 142/73 (11/27 0736) SpO2:  [95 %-99 %] 98 % (11/27 0736) Weight:  [86.2 kg] 86.2 kg (11/26 2040)  General - Well nourished, well developed, emotional especially with the visual hallucination episodes.  Ophthalmologic - fundi not visualized due to noncooperation.  Cardiovascular - Regular rhythm and rate.  Mental Status -  Level of arousal and orientation to time, place, and person were intact. Language including expression, naming, repetition, comprehension was assessed and found intact.  Cranial Nerves II - XII - II - left hemianopia III, IV, VI -  Extraocular movements intact. V - Facial sensation intact bilaterally. VII - right nasolabial fold flattening, ? Related to poor denture VIII - Hearing & vestibular intact bilaterally. X - Palate elevates symmetrically. XI - Chin turning & shoulder shrug intact bilaterally. XII - Tongue protrusion intact.  Motor Strength - The patient's strength was normal in all extremities and pronator drift was absent.  Bulk was normal and fasciculations were absent.   Motor Tone - Muscle tone was assessed at the neck and appendages and was normal.  Reflexes - The patient's reflexes were symmetrical in all extremities and he had no pathological reflexes.  Sensory - Light touch, temperature/pinprick were assessed and were symmetrical.    Coordination - The patient had normal movements in the hands with no ataxia or dysmetria.  Tremor was absent.  Gait and Station - deferred.    ASSESSMENT/PLAN [name] is a 65 y.o. male with history of GERD, CKD 3, HTN, DM2, prostate cancer, HLD, migraine with visual aura who presented 03/02/21 with hemianopsia x 3 days. Initial limited MRI negative for stroke (pt unable to tolerate complete MRI) Migraine suspected. The pt returned 03/04/21 with continued hemianopsia.  Repeat MRI c/w Rt occipital stroke. Pt. not receive IV t-PA - outside window.   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 LE venous Doppler negative for DVT 2D Echo - pending Will consider TEE and loop recorder if work up neg UDS negative LDL - 77 HgbA1c - pending VTE prophylaxis - Mono Heparin No antithrombotic prior to admission, now on aspirin 81 mg daily and clopidogrel 75 mg  daily DAPT for 3 weeks and then aspirin. Patient will be counseled to be compliant with his antithrombotic medications Ongoing aggressive stroke risk factor management Therapy recommendations: pending Disposition:  Pending  Visual hallucinations Associated with emotional changes, fear and eye pain Occurs at left visual field EEG pending to rule out occipital seizure Will start Keppra (1.5g load followed with 500mg  bid) will consider repeat limited MRI once visual hallucination improves  History of migraine Normally happens every 3 to 4 months Frequent visual aura with colored dots and wiggly lines ASA helps  Hypertension Home BP meds: Altace 5 mg daily Current BP meds: normodyne prn Stable Long-term BP goal normotensive  Hyperlipidemia Home Lipid lowering medication: Lipitor 10 mg daily  LDL 77, goal < 70 Current lipid lowering medication: Lipitor 20 mg daily  Continue statin at discharge  Diabetes Home diabetic meds: Jardiance, metformin HgbA1c - pending, goal < 7.0 SSI  CBG monitoring Close PCP follow-up for better DM control  Other Stroke Risk Factors Advanced age Former cigarette smoker - quit 42 years ago Family hx stroke (brother)   Other Active Problems, Code status - Full code CKD - stage III - creatinine 1.81   Hospital day # 0  I had long discussion with pt and wife at bedside, updated pt current condition, treatment plan and potential prognosis, and answered all the questions. They expressed understanding and appreciation. I spent  35 minutes in total face-to-face time with the patient, more than 50% of which was spent in counseling and coordination of care, reviewing test results, images and medication, and discussing the diagnosis, treatment plan and potential prognosis. This patient's care requiresreview of multiple databases, neurological assessment, discussion with family, other specialists and medical decision making of high complexity.    Rosalin Hawking, MD PhD Stroke Neurology 03/05/2021 12:54 PM   To contact Stroke Continuity provider, please refer to [email or web address] After hours, contact General Neurology

## 2021-03-05 NOTE — Progress Notes (Signed)
EEG was done at bedside. Impedance=Good. No skin breakdown noted. Patient had sudden mental change with tech during recording. Results pending.

## 2021-03-05 NOTE — Progress Notes (Signed)
VASCULAR LAB    Bilateral lower extremity venous duplex has been performed.  See CV proc for preliminary results.   Tauni Sanks, RVT 03/05/2021, 12:05 PM

## 2021-03-05 NOTE — Progress Notes (Signed)
PT Cancellation Note  Patient Details Name: HOGAN HOOBLER MRN: [id number] DOB: November 28, 1955   Cancelled Treatment:    Reason Eval/Treat Not Completed: Other (comment) per chart, scheduled for doppler for DVT r/o today. Awaiting results of doppler before seeing for PT.   Ann Lions PT, DPT, PN2   Supplemental Physical Therapist [place]    Pager [phone] Acute Rehab Office [phone]

## 2021-03-05 NOTE — Procedures (Addendum)
Patient Name: [name]  MRN: [id number]  Epilepsy Attending: Lora Havens  Referring Physician/Provider: Dr Rosalin Hawking Date: 03/05/2021 Duration: 21.45 mins  Patient history: 65yo M with right PCA stroke and visual hallucinations. EEG to evaluate for seizure.   Level of alertness: Awake, asleep  AEDs during EEG study: LEV  Technical aspects: This EEG study was done with scalp electrodes positioned according to the 10-20 International system of electrode placement. Electrical activity was acquired at a sampling rate of 500Hz  and reviewed with a high frequency filter of 70Hz  and a low frequency filter of 1Hz . EEG data were recorded continuously and digitally stored.   Description: No clear posterior dominant rhythm consists of 8 Hz activity of moderate voltage (25-35 uV) seen predominantly in posterior head regions, symmetric and reactive to eye opening and eye closing. Sleep was characterized by vertex waves, sleep spindles (12 to 14 Hz), maximal frontocentral region.   Three seizure without clinical signs were noted on 03/05/2021 at 1431, 1436 and 1447 arising from right temporo-parietal region. Average duration 30 seconds. During seizure, eeg showed 5-6Hz  theta slowing in right temporo-parietal region which then involved all of right frontotemporal region as well as left frontal region and evolved into 3-4hz  delta slowing.  Hyperventilation and photic stimulation were not performed.     ABNORMALITY - Seizure without clinical signs, right temporo-parietal region.  IMPRESSION: This study showed three seizure without clinical signs on 03/05/2021 at 1431, 1436 and 1447 arising from right temporo-parietal region. Average duration 30 seconds.  Dr Erlinda Hong was notified.   Mayar Whittier [name]

## 2021-03-06 DIAGNOSIS — I639 Cerebral infarction, unspecified: Secondary | ICD-10-CM | POA: Diagnosis not present

## 2021-03-06 DIAGNOSIS — R569 Unspecified convulsions: Secondary | ICD-10-CM | POA: Diagnosis not present

## 2021-03-06 DIAGNOSIS — E1122 Type 2 diabetes mellitus with diabetic chronic kidney disease: Secondary | ICD-10-CM | POA: Diagnosis not present

## 2021-03-06 DIAGNOSIS — N1831 Chronic kidney disease, stage 3a: Secondary | ICD-10-CM | POA: Diagnosis not present

## 2021-03-06 DIAGNOSIS — E11649 Type 2 diabetes mellitus with hypoglycemia without coma: Secondary | ICD-10-CM | POA: Diagnosis not present

## 2021-03-06 LAB — GLUCOSE, CAPILLARY
Glucose-Capillary: 140 mg/dL — ABNORMAL HIGH (ref 70–99)
Glucose-Capillary: 114 mg/dL — ABNORMAL HIGH (ref 70–99)
Glucose-Capillary: 290 mg/dL — ABNORMAL HIGH (ref 70–99)
Glucose-Capillary: 233 mg/dL — ABNORMAL HIGH (ref 70–99)
Glucose-Capillary: 162 mg/dL — ABNORMAL HIGH (ref 70–99)
Glucose-Capillary: 292 mg/dL — ABNORMAL HIGH (ref 70–99)

## 2021-03-06 LAB — HEMOGLOBIN A1C
Hgb A1c MFr Bld: 13.5 % — ABNORMAL HIGH (ref 4.8–5.6)
Mean Plasma Glucose: 341 mg/dL

## 2021-03-06 LAB — BASIC METABOLIC PANEL
Anion gap: 11 (ref 5–15)
BUN: 29 mg/dL — ABNORMAL HIGH (ref 8–23)
CO2: 17 mmol/L — ABNORMAL LOW (ref 22–32)
Calcium: 9.5 mg/dL (ref 8.9–10.3)
Chloride: 108 mmol/L (ref 98–111)
Creatinine, Ser: 1.54 mg/dL — ABNORMAL HIGH (ref 0.61–1.24)
GFR, Estimated: 50 mL/min — ABNORMAL LOW (ref >60)
Glucose, Bld: 105 mg/dL — ABNORMAL HIGH (ref 70–99)
Potassium: 3.5 mmol/L (ref 3.5–5.1)
Sodium: 136 mmol/L (ref 135–145)

## 2021-03-06 LAB — CBC
HCT: 44.1 % (ref 39.0–52.0)
Hemoglobin: 15.1 g/dL (ref 13.0–17.0)
MCH: 28.9 pg (ref 26.0–34.0)
MCHC: 34.2 g/dL (ref 30.0–36.0)
MCV: 84.3 fL (ref 80.0–100.0)
Platelets: 219 10*3/uL (ref 150–400)
RBC: 5.23 MIL/uL (ref 4.22–5.81)
RDW: 12.7 % (ref 11.5–15.5)
WBC: 5.4 10*3/uL (ref 4.0–10.5)
nRBC: 0 % (ref 0.0–0.2)

## 2021-03-06 LAB — PHENYTOIN LEVEL, TOTAL: Phenytoin Lvl: 17 ug/mL (ref 10.0–20.0)

## 2021-03-06 MED ORDER — POTASSIUM CHLORIDE CRYS ER 20 MEQ PO TBCR
40.0000 meq | EXTENDED_RELEASE_TABLET | Freq: Once | ORAL | Status: AC
  Administered 2021-03-06: 10:00:00 40 meq via ORAL
  Filled 2021-03-06: qty 2

## 2021-03-06 MED ORDER — SODIUM CHLORIDE 0.9 % IV SOLN
250.0000 mg | Freq: Once | INTRAVENOUS | Status: AC
  Administered 2021-03-06: 17:00:00 250 mg via INTRAVENOUS
  Filled 2021-03-06: qty 5

## 2021-03-06 MED ORDER — LACOSAMIDE 200 MG/20ML IV SOLN
200.0000 mg | Freq: Two times a day (BID) | INTRAVENOUS | Status: DC
  Administered 2021-03-06 – 2021-03-09 (×7): 200 mg via INTRAVENOUS
  Filled 2021-03-06 (×8): qty 20

## 2021-03-06 NOTE — Procedures (Addendum)
Patient Name: [name]  MRN: [id number]  Epilepsy Attending: Lora Havens  Referring Physician/Provider: Dr Rosalin Hawking Duration: 03/05/2021 1746 to11/28/2022 1746   Patient history: 65yo M with right PCA stroke and visual hallucinations. EEG to evaluate for seizure.    Level of alertness: Awake, asleep   AEDs during EEG study: PHT, Clonazepam   Technical aspects: This EEG study was done with scalp electrodes positioned according to the 10-20 International system of electrode placement. Electrical activity was acquired at a sampling rate of 500Hz  and reviewed with a high frequency filter of 70Hz  and a low frequency filter of 1Hz . EEG data were recorded continuously and digitally stored.    Description: No clear posterior dominant rhythm consists of 8 Hz activity of moderate voltage (25-35 uV) seen predominantly in posterior head regions, symmetric and reactive to eye opening and eye closing. Sleep was characterized by vertex waves, sleep spindles (12 to 14 Hz), maximal frontocentral region.    22 seizure without clinical signs were noted during this study arising from right temporo-parietal region. Average duration 30 seconds. Last seizure was noted on 03/06/2021 at 1537. During seizure, eeg showed 5-6Hz  theta slowing in right temporo-parietal region which then involved all of right frontotemporal region as well as left frontal region and evolved into 3-4hz  delta slowing.  EEG also showed lateralized periodic discharges in right hemisphere, maximal right temporoparietal region at 1 Hz which at times appeared rhythmic consistent with brief ictal-interictal rhythmic discharges   Hyperventilation and photic stimulation were not performed.      ABNORMALITY - Seizure without clinical signs, right temporo-parietal region. -Brief ictal-interictal rhythmic discharges, right hemisphere, maximal right temporoparietal region  -Lateralized paretic discharges,right hemisphere, maximal right  temporoparietal region    IMPRESSION: This study showed 22 seizure without clinical signs arising nfrom right temporo-parietal region. Average duration 30 seconds. Last seizure was noted on 03/06/2021 at 1537.  Additionally, EEG showed evidence of cortical dysfunction and epileptogenicity arising from right hemisphere, maximal right temporoparietal region likely due to underlying structural abnormality with high potential for seizure recurrence.   [name] [name]

## 2021-03-06 NOTE — Care Plan (Signed)
Per Dr Erlinda Hong - will need to cancel TEE and loop for now due to seizure activity. May consider in future.  Mikey Bussing PA-C [facility] Pager [phone] 03/06/2021, 1:11 PM

## 2021-03-06 NOTE — Progress Notes (Incomplete)
STROKE TEAM PROGRESS NOTE   INTERVAL HISTORY ***  OBJECTIVE Vitals:   03/05/21 1956 03/05/21 2340 03/06/21 0400 03/06/21 0810  BP: 122/78 (!) 142/87 (!) 121/95 117/72  Pulse: 83 72 89 93  Resp: 16 15 14    Temp: 97.8 F (36.6 C) 97.8 F (36.6 C) 97.8 F (36.6 C) 98 F (36.7 C)  TempSrc: Oral Oral Oral   SpO2: 97% 98% 97% 98%  Weight:      Height:        CBC:  Recent Labs  Lab 03/02/21 1616 03/04/21 1223 03/05/21 0216 03/06/21 0156  WBC 7.6 8.7 6.9 5.4  NEUTROABS 6.1 6.7  --   --   HGB 14.8 16.3 14.3 15.1  HCT 43.6 49.1 41.4 44.1  MCV 87.4 89.3 84.8 84.3  PLT 212 238 221 683    Basic Metabolic Panel:  Recent Labs  Lab 03/05/21 0216 03/06/21 0156  NA 131* 136  K 4.1 3.5  CL 103 108  CO2 14* 17*  GLUCOSE 213* 105*  BUN 34* 29*  CREATININE 1.81* 1.54*  CALCIUM 9.0 9.5    Lipid Panel:     Component Value Date/Time   CHOL 152 03/04/2021 1322   TRIG 181 (H) 03/04/2021 1322   HDL 39 (L) 03/04/2021 1322   CHOLHDL 3.9 03/04/2021 1322   VLDL 36 03/04/2021 1322   LDLCALC 77 03/04/2021 1322   HgbA1c:  Lab Results  Component Value Date   HGBA1C 13.5 (H) 03/04/2021   Urine Drug Screen:     Component Value Date/Time   LABOPIA NONE DETECTED 03/05/2021 0600   COCAINSCRNUR NONE DETECTED 03/05/2021 0600   LABBENZ NONE DETECTED 03/05/2021 0600   AMPHETMU NONE DETECTED 03/05/2021 0600   THCU NONE DETECTED 03/05/2021 0600   LABBARB NONE DETECTED 03/05/2021 0600    Alcohol Level No results found for: ETH  IMAGING  MR BRAIN WO CONTRAST  Result Date: 03/04/2021 CLINICAL DATA:  Neuro deficit, acute, stroke suspected. EXAM: MRI HEAD WITHOUT CONTRAST TECHNIQUE: Multiplanar, multiecho pulse sequences of the brain and surrounding structures were obtained without intravenous contrast. COMPARISON:  Noncontrast head CT 03/02/2021. Brain MRI 03/02/2021. CT angiogram head/neck 03/03/2021. CT venogram head 03/03/2021. FINDINGS: Brain: Intermittently motion degraded  examination, limiting evaluation. Most notably, there is moderate/severe motion degradation of the sagittal T1 weighted sequence, moderate motion degradation of the axial T2 FLAIR sequence, moderate/severe motion degradation of the axial SWI sequence and severe motion degradation of the coronal T2 TSE sequence. Cerebral volume is normal for age. New from the prior brain MRI of 03/02/2021, there is subtle cortically-based restricted diffusion within the medial right occipital lobe spanning approximately 2.8 cm (for instance as seen on series 4, image 10). Findings are compatible with acute infarction. Minimal multifocal T2 FLAIR hyperintense signal abnormality within the cerebral white matter, nonspecific but compatible with chronic small vessel ischemic disease. 3 mm round T2 hyperintense focus within the midline pons, which may reflect a prominent perivascular space or chronic lacunar infarct (series 6, image 6). No evidence of an intracranial mass. No appreciable intracranial hemorrhage. No extra-axial fluid collection. No midline shift. Vascular: Maintained flow voids within the proximal large arterial vessels. Skull and upper cervical spine: No focal suspicious marrow lesion is identified. Sinuses/Orbits: Visualized orbits show no acute finding. No significant paranasal sinus disease. Other: Bilateral mastoid effusions. IMPRESSION: Motion degraded examination, as described. New from the prior brain MRI of 03/02/2021, there is subtle restricted diffusion within the medial right occipital lobe, spanning approximately 2.8 cm, compatible  with acute infarction (right PCA vascular territory). Minimal chronic small-vessel ischemic changes within the cerebral white matter. 3 mm prominent perivascular space versus chronic lacunar infarct within the midline pons. Bilateral mastoid effusions. Electronically Signed   By: Kellie Simmering D.O.   On: 03/04/2021 17:14   EEG adult  Result Date: 03/05/2021 Lora Havens, MD      03/05/2021  3:34 PM Patient Name: [name] MRN: [id number] Epilepsy Attending: Lora Havens Referring Physician/Provider: Dr Rosalin Hawking Date: 03/05/2021 Duration: 21.45 mins Patient history: 65yo M with right PCA stroke and visual hallucinations. EEG to evaluate for seizure. Level of alertness: Awake, asleep AEDs during EEG study: LEV Technical aspects: This EEG study was done with scalp electrodes positioned according to the 10-20 International system of electrode placement. Electrical activity was acquired at a sampling rate of 500Hz  and reviewed with a high frequency filter of 70Hz  and a low frequency filter of 1Hz . EEG data were recorded continuously and digitally stored. Description: No clear posterior dominant rhythm consists of 8 Hz activity of moderate voltage (25-35 uV) seen predominantly in posterior head regions, symmetric and reactive to eye opening and eye closing. Sleep was characterized by vertex waves, sleep spindles (12 to 14 Hz), maximal frontocentral region. Three seizure without clinical signs were noted on 03/05/2021 at 1431, 1436 and 1447 arising from right temporo-parietal region. Average duration 30 seconds. During seizure, eeg showed 5-6Hz  theta slowing in right temporo-parietal region which then involved all of right frontotemporal region as well as left frontal region and evolved into 3-4hz  delta slowing. Hyperventilation and photic stimulation were not performed.   ABNORMALITY - Seizure without clinical signs, right temporo-parietal region. IMPRESSION: This study showed three seizure without clinical signs on 03/05/2021 at 1431, 1436 and 1447 arising from right temporo-parietal region. Average duration 30 seconds. Dr Erlinda Hong was notified. Lora Havens   ECHOCARDIOGRAM COMPLETE BUBBLE STUDY  Result Date: 03/05/2021    ECHOCARDIOGRAM REPORT   Patient Name:   [name] Date of Exam: 03/05/2021 Medical Rec #:  [id number]     Height:       68.0 in Accession #:    [id number]     Weight:       190.0 lb Date of Birth:  October 28, 1955      BSA:          2.000 m Patient Age:    65 years      BP:           142/73 mmHg Patient Gender: M             HR:           79 bpm. Exam Location:  Inpatient Procedure: 2D Echo and Saline Contrast Bubble Study Indications:    Stroke I63.9  History:        Patient has no prior history of Echocardiogram examinations.                 Risk Factors:Hypertension, Dyslipidemia and Diabetes.  Sonographer:    Mikki Santee RDCS Referring Phys: [place]  1. Left ventricular ejection fraction, by estimation, is 70 to 75%. The left ventricle has hyperdynamic function. The left ventricle has no regional wall motion abnormalities. There is mild left ventricular hypertrophy. Left ventricular diastolic parameters were normal.  2. Right ventricular systolic function is normal. The right ventricular size is normal.  3. The mitral valve is normal in structure. Trivial mitral valve regurgitation. No evidence of  mitral stenosis.  4. The aortic valve is tricuspid. Aortic valve regurgitation is mild. No aortic stenosis is present.  5. The inferior vena cava is normal in size with greater than 50% respiratory variability, suggesting right atrial pressure of 3 mmHg.  6. Agitated saline contrast bubble study was negative, with no evidence of any interatrial shunt. FINDINGS  Left Ventricle: Left ventricular ejection fraction, by estimation, is 70 to 75%. The left ventricle has hyperdynamic function. The left ventricle has no regional wall motion abnormalities. The left ventricular internal cavity size was small. There is mild left ventricular hypertrophy. Left ventricular diastolic parameters were normal. Right Ventricle: The right ventricular size is normal. No increase in right ventricular wall thickness. Right ventricular systolic function is normal. Left Atrium: Left atrial size was normal in size. Right Atrium: Right atrial size was normal in size.  Pericardium: There is no evidence of pericardial effusion. Mitral Valve: The mitral valve is normal in structure. Trivial mitral valve regurgitation. No evidence of mitral valve stenosis. Tricuspid Valve: The tricuspid valve is normal in structure. Tricuspid valve regurgitation is trivial. Aortic Valve: The aortic valve is tricuspid. Aortic valve regurgitation is mild. Aortic regurgitation PHT measures 611 msec. No aortic stenosis is present. Pulmonic Valve: The pulmonic valve was not well visualized. Pulmonic valve regurgitation is not visualized. Aorta: The aortic root and ascending aorta are structurally normal, with no evidence of dilitation. Venous: The inferior vena cava is normal in size with greater than 50% respiratory variability, suggesting right atrial pressure of 3 mmHg. IAS/Shunts: The interatrial septum was not well visualized. Agitated saline contrast was given intravenously to evaluate for intracardiac shunting. Agitated saline contrast bubble study was negative, with no evidence of any interatrial shunt.  LEFT VENTRICLE PLAX 2D LVIDd:         3.70 cm   Diastology LVIDs:         2.50 cm   LV e' medial:    8.05 cm/s LV PW:         1.30 cm   LV E/e' medial:  9.6 LV IVS:        1.30 cm   LV e' lateral:   16.10 cm/s LVOT diam:     1.90 cm   LV E/e' lateral: 4.8 LV SV:         73 LV SV Index:   37 LVOT Area:     2.84 cm  RIGHT VENTRICLE RV S prime:     18.60 cm/s TAPSE (M-mode): 2.0 cm LEFT ATRIUM             Index        RIGHT ATRIUM           Index LA diam:        2.80 cm 1.40 cm/m   RA Area:     12.80 cm LA Vol (A2C):   32.6 ml 16.30 ml/m  RA Volume:   26.70 ml  13.35 ml/m LA Vol (A4C):   31.1 ml 15.55 ml/m LA Biplane Vol: 34.1 ml 17.05 ml/m  AORTIC VALVE LVOT Vmax:   155.00 cm/s LVOT Vmean:  101.000 cm/s LVOT VTI:    0.258 m AI PHT:      611 msec  AORTA Ao Root diam: 3.80 cm MITRAL VALVE MV Area (PHT): 3.74 cm    SHUNTS MV Decel Time: 203 msec    Systemic VTI:  0.26 m MV E velocity: 77.00  cm/s  Systemic Diam: 1.90 cm MV A velocity: 95.90 cm/s  MV E/A ratio:  0.80 Oswaldo Milian MD Electronically signed by Oswaldo Milian MD Signature Date/Time: 03/05/2021/1:04:14 PM    Final    VAS [place] LOWER EXTREMITY VENOUS (DVT)  Result Date: 03/05/2021  Lower Venous DVT Study Patient Name:  NAHMIR ZEIDMAN  Date of Exam:   03/05/2021 Medical Rec #: [id number]      Accession #:    [id number] Date of Birth: [birth year]       Patient Gender: M Patient Age:   67 years Exam Location:  [facility] Procedure:      VAS [place] LOWER EXTREMITY VENOUS (DVT) Referring Phys: Cornelius Moras XU --------------------------------------------------------------------------------  Indications: Stroke.  Comparison Study: No prior study on file Performing Technologist: Sharion Dove RVS  Examination Guidelines: A complete evaluation includes B-mode imaging, spectral Doppler, color Doppler, and power Doppler as needed of all accessible portions of each vessel. Bilateral testing is considered an integral part of a complete examination. Limited examinations for reoccurring indications may be performed as noted. The reflux portion of the exam is performed with the patient in reverse Trendelenburg.  +---------+---------------+---------+-----------+----------+--------------+  RIGHT     Compressibility Phasicity Spontaneity Properties Thrombus Aging  +---------+---------------+---------+-----------+----------+--------------+  CFV       Full            Yes       Yes                                    +---------+---------------+---------+-----------+----------+--------------+  SFJ       Full                                                             +---------+---------------+---------+-----------+----------+--------------+  FV Prox   Full                                                             +---------+---------------+---------+-----------+----------+--------------+  FV Mid    Full                                                              +---------+---------------+---------+-----------+----------+--------------+  FV Distal Full                                                             +---------+---------------+---------+-----------+----------+--------------+  PFV       Full                                                             +---------+---------------+---------+-----------+----------+--------------+  POP       Full            Yes       Yes                                    +---------+---------------+---------+-----------+----------+--------------+  PTV       Full                                                             +---------+---------------+---------+-----------+----------+--------------+  PERO      Full                                                             +---------+---------------+---------+-----------+----------+--------------+   +---------+---------------+---------+-----------+----------+--------------+  LEFT      Compressibility Phasicity Spontaneity Properties Thrombus Aging  +---------+---------------+---------+-----------+----------+--------------+  CFV       Full            Yes       Yes                                    +---------+---------------+---------+-----------+----------+--------------+  SFJ       Full                                                             +---------+---------------+---------+-----------+----------+--------------+  FV Prox   Full                                                             +---------+---------------+---------+-----------+----------+--------------+  FV Mid    Full                                                             +---------+---------------+---------+-----------+----------+--------------+  FV Distal Full                                                             +---------+---------------+---------+-----------+----------+--------------+  PFV       Full                                                              +---------+---------------+---------+-----------+----------+--------------+  POP       Full            Yes       Yes                                    +---------+---------------+---------+-----------+----------+--------------+  PTV       Full                                                             +---------+---------------+---------+-----------+----------+--------------+  PERO      Full                                                             +---------+---------------+---------+-----------+----------+--------------+    Summary: BILATERAL: - No evidence of deep vein thrombosis seen in the lower extremities, bilaterally. -No evidence of popliteal cyst, bilaterally.   *See table(s) above for measurements and observations.    Preliminary      ECG - SR rate 80 BPM. (See cardiology reading for complete details)   PHYSICAL EXAM  Temp:  [97.8 F (36.6 C)-98 F (36.7 C)] 98 F (36.7 C) (11/28 0810) Pulse Rate:  [72-93] 93 (11/28 0810) Resp:  [14-16] 14 (11/28 0400) BP: (117-142)/(72-95) 117/72 (11/28 0810) SpO2:  [97 %-98 %] 98 % (11/28 0810)  General - Well nourished, well developed, emotional especially with the visual hallucination episodes.  Ophthalmologic - fundi not visualized due to noncooperation.  Cardiovascular - Regular rhythm and rate.  Mental Status -  Level of arousal and orientation to time, place, and person were intact. Language including expression, naming, repetition, comprehension was assessed and found intact.  Cranial Nerves II - XII - II - left hemianopia III, IV, VI - Extraocular movements intact. V - Facial sensation intact bilaterally. VII - right nasolabial fold flattening, ? Related to poor denture VIII - Hearing & vestibular intact bilaterally. X - Palate elevates symmetrically. XI - Chin turning & shoulder shrug intact bilaterally. XII - Tongue protrusion intact.  Motor Strength - The patients strength was normal in all extremities and pronator  drift was absent.  Bulk was normal and fasciculations were absent.   Motor Tone - Muscle tone was assessed at the neck and appendages and was normal.  Reflexes - The patients reflexes were symmetrical in all extremities and he had no pathological reflexes.  Sensory - Light touch, temperature/pinprick were assessed and were symmetrical.    Coordination - The patient had normal movements in the hands with no ataxia or dysmetria.  Tremor was absent.  Gait and Station - deferred.    ASSESSMENT/PLAN Mr. [name] is a 64 y.o. male with history of GERD, CKD 3, HTN, DM2, prostate cancer, HLD, migraine with visual aura who presented 03/02/21 with hemianopsia x 3 days. Initial limited MRI negative for stroke (pt unable to tolerate complete MRI) Migraine suspected. The pt returned 03/04/21 with continued hemianopsia. Repeat MRI c/w Rt occipital stroke. Pt. not receive IV t-PA - outside window.   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 LE venous Doppler negative for DVT 2D Echo - EF 70 - 75%. No cardiac source of emboli identified. Bubble study was negative. Will consider TEE and loop recorder if work up neg - (tentatively scheduled for tomorrow, Tuesday, with Cardiology) UDS negative LDL - 77 HgbA1c - 13.5 VTE prophylaxis - Short [place] Heparin No antithrombotic prior to admission, now on aspirin 81 mg daily and clopidogrel 75 mg daily DAPT for 3 weeks and then aspirin. Patient will be counseled to be compliant with his antithrombotic medications Ongoing aggressive stroke risk factor management Therapy recommendations: pending Disposition:  Pending  Visual hallucinations Associated  with emotional changes, fear and eye pain Occurs at left visual field EEG - Seizure without clinical signs, right temporo-parietal. This study showed three seizure without clinical signs on 03/05/2021 at 1431, 1436 and 1447 arising from right temporo-parietal region. Average duration 30 seconds. Overnight EEG with video pending Will start Keppra (1.5g load followed with 500mg  bid) Will consider repeat limited MRI once visual hallucination improves  History of migraine Normally happens every 3 to 4 months Frequent visual aura with colored dots and wiggly lines ASA helps  Hypertension Home BP meds: Altace 5 mg daily Current BP meds: normodyne prn Stable Long-term BP goal normotensive  Hyperlipidemia Home Lipid lowering medication: Lipitor 10 mg daily  LDL 77, goal < 70 Current lipid lowering medication: Lipitor 20 mg daily  Continue statin at discharge  Diabetes Home diabetic meds: Jardiance, metformin HgbA1c -  13.5, goal < 7.0 SSI  CBG monitoring Close PCP follow-up for better DM control  Other Stroke Risk Factors Advanced age Former cigarette smoker - quit 42 years ago Family hx stroke (brother)   Other Active Problems, Code status - Full code CKD - stage III - creatinine 1.81->1.54   Hospital day # 1  I had long discussion with pt and wife at bedside, updated pt current condition, treatment plan and potential prognosis, and answered all the questions. They expressed understanding and appreciation. I spent  35 minutes in total face-to-face time with the patient, more than 50% of which was spent in counseling and coordination of care, reviewing test results, images and medication, and discussing the diagnosis, treatment plan and potential prognosis. This patient's care requiresreview of multiple databases, neurological assessment, discussion with family, other specialists and medical decision making of high complexity.       To contact Stroke Continuity provider,  please refer to [email or web address] After hours, contact General Neurology

## 2021-03-06 NOTE — Progress Notes (Signed)
Physical Therapy Treatment Patient Details Name: [name] MRN: [id number] DOB: [birth year] Today's Date: 03/06/2021   History of Present Illness Pt is a 65 y/o male who presents to the ED for persistent HA and vision changes 11/24. He was d/c home and returned with persistent visual symptoms and behavior changes. MRI revealed R PCA infarct. EEG positive for seizures. PMH significant for DM II, CKD IIIa, prostate CA.    PT Comments    Pt admitted with above diagnosis. Pt currently with functional limitations due to the deficits listed below (see PT Problem List). At the time of PT eval pt was able to perform transfers with gross min assist and minimal ambulation with +2 min assist for balance support and safety. Ambulation limited by EEG. Anticipate pt will progress well as he is less drowsy from medications and EEG is disconnected. If pt does not progress next session may want to consider an increased level of care at d/c. Pt will benefit from skilled PT to increase their independence and safety with mobility to allow discharge to the venue listed below.       Recommendations for follow up therapy are one component of a multi-disciplinary discharge planning process, led by the attending physician.  Recommendations may be updated based on patient status, additional functional criteria and insurance authorization.  Follow Up Recommendations  Outpatient PT     Assistance Recommended at Discharge PRN  Equipment Recommendations  Other (comment) (TBD as pt progresses mobility)    Recommendations for Other Services       Precautions / Restrictions Precautions Precautions: Fall Precaution Comments: Currently on EEG Restrictions Weight Bearing Restrictions: No     Mobility  Bed Mobility Overal bed mobility: Needs Assistance Bed Mobility: Supine to Sit;Sit to Supine     Supine to sit: Min guard Sit to supine: Min assist   General bed mobility comments: Assist to return to supine  with LE elevation back up into bed at end of session. Therapist managed EEG lines.    Transfers Overall transfer level: Needs assistance Equipment used: None Transfers: Sit to/from Stand Sit to Stand: Min assist           General transfer comment: Assist for power up to full stand and to gain/maintain standing balance.    Ambulation/Gait Ambulation/Gait assistance: Min assist Gait Distance (Feet): 3 Feet Assistive device: 2 person hand held assist Gait Pattern/deviations: Step-to pattern;Decreased stride length;Decreased weight shift to right;Trunk flexed Gait velocity: Decreased Gait velocity interpretation: <1.31 ft/sec, indicative of household ambulator   General Gait Details: Side steps EOB. Pt had difficulty shifting weight to the R to advance LLE to the L.   Stairs             Wheelchair Mobility    Modified Rankin (Stroke Patients Only) Modified Rankin (Stroke Patients Only) Pre-Morbid Rankin Score: No symptoms Modified Rankin: Moderately severe disability     Balance Overall balance assessment: Needs assistance Sitting-balance support: Feet supported;No upper extremity supported Sitting balance-Leahy Scale: Poor Sitting balance - Comments: posterior lean and R lateral lean without UE support Postural control: Posterior lean;Right lateral lean Standing balance support: No upper extremity supported Standing balance-Leahy Scale: Poor Standing balance comment: With dynamic movement, poor and required assist to maintain balance.                            Cognition Arousal/Alertness: Lethargic;Suspect due to medications Behavior During Therapy: Flat affect Overall Cognitive Status:  Impaired/Different from baseline Area of Impairment: Attention;Following commands;Safety/judgement;Awareness                   Current Attention Level: Sustained   Following Commands: Follows one step commands with increased time Safety/Judgement:  Decreased awareness of safety;Decreased awareness of deficits Awareness: Emergent   General Comments: OAx4; lethargic during the session and required cues to keep eyes open. Pt was deliberate and required more time w/ tasks        Exercises      General Comments        Pertinent Vitals/Pain Pain Assessment: No/denies pain    Home Living Family/patient expects to be discharged to:: Private residence Living Arrangements: Spouse/significant other Available Help at Discharge: Family;Available 24 hours/day Type of Home: Mobile home Home Access: Stairs to enter;Level entry   Entrance Stairs-Number of Steps: 3-4 (steps for front enterance, level entry in the back)   Home Layout: One level Home Equipment: None      Prior Function            PT Goals (current goals can now be found in the care plan section) Acute Rehab PT Goals Patient Stated Goal: None stated - wife reports she wants to take the pt home at d/c. PT Goal Formulation: With patient/family Time For Goal Achievement: 03/13/21 Potential to Achieve Goals: Good    Frequency    Min 4X/week      PT Plan      Co-evaluation              AM-PAC PT "6 Clicks" Mobility   Outcome Measure  Help needed turning from your back to your side while in a flat bed without using bedrails?: None Help needed moving from lying on your back to sitting on the side of a flat bed without using bedrails?: A Little Help needed moving to and from a bed to a chair (including a wheelchair)?: A Little Help needed standing up from a chair using your arms (e.g., wheelchair or bedside chair)?: A Little Help needed to walk in hospital room?: A Little Help needed climbing 3-5 steps with a railing? : A Lot 6 Click Score: 18    End of Session Equipment Utilized During Treatment: Gait belt Activity Tolerance: Patient tolerated treatment well Patient left: in bed;with call bell/phone within reach;with bed alarm set;with  family/visitor present Nurse Communication: Mobility status PT Visit Diagnosis: Unsteadiness on feet (R26.81);Other symptoms and signs involving the nervous system (R29.898)     Time: 7824-2353 PT Time Calculation (min) (ACUTE ONLY): 23 min  Charges:  $Gait Training: 8-22 mins                     Rolinda Roan, PT, DPT Acute Rehabilitation Services Pager: [phone] Office: [phone]    Thelma Comp 03/06/2021, 1:09 PM

## 2021-03-06 NOTE — Progress Notes (Signed)
STROKE TEAM PROGRESS NOTE   INTERVAL HISTORY His wife is at the bedside.  Pt lying in bed, drowsy but easily arousable and fully orientated. Still on LTM EEG.  Yesterday EEG showed 3 seizures within 20 min routine EEG after keppra load. He was then loaded with fosphenytoin and put on clonazepam. Overnight, he still had 4 brief seizure and will add vimpat. Pt denies any more bright lights and visual hallucination.   OBJECTIVE Vitals:   03/05/21 2340 03/06/21 0400 03/06/21 0810 03/06/21 1213  BP: (!) 142/87 (!) 121/95 117/72 (!) 122/95  Pulse: 72 89 93 86  Resp: 15 14  17   Temp: 97.8 F (36.6 C) 97.8 F (36.6 C) 98 F (36.7 C) 97.7 F (36.5 C)  TempSrc: Oral Oral    SpO2: 98% 97% 98% 98%  Weight:      Height:        CBC:  Recent Labs  Lab 03/02/21 1616 03/04/21 1223 03/05/21 0216 03/06/21 0156  WBC 7.6 8.7 6.9 5.4  NEUTROABS 6.1 6.7  --   --   HGB 14.8 16.3 14.3 15.1  HCT 43.6 49.1 41.4 44.1  MCV 87.4 89.3 84.8 84.3  PLT 212 238 221 902    Basic Metabolic Panel:  Recent Labs  Lab 03/05/21 0216 03/06/21 0156  NA 131* 136  K 4.1 3.5  CL 103 108  CO2 14* 17*  GLUCOSE 213* 105*  BUN 34* 29*  CREATININE 1.81* 1.54*  CALCIUM 9.0 9.5    Lipid Panel:     Component Value Date/Time   CHOL 152 03/04/2021 1322   TRIG 181 (H) 03/04/2021 1322   HDL 39 (L) 03/04/2021 1322   CHOLHDL 3.9 03/04/2021 1322   VLDL 36 03/04/2021 1322   LDLCALC 77 03/04/2021 1322   HgbA1c:  Lab Results  Component Value Date   HGBA1C 13.5 (H) 03/04/2021   Urine Drug Screen:     Component Value Date/Time   LABOPIA NONE DETECTED 03/05/2021 0600   COCAINSCRNUR NONE DETECTED 03/05/2021 0600   LABBENZ NONE DETECTED 03/05/2021 0600   AMPHETMU NONE DETECTED 03/05/2021 0600   THCU NONE DETECTED 03/05/2021 0600   LABBARB NONE DETECTED 03/05/2021 0600    Alcohol Level No results found for: ETH  IMAGING  MR BRAIN WO CONTRAST  Result Date: 03/04/2021 CLINICAL DATA:  Neuro deficit, acute,  stroke suspected. EXAM: MRI HEAD WITHOUT CONTRAST TECHNIQUE: Multiplanar, multiecho pulse sequences of the brain and surrounding structures were obtained without intravenous contrast. COMPARISON:  Noncontrast head CT 03/02/2021. Brain MRI 03/02/2021. CT angiogram head/neck 03/03/2021. CT venogram head 03/03/2021. FINDINGS: Brain: Intermittently motion degraded examination, limiting evaluation. Most notably, there is moderate/severe motion degradation of the sagittal T1 weighted sequence, moderate motion degradation of the axial T2 FLAIR sequence, moderate/severe motion degradation of the axial SWI sequence and severe motion degradation of the coronal T2 TSE sequence. Cerebral volume is normal for age. New from the prior brain MRI of 03/02/2021, there is subtle cortically-based restricted diffusion within the medial right occipital lobe spanning approximately 2.8 cm (for instance as seen on series 4, image 10). Findings are compatible with acute infarction. Minimal multifocal T2 FLAIR hyperintense signal abnormality within the cerebral white matter, nonspecific but compatible with chronic small vessel ischemic disease. 3 mm round T2 hyperintense focus within the midline pons, which may reflect a prominent perivascular space or chronic lacunar infarct (series 6, image 6). No evidence of an intracranial mass. No appreciable intracranial hemorrhage. No extra-axial fluid collection. No midline shift. Vascular:  Maintained flow voids within the proximal large arterial vessels. Skull and upper cervical spine: No focal suspicious marrow lesion is identified. Sinuses/Orbits: Visualized orbits show no acute finding. No significant paranasal sinus disease. Other: Bilateral mastoid effusions. IMPRESSION: Motion degraded examination, as described. New from the prior brain MRI of 03/02/2021, there is subtle restricted diffusion within the medial right occipital lobe, spanning approximately 2.8 cm, compatible with acute infarction  (right PCA vascular territory). Minimal chronic small-vessel ischemic changes within the cerebral white matter. 3 mm prominent perivascular space versus chronic lacunar infarct within the midline pons. Bilateral mastoid effusions. Electronically Signed   By: Kellie Simmering D.O.   On: 03/04/2021 17:14   EEG adult  Result Date: 03/05/2021 Lora Havens, MD     03/05/2021  3:34 PM Patient Name: NIMESH RIOLO MRN: [id number] Epilepsy Attending: Lora Havens Referring Physician/Provider: Dr Rosalin Hawking Date: 03/05/2021 Duration: 21.45 mins Patient history: 65yo M with right PCA stroke and visual hallucinations. EEG to evaluate for seizure. Level of alertness: Awake, asleep AEDs during EEG study: LEV Technical aspects: This EEG study was done with scalp electrodes positioned according to the 10-20 International system of electrode placement. Electrical activity was acquired at a sampling rate of 500Hz  and reviewed with a high frequency filter of 70Hz  and a low frequency filter of 1Hz . EEG data were recorded continuously and digitally stored. Description: No clear posterior dominant rhythm consists of 8 Hz activity of moderate voltage (25-35 uV) seen predominantly in posterior head regions, symmetric and reactive to eye opening and eye closing. Sleep was characterized by vertex waves, sleep spindles (12 to 14 Hz), maximal frontocentral region. Three seizure without clinical signs were noted on 03/05/2021 at 1431, 1436 and 1447 arising from right temporo-parietal region. Average duration 30 seconds. During seizure, eeg showed 5-6Hz  theta slowing in right temporo-parietal region which then involved all of right frontotemporal region as well as left frontal region and evolved into 3-4hz  delta slowing. Hyperventilation and photic stimulation were not performed.   ABNORMALITY - Seizure without clinical signs, right temporo-parietal region. IMPRESSION: This study showed three seizure without clinical signs on 03/05/2021  at 1431, 1436 and 1447 arising from right temporo-parietal region. Average duration 30 seconds. Dr Erlinda Hong was notified. Priyanka [name]   Overnight EEG with video  Result Date: 03/06/2021 Lora Havens, MD     03/06/2021 10:55 AM Patient Name: AKIL HOOS MRN: [id number] Epilepsy Attending: Lora Havens Referring Physician/Provider: Dr Rosalin Hawking Duration: 03/05/2021 1746 to11/28/2022 1045  Patient history: 65yo M with right PCA stroke and visual hallucinations. EEG to evaluate for seizure.  Level of alertness: Awake, asleep  AEDs during EEG study: PHT, Clonazepam  Technical aspects: This EEG study was done with scalp electrodes positioned according to the 10-20 International system of electrode placement. Electrical activity was acquired at a sampling rate of 500Hz  and reviewed with a high frequency filter of 70Hz  and a low frequency filter of 1Hz . EEG data were recorded continuously and digitally stored.  Description: No clear posterior dominant rhythm consists of 8 Hz activity of moderate voltage (25-35 uV) seen predominantly in posterior head regions, symmetric and reactive to eye opening and eye closing. Sleep was characterized by vertex waves, sleep spindles (12 to 14 Hz), maximal frontocentral region.  Four seizure without clinical signs were noted on 03/05/2021 at 2353 and on 03/06/2021 at [place], [place] and 0656 arising from right temporo-parietal region. Average duration 30 seconds. During seizure, eeg showed 5-6Hz  theta slowing in  right temporo-parietal region which then involved all of right frontotemporal region as well as left frontal region and evolved into 3-4hz  delta slowing. EEG also showed lateralized periodic discharges in right hemisphere, maximal right temporoparietal region at 1 Hz which at times appeared rhythmic consistent with brief ictal-interictal rhythmic discharges  Hyperventilation and photic stimulation were not performed.    ABNORMALITY - Seizure without clinical signs, right  temporo-parietal region. -Brief ictal-interictal rhythmic discharges, right hemisphere, maximal right temporoparietal region -Lateralized paretic discharges,right hemisphere, maximal right temporoparietal region  IMPRESSION: This study showed four seizure without clinical signs were noted on 03/05/2021 at 2353 and on 03/06/2021 at [place], [place] and 0656 arising rom right temporo-parietal region. Average duration 30 seconds. Additionally, EEG showed evidence of cortical dysfunction and epileptogenicity arising from right hemisphere, maximal right temporoparietal region likely due to underlying structural abnormality with high potential for seizure recurrence.  Lora Havens   ECHOCARDIOGRAM COMPLETE BUBBLE STUDY  Result Date: 03/05/2021    ECHOCARDIOGRAM REPORT   Patient Name:   CARVELL HOEFFNER Date of Exam: 03/05/2021 Medical Rec #:  [id number]     Height:       68.0 in Accession #:    [id number]    Weight:       190.0 lb Date of Birth:  01-02-1956      BSA:          2.000 m Patient Age:    33 years      BP:           142/73 mmHg Patient Gender: M             HR:           79 bpm. Exam Location:  Inpatient Procedure: 2D Echo and Saline Contrast Bubble Study Indications:    Stroke I63.9  History:        Patient has no prior history of Echocardiogram examinations.                 Risk Factors:Hypertension, Dyslipidemia and Diabetes.  Sonographer:    Mikki Santee RDCS Referring Phys: [place]  1. Left ventricular ejection fraction, by estimation, is 70 to 75%. The left ventricle has hyperdynamic function. The left ventricle has no regional wall motion abnormalities. There is mild left ventricular hypertrophy. Left ventricular diastolic parameters were normal.  2. Right ventricular systolic function is normal. The right ventricular size is normal.  3. The mitral valve is normal in structure. Trivial mitral valve regurgitation. No evidence of mitral stenosis.  4. The aortic valve is  tricuspid. Aortic valve regurgitation is mild. No aortic stenosis is present.  5. The inferior vena cava is normal in size with greater than 50% respiratory variability, suggesting right atrial pressure of 3 mmHg.  6. Agitated saline contrast bubble study was negative, with no evidence of any interatrial shunt. FINDINGS  Left Ventricle: Left ventricular ejection fraction, by estimation, is 70 to 75%. The left ventricle has hyperdynamic function. The left ventricle has no regional wall motion abnormalities. The left ventricular internal cavity size was small. There is mild left ventricular hypertrophy. Left ventricular diastolic parameters were normal. Right Ventricle: The right ventricular size is normal. No increase in right ventricular wall thickness. Right ventricular systolic function is normal. Left Atrium: Left atrial size was normal in size. Right Atrium: Right atrial size was normal in size. Pericardium: There is no evidence of pericardial effusion. Mitral Valve: The mitral valve is normal in structure. Trivial mitral  valve regurgitation. No evidence of mitral valve stenosis. Tricuspid Valve: The tricuspid valve is normal in structure. Tricuspid valve regurgitation is trivial. Aortic Valve: The aortic valve is tricuspid. Aortic valve regurgitation is mild. Aortic regurgitation PHT measures 611 msec. No aortic stenosis is present. Pulmonic Valve: The pulmonic valve was not well visualized. Pulmonic valve regurgitation is not visualized. Aorta: The aortic root and ascending aorta are structurally normal, with no evidence of dilitation. Venous: The inferior vena cava is normal in size with greater than 50% respiratory variability, suggesting right atrial pressure of 3 mmHg. IAS/Shunts: The interatrial septum was not well visualized. Agitated saline contrast was given intravenously to evaluate for intracardiac shunting. Agitated saline contrast bubble study was negative, with no evidence of any interatrial  shunt.  LEFT VENTRICLE PLAX 2D LVIDd:         3.70 cm   Diastology LVIDs:         2.50 cm   LV e' medial:    8.05 cm/s LV PW:         1.30 cm   LV E/e' medial:  9.6 LV IVS:        1.30 cm   LV e' lateral:   16.10 cm/s LVOT diam:     1.90 cm   LV E/e' lateral: 4.8 LV SV:         73 LV SV Index:   37 LVOT Area:     2.84 cm  RIGHT VENTRICLE RV S prime:     18.60 cm/s TAPSE (M-mode): 2.0 cm LEFT ATRIUM             Index        RIGHT ATRIUM           Index LA diam:        2.80 cm 1.40 cm/m   RA Area:     12.80 cm LA Vol (A2C):   32.6 ml 16.30 ml/m  RA Volume:   26.70 ml  13.35 ml/m LA Vol (A4C):   31.1 ml 15.55 ml/m LA Biplane Vol: 34.1 ml 17.05 ml/m  AORTIC VALVE LVOT Vmax:   155.00 cm/s LVOT Vmean:  101.000 cm/s LVOT VTI:    0.258 m AI PHT:      611 msec  AORTA Ao Root diam: 3.80 cm MITRAL VALVE MV Area (PHT): 3.74 cm    SHUNTS MV Decel Time: 203 msec    Systemic VTI:  0.26 m MV E velocity: 77.00 cm/s  Systemic Diam: 1.90 cm MV A velocity: 95.90 cm/s MV E/A ratio:  0.80 Oswaldo Milian MD Electronically signed by Oswaldo Milian MD Signature Date/Time: 03/05/2021/1:04:14 PM    Final    VAS [place] LOWER EXTREMITY VENOUS (DVT)  Result Date: 03/06/2021  Lower Venous DVT Study Patient Name:  NGHIA MCENTEE  Date of Exam:   03/05/2021 Medical Rec #: [id number]      Accession #:    [id number] Date of Birth: [birth year]       Patient Gender: M Patient Age:   3 years Exam Location:  [facility] Procedure:      VAS [place] LOWER EXTREMITY VENOUS (DVT) Referring Phys: Cornelius Moras Jadalynn Burr --------------------------------------------------------------------------------  Indications: Stroke.  Comparison Study: No prior study on file Performing Technologist: Sharion Dove RVS  Examination Guidelines: A complete evaluation includes B-mode imaging, spectral Doppler, color Doppler, and power Doppler as needed of all accessible portions of each vessel. Bilateral testing is considered an integral part of a complete examination.  Limited examinations for  reoccurring indications may be performed as noted. The reflux portion of the exam is performed with the patient in reverse Trendelenburg.  +---------+---------------+---------+-----------+----------+--------------+ RIGHT    CompressibilityPhasicitySpontaneityPropertiesThrombus Aging +---------+---------------+---------+-----------+----------+--------------+ CFV      Full           Yes      Yes                                 +---------+---------------+---------+-----------+----------+--------------+ SFJ      Full                                                        +---------+---------------+---------+-----------+----------+--------------+ FV Prox  Full                                                        +---------+---------------+---------+-----------+----------+--------------+ FV Mid   Full                                                        +---------+---------------+---------+-----------+----------+--------------+ FV DistalFull                                                        +---------+---------------+---------+-----------+----------+--------------+ PFV      Full                                                        +---------+---------------+---------+-----------+----------+--------------+ POP      Full           Yes      Yes                                 +---------+---------------+---------+-----------+----------+--------------+ PTV      Full                                                        +---------+---------------+---------+-----------+----------+--------------+ PERO     Full                                                        +---------+---------------+---------+-----------+----------+--------------+   +---------+---------------+---------+-----------+----------+--------------+ LEFT     CompressibilityPhasicitySpontaneityPropertiesThrombus Aging  +---------+---------------+---------+-----------+----------+--------------+ CFV      Full  Yes      Yes                                 +---------+---------------+---------+-----------+----------+--------------+ SFJ      Full                                                        +---------+---------------+---------+-----------+----------+--------------+ FV Prox  Full                                                        +---------+---------------+---------+-----------+----------+--------------+ FV Mid   Full                                                        +---------+---------------+---------+-----------+----------+--------------+ FV DistalFull                                                        +---------+---------------+---------+-----------+----------+--------------+ PFV      Full                                                        +---------+---------------+---------+-----------+----------+--------------+ POP      Full           Yes      Yes                                 +---------+---------------+---------+-----------+----------+--------------+ PTV      Full                                                        +---------+---------------+---------+-----------+----------+--------------+ PERO     Full                                                        +---------+---------------+---------+-----------+----------+--------------+     Summary: BILATERAL: - No evidence of deep vein thrombosis seen in the lower extremities, bilaterally. -No evidence of popliteal cyst, bilaterally.   *See table(s) above for measurements and observations. Electronically signed by Orlie Pollen on 03/06/2021 at 10:22:58 AM.    Final      ECG - SR rate 80 BPM. (See cardiology reading for complete details)   PHYSICAL EXAM  Temp:  [97.7  F (36.5 C)-98 F (36.7 C)] 97.7 F (36.5 C) (11/28 1213) Pulse Rate:  [72-93] 86 (11/28 1213) Resp:   [14-17] 17 (11/28 1213) BP: (117-142)/(72-95) 122/95 (11/28 1213) SpO2:  [97 %-98 %] 98 % (11/28 1213)  General - Well nourished, well developed, drowsy but easily arousable.  Ophthalmologic - fundi not visualized due to noncooperation.  Cardiovascular - Regular rhythm and rate.  Mental Status -  Level of arousal and orientation to time, place, and person were intact. Language including expression, naming, repetition, comprehension was assessed and found intact.  Cranial Nerves II - XII - II - left hemianopia III, IV, VI - Extraocular movements intact. V - Facial sensation intact bilaterally. VII - right nasolabial fold flattening, ? Related to poor denture VIII - Hearing & vestibular intact bilaterally. X - Palate elevates symmetrically. XI - Chin turning & shoulder shrug intact bilaterally. XII - Tongue protrusion intact.  Motor Strength - The patient's strength was normal in all extremities and pronator drift was absent.  Bulk was normal and fasciculations were absent.   Motor Tone - Muscle tone was assessed at the neck and appendages and was normal.  Reflexes - The patient's reflexes were symmetrical in all extremities and he had no pathological reflexes.  Sensory - Light touch, temperature/pinprick were assessed and were symmetrical.    Coordination - The patient had normal movements in the hands with no ataxia or dysmetria.  Tremor was absent.  Gait and Station - deferred.    ASSESSMENT/PLAN Mr. [name] is a 65 y.o. male with history of GERD, CKD 3, HTN, DM2, prostate cancer, HLD, migraine with visual aura who presented 03/02/21 with hemianopsia x 3 days. Initial limited MRI negative for stroke (pt unable to tolerate complete MRI) Migraine suspected. The pt returned 03/04/21 with continued hemianopsia. Repeat MRI c/w Rt occipital stroke. Pt. not receive IV t-PA - outside window.   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 LE venous Doppler negative for DVT 2D Echo - EF 70-75% LE venous doppler - neg for DVT Will consider TEE and loop recorder once seizure under control UDS negative LDL - 77 HgbA1c - 13.5 VTE prophylaxis - [place] Heparin No antithrombotic prior to admission, now on aspirin 81 mg daily and clopidogrel 75 mg daily DAPT for 3 weeks and then aspirin alone Patient will be counseled to be compliant with his antithrombotic medications Ongoing aggressive stroke risk factor management Therapy recommendations: outpt PT Disposition:  Pending  Seizure  Presented with visual hallucination associated with emotional changes, fear and eye pain, occurs at left visual field -> improved with no more visual hallucinations EEG showed three seizure without clinical signs on 03/05/2021 at 1431, 1436 and 1447 arising from right temporo-parietal region LTM EEG  four seizure without clinical signs were noted on 03/05/2021 at 2353 and on 03/06/2021 at [place], 0545 and 0656 arising rom right temporo-parietal region. Average duration 30 seconds.  S/p keppra load 1.5g S/p fosphenytoin load, now on dilantin 100 Q8 Add vimpat 200 bid D/c clonazepam will consider repeat MRI w and w/o contrast once of EEG  History of migraine Normally happens every 3 to 4 months Frequent visual aura with colored dots and wiggly lines ASA helps  Hypertension Home BP meds: Altace 5 mg daily Current BP meds: normodyne prn Stable Long-term BP goal normotensive  Hyperlipidemia Home Lipid lowering medication: Lipitor 10 mg daily  LDL 77, goal < 70 Current lipid lowering medication: Lipitor 20 mg daily  Continue statin at discharge  Diabetes Home diabetic meds:  Jardiance, metformin HgbA1c - 13.5, goal < 7.0 SSI  CBG monitoring Close PCP follow-up for better DM control  Other Stroke Risk Factors Advanced age Former cigarette smoker - quit 42 years ago Family hx stroke (brother)   Other Active Problems, Code status - Full code CKD - stage III - creatinine 1.81->1.54   Hospital day # 1  Pt continued to have seizure, adjusted AEDs and continue EEG monitoring. I had long discussion with pt and wife at bedside, updated pt current condition, treatment plan and potential prognosis, and answered all the questions. They expressed understanding and appreciation. I discussed with Dr. Hortense Ramal. I spent  35 minutes in total face-to-face time with the patient, more than 50% of which was spent in counseling and coordination of care, reviewing test results, images and medication, and discussing the diagnosis, treatment plan and potential prognosis. This patient's care requiresreview of multiple databases, neurological assessment, discussion with family, other specialists and medical decision making of high complexity.    Rosalin Hawking, MD PhD Stroke Neurology 03/06/2021 2:09 PM   To contact Stroke Continuity provider, please refer to [email or web address] After hours, contact General Neurology

## 2021-03-06 NOTE — Progress Notes (Signed)
LTM maintained. Reapplied Fp2, O1, C3, and ground leads. Impedance = good. Results pending.

## 2021-03-06 NOTE — Progress Notes (Addendum)
[facility] PROGRESS NOTE   [name] XTK:[id number] DOB: Nov 01, 1955 DOA: 03/04/2021  PCP: [name], DO  Brief History/Interval Summary: 65 y.o. male with medical history significant for type 2 diabetes, HLD, CKD stage IIIa, prostate cancer who presented to the ED for persistent headache and change in vision.  Initially presented on 11/24.  MRI brain was limited evaluation due to patient's intolerance.  Patient was discharged home.  Came back with persistent visual deficits.  Repeat MRI showed an acute stroke in the occipital lobe.  Patient was hospitalized for further management.  Patient seen by neurology.  His visual symptoms were concerning also for seizure activity.  EEG confirmed this.  Patient started on phenytoin.    Reason for Visit: Acute stroke  Consultants: Neurology  Procedures:  Transthoracic echocardiogram EEG    Subjective/Interval History: Patient noted to be somnolent this morning though easily arousable.  Wife is at the bedside.  Apparently he was awake just a few minutes ago.  Patient's visual symptoms have improved.  He was not as agitated last night as the night before.  Managed to get some sleep.  No headaches reported.     Assessment/Plan:  Acute stroke Involving right occipital lobe.  Significant left homonymous hemianopsia.  Seeing flashing lights.  Patient underwent CT angiogram head and neck as well.  Also underwent CT venogram of head. Patient remains on aspirin, Plavix and statin.   LDL is 77.  HbA1c is 13.5.  Urine drug screen was unremarkable. Echocardiogram shows normal systolic function.  No shunt was noted.  No embolic source.   No DVT noted on lower extremity Doppler studies. PT and OT evaluation is pending. TEE and loop recorder is recommended by neurology.  Seizures His visual symptoms were thought to be due to seizure activity.  EEG was done which showed a seizure activity.  Patient was started on phenytoin.  Symptoms  seem to be better this morning.  Currently on long-term monitoring.   Further management per neurology.  Anxiety Due to his acute visual disturbances.  Xanax as needed.  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  Somewhat surprising considering his CBGs are not that abnormal.  He was on metformin and Jardiance prior to admission.  Continue SSI.  CBGs are reasonably well controlled.   Chronic kidney disease stage IIIa/metabolic acidosis Recent baseline is unknown.  Noted to be worse compared to 2019 when it was 1.49.  ACE inhibitor currently being held.  Creatinine down to 1.54 today.  Bicarbonate level has also improved.  Continue to monitor.    Essential hypertension/hyponatremia Holding ACE inhibitor.  Allowing some degree of permissive hypertension. Recheck sodium levels tomorrow.  Blood pressure reasonably well controlled. Sodium level is normal today.  Hyperlipidemia Continue statin.    DVT Prophylaxis: Subcutaneous heparin Code Status: Full code Family Communication: Discussed with patient's wife Disposition Plan: Hopefully return home when improved.  Status is: Inpatient  Remains inpatient appropriate because: Stroke, acute seizures       Medications: Scheduled:  aspirin  81 mg Oral Daily   atorvastatin  20 mg Oral QHS   clonazePAM  1 mg Oral BID   clopidogrel  75 mg Oral Daily   heparin  5,000 Units Subcutaneous Q8H   insulin aspart  0-9 Units Subcutaneous TID WC   pantoprazole  40 mg Oral q morning   phenytoin (DILANTIN) IV  100 mg Intravenous Q8H   vitamin B-12  500 mcg Oral Daily   Continuous: WUX:[id number] **OR** acetaminophen (TYLENOL) oral liquid 160 mg/5 mL **OR** acetaminophen, ALPRAZolam, labetalol, ondansetron (ZOFRAN) IV, senna-docusate  Antibiotics: Anti-infectives (From admission, onward)    None       Objective:  Vital Signs  Vitals:   03/05/21 1956 03/05/21  2340 03/06/21 0400 03/06/21 0810  BP: 122/78 (!) 142/87 (!) 121/95 117/72  Pulse: 83 72 89 93  Resp: 16 15 14    Temp: 97.8 F (36.6 C) 97.8 F (36.6 C) 97.8 F (36.6 C) 98 F (36.7 C)  TempSrc: Oral Oral Oral   SpO2: 97% 98% 97% 98%  Weight:      Height:        Intake/Output Summary (Last 24 hours) at 03/06/2021 0843 Last data filed at 03/06/2021 0500 Gross per 24 hour  Intake 330 ml  Output 400 ml  Net -70 ml    Filed Weights   03/04/21 2040  Weight: 86.2 kg    General appearance: Somnolent but easily arousable.  In no distress. Resp: Clear to auscultation bilaterally.  Normal effort Cardio: S1-S2 is normal regular.  No S3-S4.  No rubs murmurs or bruit GI: Abdomen is soft.  Nontender nondistended.  Bowel sounds are present normal.  No masses organomegaly Extremities: No edema.   Neurologic: Noted to be moving all of his extremities.  No obvious focal neurological deficits noted   Lab Results:  Data Reviewed: I have personally reviewed following labs and imaging studies  CBC: Recent Labs  Lab 03/02/21 1616 03/04/21 1223 03/05/21 0216 03/06/21 0156  WBC 7.6 8.7 6.9 5.4  NEUTROABS 6.1 6.7  --   --   HGB 14.8 16.3 14.3 15.1  HCT 43.6 49.1 41.4 44.1  MCV 87.4 89.3 84.8 84.3  PLT 212 238 221 219     Basic Metabolic Panel: Recent Labs  Lab 03/02/21 1616 03/04/21 1223 03/05/21 0216 03/06/21 0156  NA 132* 136 131* 136  K 4.8 4.3 4.1 3.5  CL 99 105 103 108  CO2 20* 13* 14* 17*  GLUCOSE 371* 200* 213* 105*  BUN 37* 35* 34* 29*  CREATININE 2.05* 1.81* 1.81* 1.54*  CALCIUM 9.7 9.9 9.0 9.5     GFR: Estimated Creatinine Clearance: 51.1 mL/min (A) (by C-G formula based on SCr of 1.54 mg/dL (H)).  Liver Function Tests: Recent Labs  Lab 03/02/21 1616  AST 12*  ALT 21  ALKPHOS 86  BILITOT 0.9  PROT 7.4  ALBUMIN 4.6     Recent Labs  Lab 03/02/21 1616  LIPASE 37     Coagulation Profile: Recent Labs  Lab 03/04/21 1223  INR 0.9       CBG: Recent Labs  Lab 03/05/21 0620 03/05/21 1501 03/05/21 2120 03/06/21 0539 03/06/21 0811  GLUCAP 179* 133* 140* 114* 290*     Lipid Profile: Recent Labs    03/04/21 1322  CHOL 152  HDL 39*  LDLCALC 77  TRIG 181*  CHOLHDL 3.9      Recent Results (from the past 240 hour(s))  Resp Panel by RT-PCR (Flu A&B, Covid) Nasopharyngeal Swab     Status: None   Collection Time: 03/02/21  8:57 PM   Specimen: Nasopharyngeal Swab; Nasopharyngeal(NP) swabs in vial transport medium  Result Value Ref Range Status   SARS Coronavirus 2 by RT PCR NEGATIVE NEGATIVE Final    Comment: (NOTE) SARS-CoV-2 target nucleic acids are NOT DETECTED.  The SARS-CoV-2 RNA is generally detectable in upper respiratory specimens during the acute phase of  infection. The lowest concentration of SARS-CoV-2 viral copies this assay can detect is 138 copies/mL. A negative result does not preclude SARS-Cov-2 infection and should not be used as the sole basis for treatment or other patient management decisions. A negative result may occur with  improper specimen collection/handling, submission of specimen other than nasopharyngeal swab, presence of viral mutation(s) within the areas targeted by this assay, and inadequate number of viral copies(<138 copies/mL). A negative result must be combined with clinical observations, patient history, and epidemiological information. The expected result is Negative.  Fact Sheet for Patients:  EntrepreneurPulse.com.au  Fact Sheet for Healthcare Providers:  IncredibleEmployment.be  This test is no t yet approved or cleared by the [place] FDA and  has been authorized for detection and/or diagnosis of SARS-CoV-2 by FDA under an Emergency Use Authorization (EUA). This EUA will remain  in effect (meaning this test can be used) for the duration of the COVID-19 declaration under Section 564(b)(1) of the Act,  21 U.S.C.section 360bbb-3(b)(1), unless the authorization is terminated  or revoked sooner.       Influenza A by PCR NEGATIVE NEGATIVE Final   Influenza B by PCR NEGATIVE NEGATIVE Final    Comment: (NOTE) The Xpert Xpress SARS-CoV-2/FLU/RSV plus assay is intended as an aid in the diagnosis of influenza from Nasopharyngeal swab specimens and should not be used as a sole basis for treatment. Nasal washings and aspirates are unacceptable for Xpert Xpress SARS-CoV-2/FLU/RSV testing.  Fact Sheet for Patients: EntrepreneurPulse.com.au  Fact Sheet for Healthcare Providers: IncredibleEmployment.be  This test is not yet approved or cleared by the [place] FDA and has been authorized for detection and/or diagnosis of SARS-CoV-2 by FDA under an Emergency Use Authorization (EUA). This EUA will remain in effect (meaning this test can be used) for the duration of the COVID-19 declaration under Section 564(b)(1) of the Act, 21 U.S.C. section 360bbb-3(b)(1), unless the authorization is terminated or revoked.  Performed at [facility], [address], [place], [place] [address]        Radiology Studies: MR BRAIN WO CONTRAST  Result Date: 03/04/2021 CLINICAL DATA:  Neuro deficit, acute, stroke suspected. EXAM: MRI HEAD WITHOUT CONTRAST TECHNIQUE: Multiplanar, multiecho pulse sequences of the brain and surrounding structures were obtained without intravenous contrast. COMPARISON:  Noncontrast head CT 03/02/2021. Brain MRI 03/02/2021. CT angiogram head/neck 03/03/2021. CT venogram head 03/03/2021. FINDINGS: Brain: Intermittently motion degraded examination, limiting evaluation. Most notably, there is moderate/severe motion degradation of the sagittal T1 weighted sequence, moderate motion degradation of the axial T2 FLAIR sequence, moderate/severe motion degradation of the axial SWI sequence and severe motion degradation of the coronal T2 TSE sequence.  Cerebral volume is normal for age. New from the prior brain MRI of 03/02/2021, there is subtle cortically-based restricted diffusion within the medial right occipital lobe spanning approximately 2.8 cm (for instance as seen on series 4, image 10). Findings are compatible with acute infarction. Minimal multifocal T2 FLAIR hyperintense signal abnormality within the cerebral white matter, nonspecific but compatible with chronic small vessel ischemic disease. 3 mm round T2 hyperintense focus within the midline pons, which may reflect a prominent perivascular space or chronic lacunar infarct (series 6, image 6). No evidence of an intracranial mass. No appreciable intracranial hemorrhage. No extra-axial fluid collection. No midline shift. Vascular: Maintained flow voids within the proximal large arterial vessels. Skull and upper cervical spine: No focal suspicious marrow lesion is identified. Sinuses/Orbits: Visualized orbits show no acute finding. No significant paranasal sinus disease. Other: Bilateral mastoid  effusions. IMPRESSION: Motion degraded examination, as described. New from the prior brain MRI of 03/02/2021, there is subtle restricted diffusion within the medial right occipital lobe, spanning approximately 2.8 cm, compatible with acute infarction (right PCA vascular territory). Minimal chronic small-vessel ischemic changes within the cerebral white matter. 3 mm prominent perivascular space versus chronic lacunar infarct within the midline pons. Bilateral mastoid effusions. Electronically Signed   By: Kellie Simmering D.O.   On: 03/04/2021 17:14   EEG adult  Result Date: 03/05/2021 Lora Havens, MD     03/05/2021  3:34 PM Patient Name: CAGE GUPTON MRN: [id number] Epilepsy Attending: Lora Havens Referring Physician/Provider: Dr Rosalin Hawking Date: 03/05/2021 Duration: 21.45 mins Patient history: 65yo M with right PCA stroke and visual hallucinations. EEG to evaluate for seizure. Level of alertness:  Awake, asleep AEDs during EEG study: LEV Technical aspects: This EEG study was done with scalp electrodes positioned according to the 10-20 International system of electrode placement. Electrical activity was acquired at a sampling rate of 500Hz  and reviewed with a high frequency filter of 70Hz  and a low frequency filter of 1Hz . EEG data were recorded continuously and digitally stored. Description: No clear posterior dominant rhythm consists of 8 Hz activity of moderate voltage (25-35 uV) seen predominantly in posterior head regions, symmetric and reactive to eye opening and eye closing. Sleep was characterized by vertex waves, sleep spindles (12 to 14 Hz), maximal frontocentral region. Three seizure without clinical signs were noted on 03/05/2021 at 1431, 1436 and 1447 arising from right temporo-parietal region. Average duration 30 seconds. During seizure, eeg showed 5-6Hz  theta slowing in right temporo-parietal region which then involved all of right frontotemporal region as well as left frontal region and evolved into 3-4hz  delta slowing. Hyperventilation and photic stimulation were not performed.   ABNORMALITY - Seizure without clinical signs, right temporo-parietal region. IMPRESSION: This study showed three seizure without clinical signs on 03/05/2021 at 1431, 1436 and 1447 arising from right temporo-parietal region. Average duration 30 seconds. Dr Erlinda Hong was notified. Lora Havens   ECHOCARDIOGRAM COMPLETE BUBBLE STUDY  Result Date: 03/05/2021    ECHOCARDIOGRAM REPORT   Patient Name:   ERICBERTO PADGET Date of Exam: 03/05/2021 Medical Rec #:  [id number]     Height:       68.0 in Accession #:    [id number]    Weight:       190.0 lb Date of Birth:  [birth year]      BSA:          2.000 m Patient Age:    83 years      BP:           142/73 mmHg Patient Gender: M             HR:           79 bpm. Exam Location:  Inpatient Procedure: 2D Echo and Saline Contrast Bubble Study Indications:    Stroke I63.9  History:         Patient has no prior history of Echocardiogram examinations.                 Risk Factors:Hypertension, Dyslipidemia and Diabetes.  Sonographer:    Mikki Santee RDCS Referring Phys: [place]  1. Left ventricular ejection fraction, by estimation, is 70 to 75%. The left ventricle has hyperdynamic function. The left ventricle has no regional wall motion abnormalities. There is mild left ventricular hypertrophy. Left ventricular diastolic parameters were  normal.  2. Right ventricular systolic function is normal. The right ventricular size is normal.  3. The mitral valve is normal in structure. Trivial mitral valve regurgitation. No evidence of mitral stenosis.  4. The aortic valve is tricuspid. Aortic valve regurgitation is mild. No aortic stenosis is present.  5. The inferior vena cava is normal in size with greater than 50% respiratory variability, suggesting right atrial pressure of 3 mmHg.  6. Agitated saline contrast bubble study was negative, with no evidence of any interatrial shunt. FINDINGS  Left Ventricle: Left ventricular ejection fraction, by estimation, is 70 to 75%. The left ventricle has hyperdynamic function. The left ventricle has no regional wall motion abnormalities. The left ventricular internal cavity size was small. There is mild left ventricular hypertrophy. Left ventricular diastolic parameters were normal. Right Ventricle: The right ventricular size is normal. No increase in right ventricular wall thickness. Right ventricular systolic function is normal. Left Atrium: Left atrial size was normal in size. Right Atrium: Right atrial size was normal in size. Pericardium: There is no evidence of pericardial effusion. Mitral Valve: The mitral valve is normal in structure. Trivial mitral valve regurgitation. No evidence of mitral valve stenosis. Tricuspid Valve: The tricuspid valve is normal in structure. Tricuspid valve regurgitation is trivial. Aortic Valve: The  aortic valve is tricuspid. Aortic valve regurgitation is mild. Aortic regurgitation PHT measures 611 msec. No aortic stenosis is present. Pulmonic Valve: The pulmonic valve was not well visualized. Pulmonic valve regurgitation is not visualized. Aorta: The aortic root and ascending aorta are structurally normal, with no evidence of dilitation. Venous: The inferior vena cava is normal in size with greater than 50% respiratory variability, suggesting right atrial pressure of 3 mmHg. IAS/Shunts: The interatrial septum was not well visualized. Agitated saline contrast was given intravenously to evaluate for intracardiac shunting. Agitated saline contrast bubble study was negative, with no evidence of any interatrial shunt.  LEFT VENTRICLE PLAX 2D LVIDd:         3.70 cm   Diastology LVIDs:         2.50 cm   LV e' medial:    8.05 cm/s LV PW:         1.30 cm   LV E/e' medial:  9.6 LV IVS:        1.30 cm   LV e' lateral:   16.10 cm/s LVOT diam:     1.90 cm   LV E/e' lateral: 4.8 LV SV:         73 LV SV Index:   37 LVOT Area:     2.84 cm  RIGHT VENTRICLE RV S prime:     18.60 cm/s TAPSE (M-mode): 2.0 cm LEFT ATRIUM             Index        RIGHT ATRIUM           Index LA diam:        2.80 cm 1.40 cm/m   RA Area:     12.80 cm LA Vol (A2C):   32.6 ml 16.30 ml/m  RA Volume:   26.70 ml  13.35 ml/m LA Vol (A4C):   31.1 ml 15.55 ml/m LA Biplane Vol: 34.1 ml 17.05 ml/m  AORTIC VALVE LVOT Vmax:   155.00 cm/s LVOT Vmean:  101.000 cm/s LVOT VTI:    0.258 m AI PHT:      611 msec  AORTA Ao Root diam: 3.80 cm MITRAL VALVE MV Area (PHT): 3.74 cm  SHUNTS MV Decel Time: 203 msec    Systemic VTI:  0.26 m MV E velocity: 77.00 cm/s  Systemic Diam: 1.90 cm MV A velocity: 95.90 cm/s MV E/A ratio:  0.80 Oswaldo Milian MD Electronically signed by Oswaldo Milian MD Signature Date/Time: 03/05/2021/1:04:14 PM    Final    VAS [place] LOWER EXTREMITY VENOUS (DVT)  Result Date: 03/05/2021  Lower Venous DVT Study Patient Name:   SHERIDAN GETTEL  Date of Exam:   03/05/2021 Medical Rec #: [id number]      Accession #:    [id number] Date of Birth: 11/08/1955       Patient Gender: M Patient Age:   62 years Exam Location:  [facility] Procedure:      VAS [place] LOWER EXTREMITY VENOUS (DVT) Referring Phys: Cornelius Moras XU --------------------------------------------------------------------------------  Indications: Stroke.  Comparison Study: No prior study on file Performing Technologist: Sharion Dove RVS  Examination Guidelines: A complete evaluation includes B-mode imaging, spectral Doppler, color Doppler, and power Doppler as needed of all accessible portions of each vessel. Bilateral testing is considered an integral part of a complete examination. Limited examinations for reoccurring indications may be performed as noted. The reflux portion of the exam is performed with the patient in reverse Trendelenburg.  +---------+---------------+---------+-----------+----------+--------------+ RIGHT    CompressibilityPhasicitySpontaneityPropertiesThrombus Aging +---------+---------------+---------+-----------+----------+--------------+ CFV      Full           Yes      Yes                                 +---------+---------------+---------+-----------+----------+--------------+ SFJ      Full                                                        +---------+---------------+---------+-----------+----------+--------------+ FV Prox  Full                                                        +---------+---------------+---------+-----------+----------+--------------+ FV Mid   Full                                                        +---------+---------------+---------+-----------+----------+--------------+ FV DistalFull                                                        +---------+---------------+---------+-----------+----------+--------------+ PFV      Full                                                         +---------+---------------+---------+-----------+----------+--------------+ POP      Full  Yes      Yes                                 +---------+---------------+---------+-----------+----------+--------------+ PTV      Full                                                        +---------+---------------+---------+-----------+----------+--------------+ PERO     Full                                                        +---------+---------------+---------+-----------+----------+--------------+   +---------+---------------+---------+-----------+----------+--------------+ LEFT     CompressibilityPhasicitySpontaneityPropertiesThrombus Aging +---------+---------------+---------+-----------+----------+--------------+ CFV      Full           Yes      Yes                                 +---------+---------------+---------+-----------+----------+--------------+ SFJ      Full                                                        +---------+---------------+---------+-----------+----------+--------------+ FV Prox  Full                                                        +---------+---------------+---------+-----------+----------+--------------+ FV Mid   Full                                                        +---------+---------------+---------+-----------+----------+--------------+ FV DistalFull                                                        +---------+---------------+---------+-----------+----------+--------------+ PFV      Full                                                        +---------+---------------+---------+-----------+----------+--------------+ POP      Full           Yes      Yes                                 +---------+---------------+---------+-----------+----------+--------------+ PTV  Full                                                         +---------+---------------+---------+-----------+----------+--------------+ PERO     Full                                                        +---------+---------------+---------+-----------+----------+--------------+    Summary: BILATERAL: - No evidence of deep vein thrombosis seen in the lower extremities, bilaterally. -No evidence of popliteal cyst, bilaterally.   *See table(s) above for measurements and observations.    Preliminary        LOS: 1 day   Kariya Lavergne [employer] on [email or web address]  03/06/2021, 8:43 AM

## 2021-03-06 NOTE — Evaluation (Signed)
Occupational Therapy Evaluation Patient Details Name: UZZIAH RIGG MRN: [id number] DOB: 11/20/1955 Today's Date: 03/06/2021   History of Present Illness 65 y.o. male who presented to the ED for persistent headache and change in vision on 11/24, was discharged home and returned wtih persistent visual deficits. MRI (+) for R occipital stroke, EEG (+) for seizures. Pt with medical history significant for type 2 diabetes, HLD, CKD stage IIIa, prostate cancer   Clinical Impression   Perez was indep PTA, including driving, IADLs and ambulating without an AD. He lives in a mobile home with his wife who is able to provide 24/7 support at d/c if needed. Pt is now limited by lethargy, generalized weakness, and L visual field deficits. Pt was close min guard for simple transfers this session without AD however was limited by EEG and lethargy; session was limited. He also requires up to min A for ADLs, and significantly increased time for all tasks assessed. Pt will beenfit from OT acutely. Recommend pt d/c home with neuro OP OT to further rehab L visual impairments     Recommendations for follow up therapy are one component of a multi-disciplinary discharge planning process, led by the attending physician.  Recommendations may be updated based on patient status, additional functional criteria and insurance authorization.   Follow Up Recommendations  Outpatient OT    Assistance Recommended at Discharge Intermittent Supervision/Assistance  Functional Status Assessment  Patient has had a recent decline in their functional status and demonstrates the ability to make significant improvements in function in a reasonable and predictable amount of time.  Equipment Recommendations  BSC/3in1       Precautions / Restrictions Precautions Precautions: Fall Restrictions Weight Bearing Restrictions: No      Mobility Bed Mobility Overal bed mobility: Needs Assistance Bed Mobility: Sit to Supine;Supine to  Sit     Supine to sit: Min guard Sit to supine: Min guard   General bed mobility comments: no physical assist needed. verbal cues for lines    Transfers Overall transfer level: Needs assistance   Transfers: Sit to/from Stand Sit to Stand: Min guard           General transfer comment: no physical assist needed. incrased time      Balance Overall balance assessment: Needs assistance Sitting-balance support: Feet supported Sitting balance-Leahy Scale: Fair     Standing balance support: No upper extremity supported Standing balance-Leahy Scale: Fair Standing balance comment: for static stand                           ADL either performed or assessed with clinical judgement   ADL Overall ADL's : Needs assistance/impaired Eating/Feeding: Independent;Sitting   Grooming: Wash/dry face;Oral care;Wash/dry hands;Standing   Upper Body Bathing: Set up;Sitting   Lower Body Bathing: Sit to/from stand;Minimal assistance   Upper Body Dressing : Minimal assistance;Sitting   Lower Body Dressing: Minimal assistance;Sit to/from stand   Toilet Transfer: Min guard;Ambulation   Toileting- Clothing Manipulation and Hygiene: Minimal assistance;Sit to/from stand       Functional mobility during ADLs: Min guard General ADL Comments: assistance due to decrased level of arousal, safety and balance. Pt slow and deliberate for all tasks assessed     Vision Baseline Vision/History: 1 Wears glasses Patient Visual Report: Peripheral vision impairment Vision Assessment?: Yes Eye Alignment: Within Functional Limits Ocular Range of Motion: Within Functional Limits Alignment/Gaze Preference: Within Defined Limits Tracking/Visual Pursuits: Able to track stimulus in all quads  without difficulty Convergence: Within functional limits Visual Fields: Left homonymous hemianopsia;Impaired-to be further tested in functional context Additional Comments: Limited visual assessment due to  pt level of arousal and pt without his galsses            Pertinent Vitals/Pain Pain Assessment: No/denies pain     Hand Dominance Right   Extremity/Trunk Assessment Upper Extremity Assessment Upper Extremity Assessment: Overall WFL for tasks assessed   Lower Extremity Assessment Lower Extremity Assessment: Defer to PT evaluation   Cervical / Trunk Assessment Cervical / Trunk Assessment: Normal   Communication Communication Communication: No difficulties   Cognition Arousal/Alertness: Lethargic Behavior During Therapy: Flat affect;WFL for tasks assessed/performed Overall Cognitive Status: Impaired/Different from baseline Area of Impairment: Attention;Following commands;Safety/judgement;Awareness                   Current Attention Level: Selective   Following Commands: Follows one step commands with increased time Safety/Judgement: Decreased awareness of safety;Decreased awareness of deficits Awareness: Emergent   General Comments: Ox4, lethargive this session and required cues to stay awake. Pt slow and deliberate for all tasks assessed     General Comments  VSS on RA, EEG intact, family in room and supportive    Exercises     Shoulder Instructions      Home Living Family/patient expects to be discharged to:: Private residence Living Arrangements: Spouse/significant other Available Help at Discharge: Family;Available 24 hours/day Type of Home: Mobile home Home Access: Stairs to enter;Level entry Entrance Stairs-Number of Steps: 3-4 (steps for front enterance, level entry in the back)   Home Layout: One level     Bathroom Shower/Tub: Corporate investment banker: Standard     Home Equipment: None      Lives With: Spouse    Prior Functioning/Environment Prior Level of Function : Independent/Modified Independent             Mobility Comments: no AD ADLs Comments: drives, retired        Secretary/administrator Problem List: Decreased  strength;Decreased range of motion;Impaired balance (sitting and/or standing);Impaired vision/perception;Decreased activity tolerance;Decreased safety awareness;Decreased cognition;Decreased knowledge of use of DME or AE;Pain      OT Treatment/Interventions:      OT Goals(Current goals can be found in the care plan section) Acute Rehab OT Goals Patient Stated Goal: get some rest OT Goal Formulation: With patient Time For Goal Achievement: 03/20/21 Potential to Achieve Goals: Fair ADL Goals Pt Will Perform Grooming: with min guard assist;standing Pt Will Perform Lower Body Bathing: with set-up;sit to/from stand Pt Will Perform Upper Body Dressing: with set-up;sitting Pt Will Perform Tub/Shower Transfer: with min guard assist;ambulating Additional ADL Goal #1: Pt will locate and obtain at least 3 items on L side to complete ADL task  OT Frequency:     Barriers to D/C:            Co-evaluation              AM-PAC OT "6 Clicks" Daily Activity     Outcome Measure Help from another person eating meals?: None Help from another person taking care of personal grooming?: A Little Help from another person toileting, which includes using toliet, bedpan, or urinal?: A Little Help from another person bathing (including washing, rinsing, drying)?: A Little Help from another person to put on and taking off regular upper body clothing?: A Little Help from another person to put on and taking off regular lower body clothing?: None 6 Click Score: 20  End of Session Nurse Communication: Mobility status  Activity Tolerance: Patient tolerated treatment well Patient left: in bed;with chair alarm set;with call bell/phone within reach  OT Visit Diagnosis: Other abnormalities of gait and mobility (R26.89);Muscle weakness (generalized) (M62.81);Pain                Time: 1950-9326 OT Time Calculation (min): 20 min Charges:  OT General Charges $OT Visit: 1 Visit OT Evaluation $OT Eval Moderate  Complexity: 1 Mod   Kristen Bushway A Sayvon Arterberry 03/06/2021, 11:30 AM

## 2021-03-07 ENCOUNTER — Encounter (HOSPITAL_COMMUNITY)
Admission: EM | Disposition: A | Discharge status: Home / Self Care | Payer: Self-pay | Source: Home / Self Care | Attending: Internal Medicine

## 2021-03-07 ENCOUNTER — Inpatient Hospital Stay (HOSPITAL_COMMUNITY): Payer: Medicare Other

## 2021-03-07 DIAGNOSIS — G40919 Epilepsy, unspecified, intractable, without status epilepticus: Secondary | ICD-10-CM

## 2021-03-07 DIAGNOSIS — R441 Visual hallucinations: Secondary | ICD-10-CM | POA: Diagnosis not present

## 2021-03-07 DIAGNOSIS — I639 Cerebral infarction, unspecified: Secondary | ICD-10-CM | POA: Diagnosis not present

## 2021-03-07 DIAGNOSIS — N1831 Chronic kidney disease, stage 3a: Secondary | ICD-10-CM | POA: Diagnosis not present

## 2021-03-07 DIAGNOSIS — E1122 Type 2 diabetes mellitus with diabetic chronic kidney disease: Secondary | ICD-10-CM | POA: Diagnosis not present

## 2021-03-07 DIAGNOSIS — R569 Unspecified convulsions: Secondary | ICD-10-CM | POA: Diagnosis not present

## 2021-03-07 LAB — HEPATIC FUNCTION PANEL
ALT: 17 U/L (ref 0–44)
AST: 15 U/L (ref 15–41)
Albumin: 3.7 g/dL (ref 3.5–5.0)
Alkaline Phosphatase: 83 U/L (ref 38–126)
Bilirubin, Direct: 0.1 mg/dL (ref 0.0–0.2)
Indirect Bilirubin: 0.8 mg/dL (ref 0.3–0.9)
Total Bilirubin: 0.9 mg/dL (ref 0.3–1.2)
Total Protein: 6.1 g/dL — ABNORMAL LOW (ref 6.5–8.1)

## 2021-03-07 LAB — BASIC METABOLIC PANEL
Anion gap: 12 (ref 5–15)
BUN: 22 mg/dL (ref 8–23)
CO2: 18 mmol/L — ABNORMAL LOW (ref 22–32)
Calcium: 9.7 mg/dL (ref 8.9–10.3)
Chloride: 104 mmol/L (ref 98–111)
Creatinine, Ser: 1.57 mg/dL — ABNORMAL HIGH (ref 0.61–1.24)
GFR, Estimated: 49 mL/min — ABNORMAL LOW (ref >60)
Glucose, Bld: 227 mg/dL — ABNORMAL HIGH (ref 70–99)
Potassium: 4.2 mmol/L (ref 3.5–5.1)
Sodium: 134 mmol/L — ABNORMAL LOW (ref 135–145)

## 2021-03-07 LAB — PHENYTOIN LEVEL, TOTAL: Phenytoin Lvl: 19 ug/mL (ref 10.0–20.0)

## 2021-03-07 LAB — GLUCOSE, CAPILLARY
Glucose-Capillary: 313 mg/dL — ABNORMAL HIGH (ref 70–99)
Glucose-Capillary: 259 mg/dL — ABNORMAL HIGH (ref 70–99)
Glucose-Capillary: 158 mg/dL — ABNORMAL HIGH (ref 70–99)
Glucose-Capillary: 125 mg/dL — ABNORMAL HIGH (ref 70–99)

## 2021-03-07 IMAGING — MR MR HEAD W/O CM
10 of 12 series · 26 of 48 positions shown · non-contrast
Comparison: Head MRI [DATE]

CLINICAL DATA: Chronic seizures. History of trauma. EEG with
evidence of seizures arising from the right temporoparietal region.

EXAM:
MRI HEAD WITHOUT CONTRAST
TECHNIQUE: Multiplanar, multiecho pulse sequences of the brain and surrounding
structures were obtained without intravenous contrast.

[Series 2: DWI · axial · 3.0mm · 0.94mm/px · z∈[-90,+63]mm · 6 of 104 slices shown (1 of 2)]
[im 1/104]
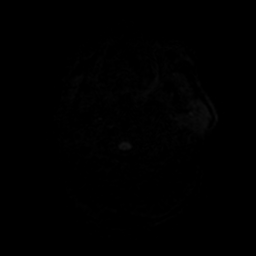
[im 21/104]
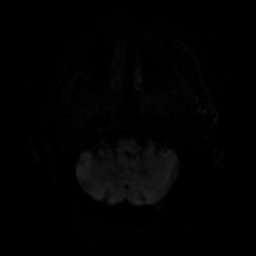
[im 42/104]
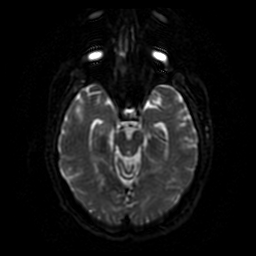
[im 62/104]
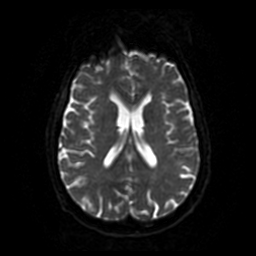
[im 83/104]
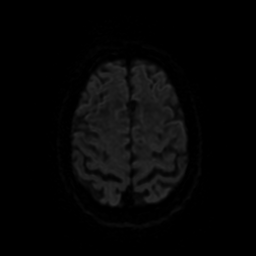
[im 104/104]
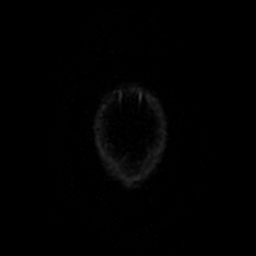

[Series 3: DWI · coronal · 4.0mm · 0.94mm/px · 4 of 73 slices shown (2 of 2)]
[im 1/73]
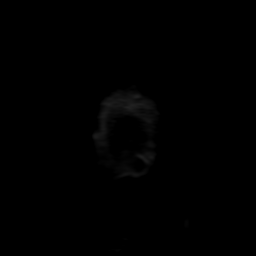
[im 25/73]
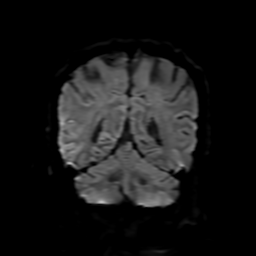
[im 49/73]
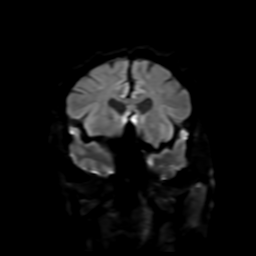
[im 73/73]
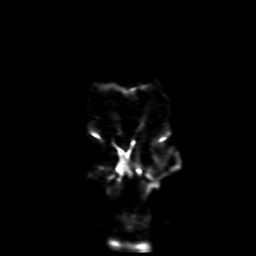

[Series 4: FLAIR · sagittal · 5.0mm · 0.23mm/px · 1 of 25 slices shown (1 of 2)]
[im 1/25]
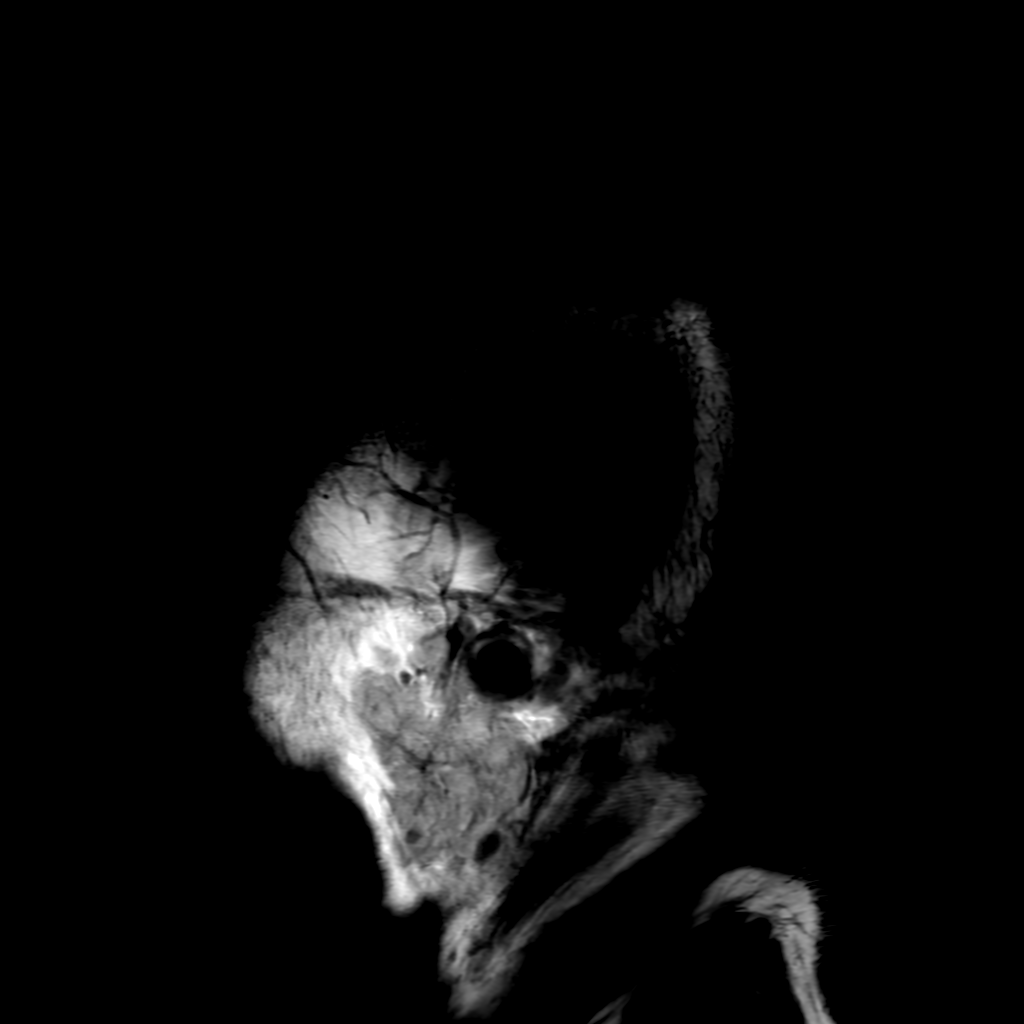

[Series 5: T2 · axial · 5.0mm · 0.47mm/px · 1 of 27 slices shown (1 of 2)]
[im 1/27]
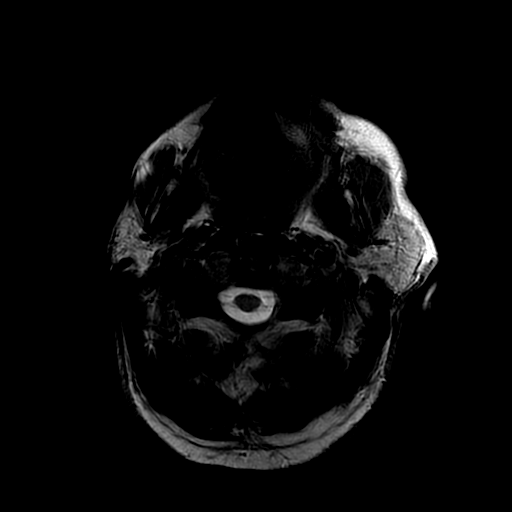

[Series 6: FLAIR · axial · 5.0mm · 0.47mm/px · 1 of 27 slices shown (2 of 2)]
[im 1/27]
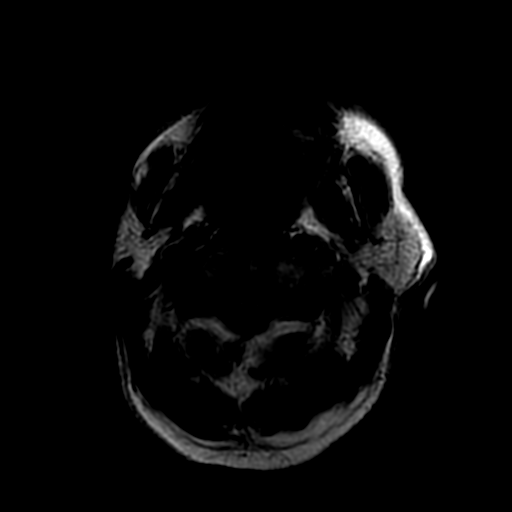

[Series 7: (person_name) · axial · 3.0mm · 0.47mm/px · z∈[-91,+36]mm · 5 of 108 slices shown]
[im 1/108]
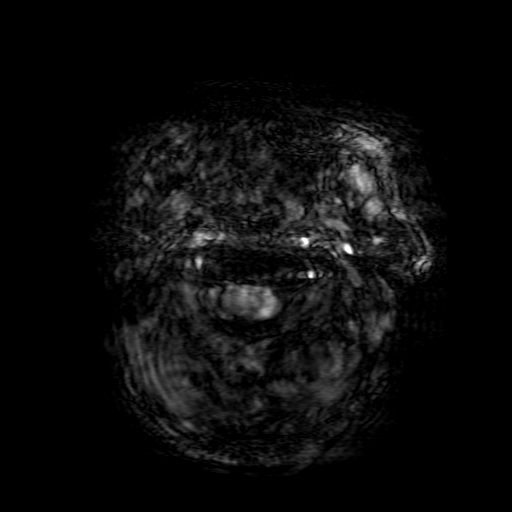
[im 22/108]
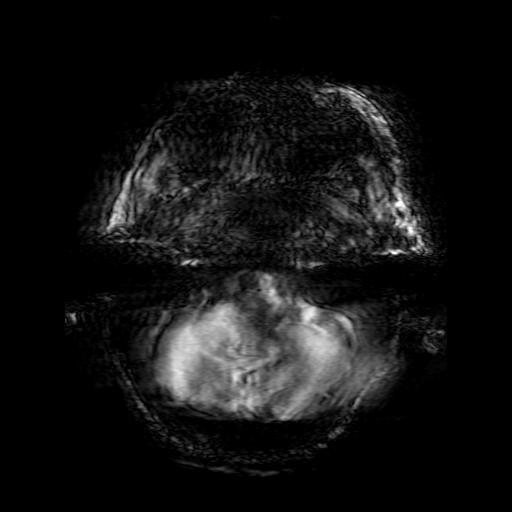
[im 43/108]
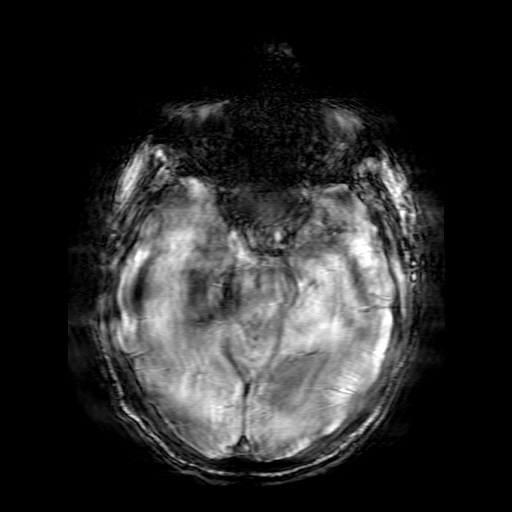
[im 65/108]
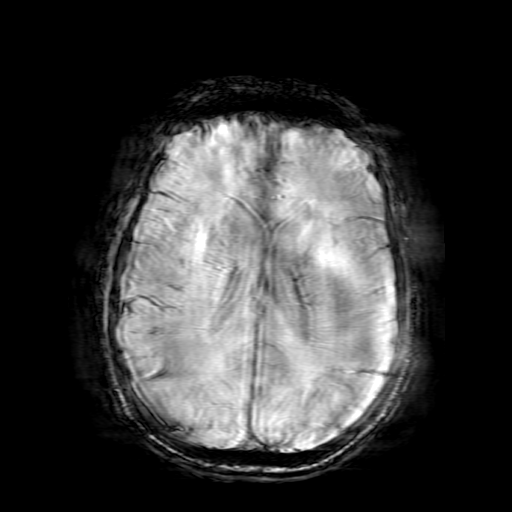
[im 86/108]
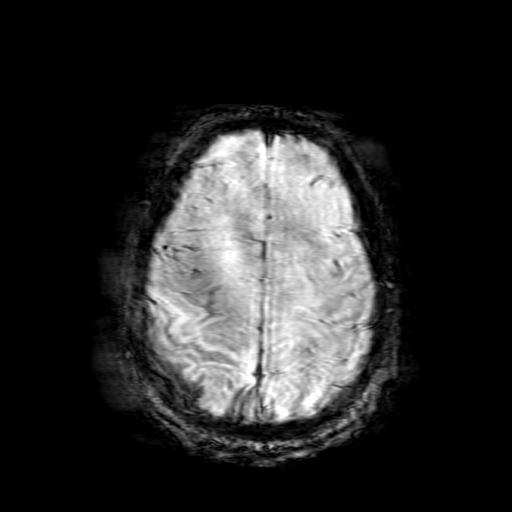

[Series 9: GRE · axial · 5.0mm · 0.47mm/px · 1 of 29 slices shown]
[im 1/29]
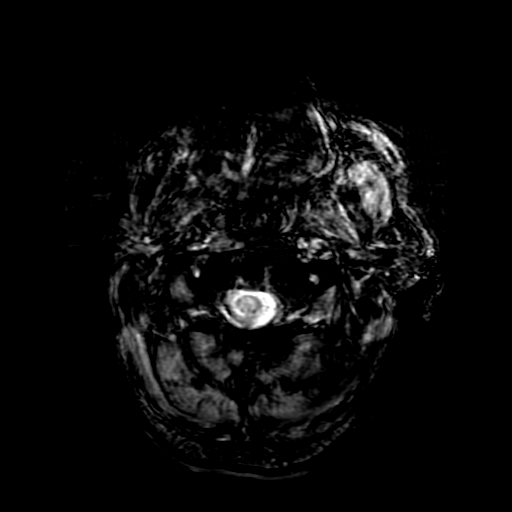

[Series 10: T2 · coronal · 3.0mm · 0.39mm/px · 2 of 40 slices shown (2 of 2)]
[im 1/40]
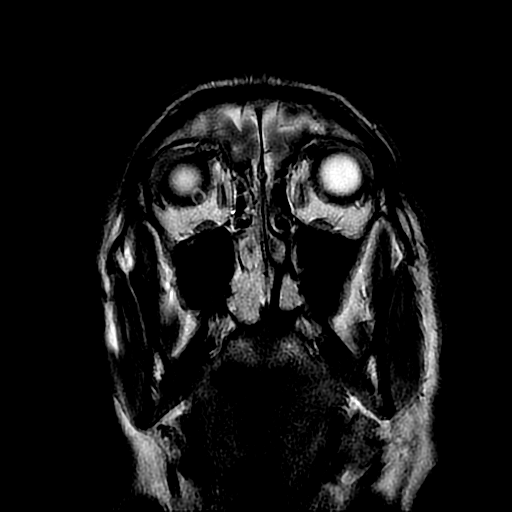
[im 40/40]
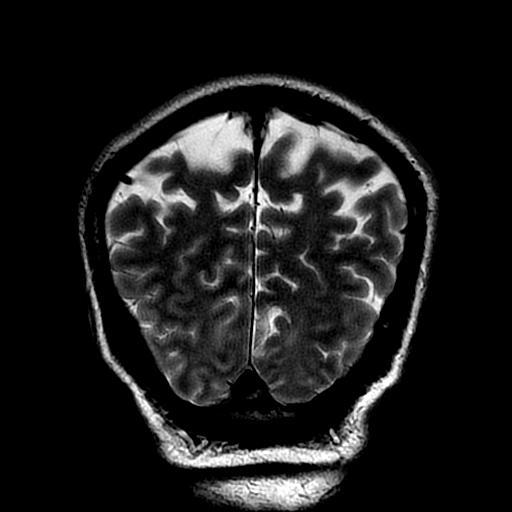

[Series 250: ADC · axial · 3.0mm · 0.94mm/px · z∈[-90,+63]mm · 3 of 52 slices shown (1 of 2)]
[im 1/52]
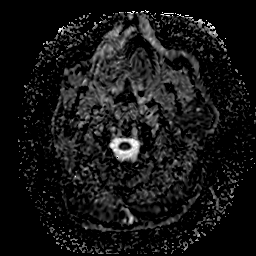
[im 26/52]
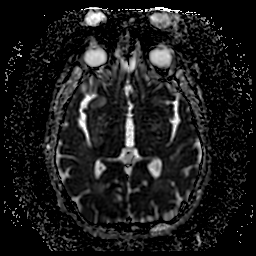
[im 52/52]
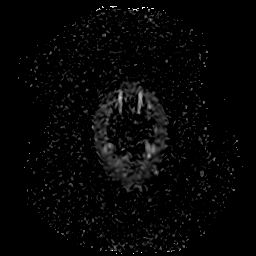

[Series 350: ADC · coronal · 4.0mm · 0.94mm/px · 2 of 33 slices shown (2 of 2)]
[im 1/33]
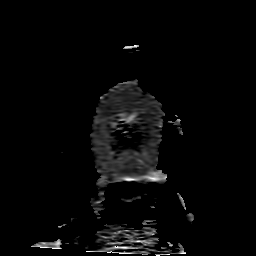
[im 33/33]
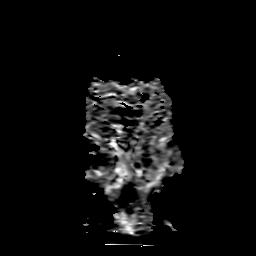

[26 of 48 positions shown; findings below may reference images not displayed]

FINDINGS: Despite being premedicated, the patient could not tolerate the
complete examination. A complete noncontrast seizure protocol MRI
was obtained with the exception of thin section coronal FLAIR
imaging. IV contrast was not administered. Multiple sequences are
mildly to moderately motion degraded.

Brain: There is persistent cortically based trace diffusion weighted
signal hyperintensity in the medial right occipital lobe, and there
is now also asymmetric diffusion weighted signal and subtle T2
hyperintensity in the mesial right temporal lobe, particularly the
hippocampus. More subtle cortical diffusion abnormality is
questioned elsewhere in the right temporal, occipital, and parietal
lobes although this may be artifactual due to motion. There is also
subcortical white matter T2 hypointensity in the medial right
occipital and right temporal lobes.

The ventricles and sulci are normal. No intracranial hemorrhage,
mass, midline shift, or extra-axial fluid collection is identified.
A few small T2 hyperintensities in the cerebral white matter
bilaterally elsewhere are unchanged and nonspecific but compatible
with minimal chronic small vessel ischemic disease. A dilated
perivascular space or a punctate chronic infarct in the midline of
the pons is unchanged.

Vascular: Major intracranial vascular flow voids are preserved.

Skull and upper cervical spine: Unremarkable bone marrow signal.

Sinuses/Orbits: Unremarkable orbits. Clear paranasal sinuses. Trace
bilateral mastoid effusions.

Other: None.
IMPRESSION: 1. Motion degraded, incomplete examination.
2. Abnormal cortically based diffusion signal in the occipital and
temporal lobes, greater in extent than on the recent prior MRI and
favored to reflect the sequelae of recent seizure activity although
a component of acute ischemia is also possible. Subcortical T2
hypointensity in these regions is of indeterminate etiology but has
been reported with nonketotic hyperglycemia and occipital seizures
as well as ischemia and encephalitis.

## 2021-03-07 SURGERY — ECHOCARDIOGRAM, TRANSESOPHAGEAL
Anesthesia: Monitor Anesthesia Care

## 2021-03-07 MED ORDER — PHENOBARBITAL 32.4 MG PO TABS: 64.8000 mg | ORAL_TABLET | Freq: Every day | ORAL | Status: DC

## 2021-03-07 MED ORDER — SODIUM CHLORIDE 0.9 % IV SOLN
500.0000 mg | Freq: Once | INTRAVENOUS | Status: AC
  Administered 2021-03-07: 500 mg via INTRAVENOUS
  Filled 2021-03-07: qty 3.85

## 2021-03-07 NOTE — Progress Notes (Signed)
20 mg of phenobarbital wasted in the Main pharmacy's Stericy[facility] container. Anette Guarneri, PharmD, was witness.

## 2021-03-07 NOTE — Progress Notes (Signed)
Maintenance was performed. Patient had wires off. Patient starting to show possible mild skin breakdown.

## 2021-03-07 NOTE — Progress Notes (Signed)
EEG electrodes removed for MRI - will hook patient back up to EEG when he returns.

## 2021-03-07 NOTE — Progress Notes (Addendum)
STROKE TEAM PROGRESS NOTE   ATTENDING NOTE: I reviewed above note and agree with the assessment and plan. Pt was seen and examined.   Husband and son at bedside.  Patient lying in bed, still has long-term EEG.  Per wife, patient at night has sundowning and delirium with agitation and pulled off leads early this morning.  Per Dr. Hortense Ramal, pt had several seizures on EEG before leads pulled off, still coming from right temporoparietal region.  Recommend to add phenobarbital 500 IV load followed by 65 mg daily.  On exam, patient lethargic, however, eyes open to voice, fully orientated.  Still has left hemianopia however able to intermittently currently finger sound left visual field, improved from yesterday.  Moving all extremities, no other focal deficit.  Etiology for patient's seizure not quite clear, could be related to uncontrolled diabetes and hyperglycemia, however patient glucose not super high at this time.  Wife told me today that patient for the last 1 to 2 months has intermittent episodes of confusion, speech difficulty and not responding but lasting briefly.  That could be seizure activity to which means patient seizure has been for some time.  It is unusual for stroke induced seizure with such intractable pattern.  So we repeated MRI with and without contrast for further evaluation.  However, patient still not able to tolerating contrast MRI even after phenobarbital load.  MRI without contrast, however, showed bilateral occipital and temporal increased DWI signal, concerning for sequelae from seizure.  No significant stroke seen this time.  Will likely need LP for further evaluation to rule out encephalitis. Will d/c plavix and hold off heparin subq today in preparation of LP.   For detailed assessment and plan, please refer to above as I have made changes wherever appropriate.   Rosalin Hawking, MD PhD Stroke Neurology 03/07/2021 5:57 PM  I had long discussion with wife at bedside, updated pt  current condition, treatment plan and potential prognosis, and answered all the questions. She expressed understanding and appreciation. I discussed with Dr. Hortense Ramal. I spent  35 minutes in total face-to-face time with the patient, more than 50% of which was spent in counseling and coordination of care, reviewing test results, images and medication, and discussing the diagnosis, treatment plan and potential prognosis. This patient's care requiresreview of multiple databases, neurological assessment, discussion with family, other specialists and medical decision making of high complexity.       INTERVAL HISTORY His wife and son are at the bedside.  Pt lying in bed, drowsy but easily arousable and fully orientated. Still on LTM EEG. In spite of multiple Aed patient had at least 8 seizures overnight    Pt denies any more bright lights and visual hallucination. Not explained by stroke. Will obtain repeat MRI brain with and without contrast to assess for other possible sources.   OBJECTIVE Vitals:   03/06/21 2010 03/06/21 2353 03/07/21 0445 03/07/21 0832  BP: 118/88 (!) 141/80 126/79 128/80  Pulse: 77 79 80 (!) 101  Resp:   16 20  Temp: 98.2 F (36.8 C) 98.4 F (36.9 C) 98.2 F (36.8 C) 97.6 F (36.4 C)  TempSrc: Oral Oral Axillary Oral  SpO2: 99% 95% 100% 98%  Weight:      Height:        CBC:  Recent Labs  Lab 03/02/21 1616 03/04/21 1223 03/05/21 0216 03/06/21 0156  WBC 7.6 8.7 6.9 5.4  NEUTROABS 6.1 6.7  --   --   HGB 14.8 16.3 14.3 15.1  HCT 43.6 49.1 41.4 44.1  MCV 87.4 89.3 84.8 84.3  PLT 212 238 221 016    Basic Metabolic Panel:  Recent Labs  Lab 03/06/21 0156 03/07/21 0102  NA 136 134*  K 3.5 4.2  CL 108 104  CO2 17* 18*  GLUCOSE 105* 227*  BUN 29* 22  CREATININE 1.54* 1.57*  CALCIUM 9.5 9.7    Lipid Panel:     Component Value Date/Time   CHOL 152 03/04/2021 1322   TRIG 181 (H) 03/04/2021 1322   HDL 39 (L) 03/04/2021 1322   CHOLHDL 3.9 03/04/2021 1322    VLDL 36 03/04/2021 1322   LDLCALC 77 03/04/2021 1322   HgbA1c:  Lab Results  Component Value Date   HGBA1C 13.5 (H) 03/04/2021   Urine Drug Screen:     Component Value Date/Time   LABOPIA NONE DETECTED 03/05/2021 0600   COCAINSCRNUR NONE DETECTED 03/05/2021 0600   LABBENZ NONE DETECTED 03/05/2021 0600   AMPHETMU NONE DETECTED 03/05/2021 0600   THCU NONE DETECTED 03/05/2021 0600   LABBARB NONE DETECTED 03/05/2021 0600    Alcohol Level No results found for: [facility]  IMAGING  EEG adult  Result Date: 03/05/2021 Lora Havens, MD     03/05/2021  3:34 PM Patient Name: INES WARF MRN: [id number] Epilepsy Attending: Lora Havens Referring Physician/Provider: Dr Rosalin Hawking Date: 03/05/2021 Duration: 21.45 mins Patient history: 65yo M with right PCA stroke and visual hallucinations. EEG to evaluate for seizure. Level of alertness: Awake, asleep AEDs during EEG study: LEV Technical aspects: This EEG study was done with scalp electrodes positioned according to the 10-20 International system of electrode placement. Electrical activity was acquired at a sampling rate of 500Hz  and reviewed with a high frequency filter of 70Hz  and a low frequency filter of 1Hz . EEG data were recorded continuously and digitally stored. Description: No clear posterior dominant rhythm consists of 8 Hz activity of moderate voltage (25-35 uV) seen predominantly in posterior head regions, symmetric and reactive to eye opening and eye closing. Sleep was characterized by vertex waves, sleep spindles (12 to 14 Hz), maximal frontocentral region. Three seizure without clinical signs were noted on 03/05/2021 at 1431, 1436 and 1447 arising from right temporo-parietal region. Average duration 30 seconds. During seizure, eeg showed 5-6Hz  theta slowing in right temporo-parietal region which then involved all of right frontotemporal region as well as left frontal region and evolved into 3-4hz  delta slowing. Hyperventilation and photic  stimulation were not performed.   ABNORMALITY - Seizure without clinical signs, right temporo-parietal region. IMPRESSION: This study showed three seizure without clinical signs on 03/05/2021 at 1431, 1436 and 1447 arising from right temporo-parietal region. Average duration 30 seconds. Dr Erlinda Hong was notified. Priyanka [name]   Overnight EEG with video  Result Date: 03/06/2021 Lora Havens, MD     03/07/2021  8:56 AM Patient Name: PARRIS CUDWORTH MRN: [id number] Epilepsy Attending: Lora Havens Referring Physician/Provider: Dr Rosalin Hawking Duration: 03/05/2021 1746 to11/28/2022 1746  Patient history: 65yo M with right PCA stroke and visual hallucinations. EEG to evaluate for seizure.  Level of alertness: Awake, asleep  AEDs during EEG study: PHT, Clonazepam  Technical aspects: This EEG study was done with scalp electrodes positioned according to the 10-20 International system of electrode placement. Electrical activity was acquired at a sampling rate of 500Hz  and reviewed with a high frequency filter of 70Hz  and a low frequency filter of 1Hz . EEG data were recorded continuously and digitally stored.  Description:  No clear posterior dominant rhythm consists of 8 Hz activity of moderate voltage (25-35 uV) seen predominantly in posterior head regions, symmetric and reactive to eye opening and eye closing. Sleep was characterized by vertex waves, sleep spindles (12 to 14 Hz), maximal frontocentral region.  22 seizure without clinical signs were noted during this study arising from right temporo-parietal region. Average duration 30 seconds. Last seizure was noted on 03/06/2021 at 1537. During seizure, eeg showed 5-6Hz  theta slowing in right temporo-parietal region which then involved all of right frontotemporal region as well as left frontal region and evolved into 3-4hz  delta slowing. EEG also showed lateralized periodic discharges in right hemisphere, maximal right temporoparietal region at 1 Hz which at times  appeared rhythmic consistent with brief ictal-interictal rhythmic discharges  Hyperventilation and photic stimulation were not performed.    ABNORMALITY - Seizure without clinical signs, right temporo-parietal region. -Brief ictal-interictal rhythmic discharges, right hemisphere, maximal right temporoparietal region -Lateralized paretic discharges,right hemisphere, maximal right temporoparietal region  IMPRESSION: This study showed 22 seizure without clinical signs arising nfrom right temporo-parietal region. Average duration 30 seconds. Last seizure was noted on 03/06/2021 at 1537. Additionally, EEG showed evidence of cortical dysfunction and epileptogenicity arising from right hemisphere, maximal right temporoparietal region likely due to underlying structural abnormality with high potential for seizure recurrence.  Lora Havens   ECHOCARDIOGRAM COMPLETE BUBBLE STUDY  Result Date: 03/05/2021    ECHOCARDIOGRAM REPORT   Patient Name:   OLAMIDE CARATTINI Date of Exam: 03/05/2021 Medical Rec #:  [id number]     Height:       68.0 in Accession #:    [id number]    Weight:       190.0 lb Date of Birth:  Mar 18, 1956      BSA:          2.000 m Patient Age:    48 years      BP:           142/73 mmHg Patient Gender: M             HR:           79 bpm. Exam Location:  Inpatient Procedure: 2D Echo and Saline Contrast Bubble Study Indications:    Stroke I63.9  History:        Patient has no prior history of Echocardiogram examinations.                 Risk Factors:Hypertension, Dyslipidemia and Diabetes.  Sonographer:    Mikki Santee RDCS Referring Phys: [place]  1. Left ventricular ejection fraction, by estimation, is 70 to 75%. The left ventricle has hyperdynamic function. The left ventricle has no regional wall motion abnormalities. There is mild left ventricular hypertrophy. Left ventricular diastolic parameters were normal.  2. Right ventricular systolic function is normal. The right  ventricular size is normal.  3. The mitral valve is normal in structure. Trivial mitral valve regurgitation. No evidence of mitral stenosis.  4. The aortic valve is tricuspid. Aortic valve regurgitation is mild. No aortic stenosis is present.  5. The inferior vena cava is normal in size with greater than 50% respiratory variability, suggesting right atrial pressure of 3 mmHg.  6. Agitated saline contrast bubble study was negative, with no evidence of any interatrial shunt. FINDINGS  Left Ventricle: Left ventricular ejection fraction, by estimation, is 70 to 75%. The left ventricle has hyperdynamic function. The left ventricle has no regional wall motion abnormalities. The left ventricular  internal cavity size was small. There is mild left ventricular hypertrophy. Left ventricular diastolic parameters were normal. Right Ventricle: The right ventricular size is normal. No increase in right ventricular wall thickness. Right ventricular systolic function is normal. Left Atrium: Left atrial size was normal in size. Right Atrium: Right atrial size was normal in size. Pericardium: There is no evidence of pericardial effusion. Mitral Valve: The mitral valve is normal in structure. Trivial mitral valve regurgitation. No evidence of mitral valve stenosis. Tricuspid Valve: The tricuspid valve is normal in structure. Tricuspid valve regurgitation is trivial. Aortic Valve: The aortic valve is tricuspid. Aortic valve regurgitation is mild. Aortic regurgitation PHT measures 611 msec. No aortic stenosis is present. Pulmonic Valve: The pulmonic valve was not well visualized. Pulmonic valve regurgitation is not visualized. Aorta: The aortic root and ascending aorta are structurally normal, with no evidence of dilitation. Venous: The inferior vena cava is normal in size with greater than 50% respiratory variability, suggesting right atrial pressure of 3 mmHg. IAS/Shunts: The interatrial septum was not well visualized. Agitated saline  contrast was given intravenously to evaluate for intracardiac shunting. Agitated saline contrast bubble study was negative, with no evidence of any interatrial shunt.  LEFT VENTRICLE PLAX 2D LVIDd:         3.70 cm   Diastology LVIDs:         2.50 cm   LV e' medial:    8.05 cm/s LV PW:         1.30 cm   LV E/e' medial:  9.6 LV IVS:        1.30 cm   LV e' lateral:   16.10 cm/s LVOT diam:     1.90 cm   LV E/e' lateral: 4.8 LV SV:         73 LV SV Index:   37 LVOT Area:     2.84 cm  RIGHT VENTRICLE RV S prime:     18.60 cm/s TAPSE (M-mode): 2.0 cm LEFT ATRIUM             Index        RIGHT ATRIUM           Index LA diam:        2.80 cm 1.40 cm/m   RA Area:     12.80 cm LA Vol (A2C):   32.6 ml 16.30 ml/m  RA Volume:   26.70 ml  13.35 ml/m LA Vol (A4C):   31.1 ml 15.55 ml/m LA Biplane Vol: 34.1 ml 17.05 ml/m  AORTIC VALVE LVOT Vmax:   155.00 cm/s LVOT Vmean:  101.000 cm/s LVOT VTI:    0.258 m AI PHT:      611 msec  AORTA Ao Root diam: 3.80 cm MITRAL VALVE MV Area (PHT): 3.74 cm    SHUNTS MV Decel Time: 203 msec    Systemic VTI:  0.26 m MV E velocity: 77.00 cm/s  Systemic Diam: 1.90 cm MV A velocity: 95.90 cm/s MV E/A ratio:  0.80 Oswaldo Milian MD Electronically signed by Oswaldo Milian MD Signature Date/Time: 03/05/2021/1:04:14 PM    Final    VAS [place] LOWER EXTREMITY VENOUS (DVT)  Result Date: 03/06/2021  Lower Venous DVT Study Patient Name:  JAMINE HIGHFILL  Date of Exam:   03/05/2021 Medical Rec #: [id number]      Accession #:    [id number] Date of Birth: Mar 01, 1956       Patient Gender: M Patient Age:   27 years Exam Location:  [facility] Procedure:      VAS [place] LOWER EXTREMITY VENOUS (DVT) Referring Phys: Cornelius Moras Aicia Babinski --------------------------------------------------------------------------------  Indications: Stroke.  Comparison Study: No prior study on file Performing Technologist: Sharion Dove RVS  Examination Guidelines: A complete evaluation includes B-mode imaging, spectral Doppler,  color Doppler, and power Doppler as needed of all accessible portions of each vessel. Bilateral testing is considered an integral part of a complete examination. Limited examinations for reoccurring indications may be performed as noted. The reflux portion of the exam is performed with the patient in reverse Trendelenburg.  +---------+---------------+---------+-----------+----------+--------------+ RIGHT    CompressibilityPhasicitySpontaneityPropertiesThrombus Aging +---------+---------------+---------+-----------+----------+--------------+ CFV      Full           Yes      Yes                                 +---------+---------------+---------+-----------+----------+--------------+ SFJ      Full                                                        +---------+---------------+---------+-----------+----------+--------------+ FV Prox  Full                                                        +---------+---------------+---------+-----------+----------+--------------+ FV Mid   Full                                                        +---------+---------------+---------+-----------+----------+--------------+ FV DistalFull                                                        +---------+---------------+---------+-----------+----------+--------------+ PFV      Full                                                        +---------+---------------+---------+-----------+----------+--------------+ POP      Full           Yes      Yes                                 +---------+---------------+---------+-----------+----------+--------------+ PTV      Full                                                        +---------+---------------+---------+-----------+----------+--------------+ PERO     Full                                                        +---------+---------------+---------+-----------+----------+--------------+    +---------+---------------+---------+-----------+----------+--------------+  LEFT     CompressibilityPhasicitySpontaneityPropertiesThrombus Aging +---------+---------------+---------+-----------+----------+--------------+ CFV      Full           Yes      Yes                                 +---------+---------------+---------+-----------+----------+--------------+ SFJ      Full                                                        +---------+---------------+---------+-----------+----------+--------------+ FV Prox  Full                                                        +---------+---------------+---------+-----------+----------+--------------+ FV Mid   Full                                                        +---------+---------------+---------+-----------+----------+--------------+ FV DistalFull                                                        +---------+---------------+---------+-----------+----------+--------------+ PFV      Full                                                        +---------+---------------+---------+-----------+----------+--------------+ POP      Full           Yes      Yes                                 +---------+---------------+---------+-----------+----------+--------------+ PTV      Full                                                        +---------+---------------+---------+-----------+----------+--------------+ PERO     Full                                                        +---------+---------------+---------+-----------+----------+--------------+     Summary: BILATERAL: - No evidence of deep vein thrombosis seen in the lower extremities, bilaterally. -No evidence of popliteal cyst, bilaterally.   *See table(s) above for measurements and observations. Electronically signed by Orlie Pollen on 03/06/2021 at 10:22:58 AM.    Final  ECG - SR rate 80 BPM. (See cardiology reading for complete  details)   PHYSICAL EXAM  Temp:  [97.6 F (36.4 C)-98.4 F (36.9 C)] 97.6 F (36.4 C) (11/29 0832) Pulse Rate:  [77-101] 101 (11/29 0832) Resp:  [15-20] 20 (11/29 0832) BP: (118-141)/(79-95) 128/80 (11/29 0832) SpO2:  [95 %-100 %] 98 % (11/29 0832)  General - Well nourished, well developed, drowsy but easily arousable.  Ophthalmologic - fundi not visualized due to noncooperation.  Cardiovascular - Regular rhythm and rate.  Mental Status -  Level of arousal and orientation to time, place, and person were intact. Language including expression, naming, repetition, comprehension was assessed and found intact.  Cranial Nerves II - XII - II - left hemianopia III, IV, VI - Extraocular movements intact. V - Facial sensation intact bilaterally. VII - right nasolabial fold flattening, ? Related to poor denture VIII - Hearing & vestibular intact bilaterally. X - Palate elevates symmetrically. XI - Chin turning & shoulder shrug intact bilaterally. XII - Tongue protrusion intact.  Motor Strength - The patient's strength was normal in all extremities and pronator drift was absent.  Bulk was normal and fasciculations were absent.   Motor Tone - Muscle tone was assessed at the neck and appendages and was normal.  Reflexes - The patient's reflexes were symmetrical in all extremities and he had no pathological reflexes.  Sensory - Light touch, temperature/pinprick were assessed and were symmetrical.    Coordination - The patient had normal movements in the hands with no ataxia or dysmetria.  Tremor was absent.  Gait and Station - deferred.    ASSESSMENT/PLAN Mr. SHERMAINE BRIGHAM is a 65 y.o. male with history of GERD, CKD 3, HTN, DM2, prostate cancer, HLD, migraine with visual aura who presented 03/02/21 with hemianopsia x 3 days. Initial limited MRI negative for stroke (pt unable to tolerate complete MRI) Migraine suspected. The pt returned 03/04/21 with continued hemianopsia. Repeat MRI  c/w Rt occipital stroke. Pt. not receive IV t-PA - outside window.   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 MRI repeat pending LE venous Doppler negative for DVT 2D Echo - EF 70-75% LE venous doppler - neg for DVT UDS negative LDL - 77 HgbA1c - 13.5 VTE prophylaxis - [place] Heparin No antithrombotic prior to admission, now on aspirin 81 mg daily and clopidogrel 75 mg daily DAPT for 3 weeks and then aspirin alone Patient will be counseled to be compliant with his antithrombotic medications Ongoing aggressive stroke risk factor management Therapy recommendations: outpt PT Disposition:  Pending  Seizure  Presented with visual hallucination associated with emotional changes, fear and eye pain, occurs at left visual field -> improved with no more visual hallucinations EEG showed three seizure without clinical signs on 03/05/2021 at 1431, 1436 and 1447 arising from right temporo-parietal region LTM EEG  four seizure without clinical signs were noted on 03/05/2021 at 2353 and on 03/06/2021 at [place], 0545 and 0656 arising rom right temporo-parietal region. Average duration 30 seconds.  LTM EEG 11/29 showed 22 22 seizure without clinical signs arising nfrom right temporo-parietal region. Average duration 30 seconds. S/p keppra load 1.5g S/p fosphenytoin load, now on dilantin 100 Q8 Add vimpat 200 bid D/c clonazepam Add  phenobarbital 500mg  IV load followed by 65mg  daily Will repeat MRI w and w/o contrast today   History of migraine Normally happens every 3 to 4 months Frequent visual aura with colored dots and wiggly lines ASA helps  Hypertension Home BP meds: Altace 5 mg  daily Current BP meds: normodyne prn Stable Long-term BP goal normotensive  Hyperlipidemia Home Lipid lowering medication: Lipitor 10 mg daily  LDL 77, goal < 70 Current lipid lowering medication: Lipitor 20 mg daily  Continue statin at discharge  Diabetes Home diabetic meds: Jardiance, metformin HgbA1c - 13.5, goal < 7.0 SSI  CBG monitoring Close PCP follow-up for better DM control  Other Stroke Risk Factors Advanced age Former cigarette smoker - quit 42 years ago Family hx stroke (brother)   Other Active Problems, Code status - Full code CKD - stage III - creatinine 1.81->1.54->1.57   Hospital day # 2

## 2021-03-07 NOTE — Progress Notes (Signed)
PT Cancellation Note  Patient Details Name: [name] MRN: [id number] DOB: Jan 06, 1956   Cancelled Treatment:    Reason Eval/Treat Not Completed: Fatigue/lethargy limiting ability to participate. Pt returned to unit from MRI however was very lethargic 2 medications. Pt unable to participate with PT session at this time. Will check back as schedule allows to continue with PT POC.    Thelma Comp 03/07/2021, 3:33 PM  Rolinda Roan, PT, DPT Acute Rehabilitation Services Pager: [phone] Office: [phone]

## 2021-03-07 NOTE — Procedures (Signed)
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 EEG to evaluate for seizure.    Level of alertness: Awake, asleep   AEDs during EEG study: PHT, Clonazepam   Technical aspects: This EEG study was done with scalp electrodes positioned according to the 10-20 International system of electrode placement. Electrical activity was acquired at a sampling rate of 500Hz  and reviewed with a high frequency filter of 70Hz  and a low frequency filter of 1Hz . EEG data were recorded continuously and digitally stored.    Description: No clear posterior dominant rhythm consists of 8 Hz activity of moderate voltage (25-35 uV) seen predominantly in posterior head regions, symmetric and reactive to eye opening and eye closing. Sleep was characterized by vertex waves, sleep spindles (12 to 14 Hz), maximal frontocentral region.    16 seizure without clinical signs were noted during this study arising from right temporo-parietal region. Average duration 30 seconds. Last seizure was noted on 03/07/2021 at 1646. During seizure, eeg showed 5-6Hz  theta slowing in right temporo-parietal region which then involved all of right frontotemporal region as well as left frontal region and evolved into 3-4hz  delta slowing.   EEG also showed lateralized periodic discharges in right hemisphere, maximal right temporoparietal region at 1 Hz which at times appeared rhythmic consistent with brief ictal-interictal rhythmic discharges   Hyperventilation and photic stimulation were not performed.    Parts of study were difficult to interpret after 0430 on 03/07/2021 due to significant electrode artifact.    ABNORMALITY - Seizure without clinical signs, right temporo-parietal region. -Brief ictal-interictal rhythmic discharges, right  hemisphere, maximal right temporoparietal region  -Lateralized paretic discharges,right hemisphere, maximal right temporoparietal region    IMPRESSION: This study showed 16 seizure without clinical signs arising nfrom right temporo-parietal region. Average duration 30 seconds. Last seizure was noted on 03/07/2021 at 1646.  Additionally, EEG showed evidence of cortical dysfunction and epileptogenicity arising from right hemisphere, maximal right temporoparietal region likely due to underlying structural abnormality with high potential for seizure recurrence.   Rodel Glaspy [name]

## 2021-03-07 NOTE — Progress Notes (Signed)
Inpatient Diabetes Program Recommendations  AACE/ADA: New Consensus Statement on Inpatient Glycemic Control (2015)  Target Ranges:  Prepandial:   less than 140 mg/dL      Peak postprandial:   less than 180 mg/dL (1-2 hours)      Critically ill patients:  140 - 180 mg/dL   Lab Results  Component Value Date   GLUCAP 313 (H) 03/07/2021   HGBA1C 13.5 (H) 03/04/2021      Inpatient Diabetes Program Recommendations:    Novolog 0-5 QHS  Will continue to follow while inpatient.  Thank you, Reche Dixon, RN, BSN Diabetes Coordinator Inpatient Diabetes Program [phone] (team pager from 8a-5p)

## 2021-03-07 NOTE — Progress Notes (Signed)
[facility] PROGRESS NOTE   [name] OFB:[id number] DOB: 1955-06-24 DOA: 03/04/2021  PCP: [name], DO  Brief History/Interval Summary: 65 y.o. male with medical history significant for type 2 diabetes, HLD, CKD stage IIIa, prostate cancer who presented to the ED for persistent headache and change in vision.  Initially presented on 11/24.  MRI brain was limited evaluation due to patient's intolerance.  Patient was discharged home.  Came back with persistent visual deficits.  Repeat MRI showed an acute stroke in the occipital lobe.  Patient was hospitalized for further management.  Patient seen by neurology.  His visual symptoms were concerning also for seizure activity.  EEG confirmed this.  Patient started on phenytoin.  Remains on long-term monitoring.  MRI brain to be repeated at some point.    Reason for Visit: Acute stroke  Consultants: Neurology  Procedures:  Transthoracic echocardiogram EEG    Subjective/Interval History: According to patient's wife he had a restless night.  Was agitated at times.  Complains of headache.  Does not see any flashing lights anymore.  No nausea vomiting.     Assessment/Plan:  Acute stroke Involving right occipital lobe.  Significant left homonymous hemianopsia.  Seeing flashing lights.  Patient underwent CT angiogram head and neck as well.  Also underwent CT venogram of head. Patient remains on aspirin, Plavix and statin.   LDL is 77.  HbA1c is 13.5.  Urine drug screen was unremarkable. Echocardiogram shows normal systolic function.  No shunt was noted.  No embolic source.   No DVT noted on lower extremity Doppler studies. Seen by physical therapy.  Outpatient PT is recommended. EEG and loop recorder was initially recommended by neurology but currently on hold.  Will defer this to neurology.  Seizures His visual symptoms were thought to be due to seizure activity.  EEG was done which showed a seizure activity.  Patient  was started on phenytoin.   Symptoms seem to be better.  Defer management to neurology.  Remains on long-term monitoring.  Neurology plans to repeat MRI brain.    Anxiety Due to his acute visual disturbances.  Xanax as needed.  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  He was on metformin and Jardiance prior to admission.   CBGs seem to be reasonably well controlled here in the hospital.  Just on SSI.  Continue to monitor.    Chronic kidney disease stage IIIa/metabolic acidosis Recent baseline is unknown.  Noted to be worse compared to 2019 when it was 1.49.  ACE inhibitor currently being held.   Creatinine is stable for the most part.  Bicarbonate level is also stable.  It was low likely due to seizures.  Monitor urine output.    Essential hypertension/hyponatremia Holding ACE inhibitor.  Allowing some degree of permissive hypertension.  Patient is reasonably well controlled. Sodium level is stable.  Hyperlipidemia Continue statin.    DVT Prophylaxis: Subcutaneous heparin Code Status: Full code Family Communication: Discussed with patient's wife Disposition Plan: Hopefully return home when improved.  Status is: Inpatient  Remains inpatient appropriate because: Stroke, acute seizures       Medications: Scheduled:  aspirin  81 mg Oral Daily   atorvastatin  20 mg Oral QHS   clopidogrel  75 mg Oral Daily   heparin  5,000 Units Subcutaneous Q8H   insulin aspart  0-9 Units Subcutaneous TID WC   pantoprazole  40 mg Oral q morning   phenytoin (  DILANTIN) IV  100 mg Intravenous Q8H   vitamin B-12  500 mcg Oral Daily   Continuous:  lacosamide (VIMPAT) IV 200 mg (03/06/21 2131)   DQQ:[id number] **OR** acetaminophen (TYLENOL) oral liquid 160 mg/5 mL **OR** acetaminophen, ALPRAZolam, labetalol, ondansetron (ZOFRAN) IV, senna-docusate  Antibiotics: Anti-infectives (From admission, onward)    None        Objective:  Vital Signs  Vitals:   03/06/21 2010 03/06/21 2353 03/07/21 0445 03/07/21 0832  BP: 118/88 (!) 141/80 126/79 128/80  Pulse: 77 79 80 (!) 101  Resp:   16 20  Temp: 98.2 F (36.8 C) 98.4 F (36.9 C) 98.2 F (36.8 C) 97.6 F (36.4 C)  TempSrc: Oral Oral Axillary Oral  SpO2: 99% 95% 100% 98%  Weight:      Height:       No intake or output data in the 24 hours ending 03/07/21 0916  Filed Weights   03/04/21 2040  Weight: 86.2 kg    General appearance: Awake alert.  In no distress.  Somewhat distracted Resp: Clear to auscultation bilaterally.  Normal effort Cardio: S1-S2 is normal regular.  No S3-S4.  No rubs murmurs or bruit GI: Abdomen is soft.  Nontender nondistended.  Bowel sounds are present normal.  No masses organomegaly Extremities: No edema.  Moving all of his extremities Neurologic: Slightly distracted.  Oriented to year or month.  No focal neurological deficits.     Lab Results:  Data Reviewed: I have personally reviewed following labs and imaging studies  CBC: Recent Labs  Lab 03/02/21 1616 03/04/21 1223 03/05/21 0216 03/06/21 0156  WBC 7.6 8.7 6.9 5.4  NEUTROABS 6.1 6.7  --   --   HGB 14.8 16.3 14.3 15.1  HCT 43.6 49.1 41.4 44.1  MCV 87.4 89.3 84.8 84.3  PLT 212 238 221 219     Basic Metabolic Panel: Recent Labs  Lab 03/02/21 1616 03/04/21 1223 03/05/21 0216 03/06/21 0156 03/07/21 0102  NA 132* 136 131* 136 134*  K 4.8 4.3 4.1 3.5 4.2  CL 99 105 103 108 104  CO2 20* 13* 14* 17* 18*  GLUCOSE 371* 200* 213* 105* 227*  BUN 37* 35* 34* 29* 22  CREATININE 2.05* 1.81* 1.81* 1.54* 1.57*  CALCIUM 9.7 9.9 9.0 9.5 9.7     GFR: Estimated Creatinine Clearance: 50.1 mL/min (A) (by C-G formula based on SCr of 1.57 mg/dL (H)).  Liver Function Tests: Recent Labs  Lab 03/02/21 1616 03/07/21 0102  AST 12* 15  ALT 21 17  ALKPHOS 86 83  BILITOT 0.9 0.9  PROT 7.4 6.1*  ALBUMIN 4.6 3.7     Recent Labs  Lab 03/02/21 1616   LIPASE 37     Coagulation Profile: Recent Labs  Lab 03/04/21 1223  INR 0.9      CBG: Recent Labs  Lab 03/06/21 0539 03/06/21 0811 03/06/21 1212 03/06/21 1726 03/06/21 2153  GLUCAP 114* 290* 233* 162* 292*     Lipid Profile: Recent Labs    03/04/21 1322  CHOL 152  HDL 39*  LDLCALC 77  TRIG 181*  CHOLHDL 3.9      Recent Results (from the past 240 hour(s))  Resp Panel by RT-PCR (Flu A&B, Covid) Nasopharyngeal Swab     Status: None   Collection Time: 03/02/21  8:57 PM   Specimen: Nasopharyngeal Swab; Nasopharyngeal(NP) swabs in vial transport medium  Result Value Ref Range Status   SARS Coronavirus 2 by RT PCR NEGATIVE NEGATIVE Final    Comment: (  NOTE) SARS-CoV-2 target nucleic acids are NOT DETECTED.  The SARS-CoV-2 RNA is generally detectable in upper respiratory specimens during the acute phase of infection. The lowest concentration of SARS-CoV-2 viral copies this assay can detect is 138 copies/mL. A negative result does not preclude SARS-Cov-2 infection and should not be used as the sole basis for treatment or other patient management decisions. A negative result may occur with  improper specimen collection/handling, submission of specimen other than nasopharyngeal swab, presence of viral mutation(s) within the areas targeted by this assay, and inadequate number of viral copies(<138 copies/mL). A negative result must be combined with clinical observations, patient history, and epidemiological information. The expected result is Negative.  Fact Sheet for Patients:  EntrepreneurPulse.com.au  Fact Sheet for Healthcare Providers:  IncredibleEmployment.be  This test is no t yet approved or cleared by the [place] FDA and  has been authorized for detection and/or diagnosis of SARS-CoV-2 by FDA under an Emergency Use Authorization (EUA). This EUA will remain  in effect (meaning this test can be used) for the  duration of the COVID-19 declaration under Section 564(b)(1) of the Act, 21 U.S.C.section 360bbb-3(b)(1), unless the authorization is terminated  or revoked sooner.       Influenza A by PCR NEGATIVE NEGATIVE Final   Influenza B by PCR NEGATIVE NEGATIVE Final    Comment: (NOTE) The Xpert Xpress SARS-CoV-2/FLU/RSV plus assay is intended as an aid in the diagnosis of influenza from Nasopharyngeal swab specimens and should not be used as a sole basis for treatment. Nasal washings and aspirates are unacceptable for Xpert Xpress SARS-CoV-2/FLU/RSV testing.  Fact Sheet for Patients: EntrepreneurPulse.com.au  Fact Sheet for Healthcare Providers: IncredibleEmployment.be  This test is not yet approved or cleared by the [place] FDA and has been authorized for detection and/or diagnosis of SARS-CoV-2 by FDA under an Emergency Use Authorization (EUA). This EUA will remain in effect (meaning this test can be used) for the duration of the COVID-19 declaration under Section 564(b)(1) of the Act, 21 U.S.C. section 360bbb-3(b)(1), unless the authorization is terminated or revoked.  Performed at [facility], [address] [place], [place] [address]        Radiology Studies: EEG adult  Result Date: 09-Mar-2021 Lora Havens, MD     2021/03/09  3:34 PM Patient Name: [name] MRN: [id number] Epilepsy Attending: Lora Havens Referring Physician/Provider: Dr Rosalin Hawking Date: 03/09/21 Duration: 21.45 mins Patient history: 65yo M with right PCA stroke and visual hallucinations. EEG to evaluate for seizure. Level of alertness: Awake, asleep AEDs during EEG study: LEV Technical aspects: This EEG study was done with scalp electrodes positioned according to the 10-20 International system of electrode placement. Electrical activity was acquired at a sampling rate of 500Hz  and reviewed with a high frequency filter of 70Hz  and a low frequency filter of  1Hz . EEG data were recorded continuously and digitally stored. Description: No clear posterior dominant rhythm consists of 8 Hz activity of moderate voltage (25-35 uV) seen predominantly in posterior head regions, symmetric and reactive to eye opening and eye closing. Sleep was characterized by vertex waves, sleep spindles (12 to 14 Hz), maximal frontocentral region. Three seizure without clinical signs were noted on Mar 09, 2021 at 1431, 1436 and 1447 arising from right temporo-parietal region. Average duration 30 seconds. During seizure, eeg showed 5-6Hz  theta slowing in right temporo-parietal region which then involved all of right frontotemporal region as well as left frontal region and evolved into 3-4hz  delta slowing. Hyperventilation and photic  stimulation were not performed.   ABNORMALITY - Seizure without clinical signs, right temporo-parietal region. IMPRESSION: This study showed three seizure without clinical signs on 03/05/2021 at 1431, 1436 and 1447 arising from right temporo-parietal region. Average duration 30 seconds. Dr Erlinda Hong was notified. Priyanka [name]   Overnight EEG with video  Result Date: 03/06/2021 Lora Havens, MD     03/07/2021  8:56 AM Patient Name: [name] MRN: [id number] Epilepsy Attending: Lora Havens Referring Physician/Provider: Dr Rosalin Hawking Duration: 03/05/2021 1746 to11/28/2022 1746  Patient history: 65yo M with right PCA stroke and visual hallucinations. EEG to evaluate for seizure.  Level of alertness: Awake, asleep  AEDs during EEG study: PHT, Clonazepam  Technical aspects: This EEG study was done with scalp electrodes positioned according to the 10-20 International system of electrode placement. Electrical activity was acquired at a sampling rate of 500Hz  and reviewed with a high frequency filter of 70Hz  and a low frequency filter of 1Hz . EEG data were recorded continuously and digitally stored.  Description: No clear posterior dominant rhythm consists of 8 Hz  activity of moderate voltage (25-35 uV) seen predominantly in posterior head regions, symmetric and reactive to eye opening and eye closing. Sleep was characterized by vertex waves, sleep spindles (12 to 14 Hz), maximal frontocentral region.  22 seizure without clinical signs were noted during this study arising from right temporo-parietal region. Average duration 30 seconds. Last seizure was noted on 03/06/2021 at 1537. During seizure, eeg showed 5-6Hz  theta slowing in right temporo-parietal region which then involved all of right frontotemporal region as well as left frontal region and evolved into 3-4hz  delta slowing. EEG also showed lateralized periodic discharges in right hemisphere, maximal right temporoparietal region at 1 Hz which at times appeared rhythmic consistent with brief ictal-interictal rhythmic discharges  Hyperventilation and photic stimulation were not performed.    ABNORMALITY - Seizure without clinical signs, right temporo-parietal region. -Brief ictal-interictal rhythmic discharges, right hemisphere, maximal right temporoparietal region -Lateralized paretic discharges,right hemisphere, maximal right temporoparietal region  IMPRESSION: This study showed 22 seizure without clinical signs arising nfrom right temporo-parietal region. Average duration 30 seconds. Last seizure was noted on 03/06/2021 at 1537. Additionally, EEG showed evidence of cortical dysfunction and epileptogenicity arising from right hemisphere, maximal right temporoparietal region likely due to underlying structural abnormality with high potential for seizure recurrence.  Lora Havens   ECHOCARDIOGRAM COMPLETE BUBBLE STUDY  Result Date: 03/05/2021    ECHOCARDIOGRAM REPORT   Patient Name:   AURON TADROS Date of Exam: 03/05/2021 Medical Rec #:  [id number]     Height:       68.0 in Accession #:    [id number]    Weight:       190.0 lb Date of Birth:  [birth year]      BSA:          2.000 m Patient Age:    43 years      BP:            142/73 mmHg Patient Gender: M             HR:           79 bpm. Exam Location:  Inpatient Procedure: 2D Echo and Saline Contrast Bubble Study Indications:    Stroke I63.9  History:        Patient has no prior history of Echocardiogram examinations.                 Risk  Factors:Hypertension, Dyslipidemia and Diabetes.  Sonographer:    Mikki Santee RDCS Referring Phys: [place]  1. Left ventricular ejection fraction, by estimation, is 70 to 75%. The left ventricle has hyperdynamic function. The left ventricle has no regional wall motion abnormalities. There is mild left ventricular hypertrophy. Left ventricular diastolic parameters were normal.  2. Right ventricular systolic function is normal. The right ventricular size is normal.  3. The mitral valve is normal in structure. Trivial mitral valve regurgitation. No evidence of mitral stenosis.  4. The aortic valve is tricuspid. Aortic valve regurgitation is mild. No aortic stenosis is present.  5. The inferior vena cava is normal in size with greater than 50% respiratory variability, suggesting right atrial pressure of 3 mmHg.  6. Agitated saline contrast bubble study was negative, with no evidence of any interatrial shunt. FINDINGS  Left Ventricle: Left ventricular ejection fraction, by estimation, is 70 to 75%. The left ventricle has hyperdynamic function. The left ventricle has no regional wall motion abnormalities. The left ventricular internal cavity size was small. There is mild left ventricular hypertrophy. Left ventricular diastolic parameters were normal. Right Ventricle: The right ventricular size is normal. No increase in right ventricular wall thickness. Right ventricular systolic function is normal. Left Atrium: Left atrial size was normal in size. Right Atrium: Right atrial size was normal in size. Pericardium: There is no evidence of pericardial effusion. Mitral Valve: The mitral valve is normal in structure.  Trivial mitral valve regurgitation. No evidence of mitral valve stenosis. Tricuspid Valve: The tricuspid valve is normal in structure. Tricuspid valve regurgitation is trivial. Aortic Valve: The aortic valve is tricuspid. Aortic valve regurgitation is mild. Aortic regurgitation PHT measures 611 msec. No aortic stenosis is present. Pulmonic Valve: The pulmonic valve was not well visualized. Pulmonic valve regurgitation is not visualized. Aorta: The aortic root and ascending aorta are structurally normal, with no evidence of dilitation. Venous: The inferior vena cava is normal in size with greater than 50% respiratory variability, suggesting right atrial pressure of 3 mmHg. IAS/Shunts: The interatrial septum was not well visualized. Agitated saline contrast was given intravenously to evaluate for intracardiac shunting. Agitated saline contrast bubble study was negative, with no evidence of any interatrial shunt.  LEFT VENTRICLE PLAX 2D LVIDd:         3.70 cm   Diastology LVIDs:         2.50 cm   LV e' medial:    8.05 cm/s LV PW:         1.30 cm   LV E/e' medial:  9.6 LV IVS:        1.30 cm   LV e' lateral:   16.10 cm/s LVOT diam:     1.90 cm   LV E/e' lateral: 4.8 LV SV:         73 LV SV Index:   37 LVOT Area:     2.84 cm  RIGHT VENTRICLE RV S prime:     18.60 cm/s TAPSE (M-mode): 2.0 cm LEFT ATRIUM             Index        RIGHT ATRIUM           Index LA diam:        2.80 cm 1.40 cm/m   RA Area:     12.80 cm LA Vol (A2C):   32.6 ml 16.30 ml/m  RA Volume:   26.70 ml  13.35 ml/m LA Vol (A4C):  31.1 ml 15.55 ml/m LA Biplane Vol: 34.1 ml 17.05 ml/m  AORTIC VALVE LVOT Vmax:   155.00 cm/s LVOT Vmean:  101.000 cm/s LVOT VTI:    0.258 m AI PHT:      611 msec  AORTA Ao Root diam: 3.80 cm MITRAL VALVE MV Area (PHT): 3.74 cm    SHUNTS MV Decel Time: 203 msec    Systemic VTI:  0.26 m MV E velocity: 77.00 cm/s  Systemic Diam: 1.90 cm MV A velocity: 95.90 cm/s MV E/A ratio:  0.80 Oswaldo Milian MD Electronically  signed by Oswaldo Milian MD Signature Date/Time: 03/05/2021/1:04:14 PM    Final    VAS [place] LOWER EXTREMITY VENOUS (DVT)  Result Date: 03/06/2021  Lower Venous DVT Study Patient Name:  RISHAN OYAMA  Date of Exam:   03/05/2021 Medical Rec #: [id number]      Accession #:    [id number] Date of Birth: 1955/05/04       Patient Gender: M Patient Age:   71 years Exam Location:  [facility] Procedure:      VAS [place] LOWER EXTREMITY VENOUS (DVT) Referring Phys: Cornelius Moras XU --------------------------------------------------------------------------------  Indications: Stroke.  Comparison Study: No prior study on file Performing Technologist: Sharion Dove RVS  Examination Guidelines: A complete evaluation includes B-mode imaging, spectral Doppler, color Doppler, and power Doppler as needed of all accessible portions of each vessel. Bilateral testing is considered an integral part of a complete examination. Limited examinations for reoccurring indications may be performed as noted. The reflux portion of the exam is performed with the patient in reverse Trendelenburg.  +---------+---------------+---------+-----------+----------+--------------+ RIGHT    CompressibilityPhasicitySpontaneityPropertiesThrombus Aging +---------+---------------+---------+-----------+----------+--------------+ CFV      Full           Yes      Yes                                 +---------+---------------+---------+-----------+----------+--------------+ SFJ      Full                                                        +---------+---------------+---------+-----------+----------+--------------+ FV Prox  Full                                                        +---------+---------------+---------+-----------+----------+--------------+ FV Mid   Full                                                        +---------+---------------+---------+-----------+----------+--------------+ FV DistalFull                                                         +---------+---------------+---------+-----------+----------+--------------+ PFV      Full                                                        +---------+---------------+---------+-----------+----------+--------------+  POP      Full           Yes      Yes                                 +---------+---------------+---------+-----------+----------+--------------+ PTV      Full                                                        +---------+---------------+---------+-----------+----------+--------------+ PERO     Full                                                        +---------+---------------+---------+-----------+----------+--------------+   +---------+---------------+---------+-----------+----------+--------------+ LEFT     CompressibilityPhasicitySpontaneityPropertiesThrombus Aging +---------+---------------+---------+-----------+----------+--------------+ CFV      Full           Yes      Yes                                 +---------+---------------+---------+-----------+----------+--------------+ SFJ      Full                                                        +---------+---------------+---------+-----------+----------+--------------+ FV Prox  Full                                                        +---------+---------------+---------+-----------+----------+--------------+ FV Mid   Full                                                        +---------+---------------+---------+-----------+----------+--------------+ FV DistalFull                                                        +---------+---------------+---------+-----------+----------+--------------+ PFV      Full                                                        +---------+---------------+---------+-----------+----------+--------------+ POP      Full           Yes      Yes                                  +---------+---------------+---------+-----------+----------+--------------+  PTV      Full                                                        +---------+---------------+---------+-----------+----------+--------------+ PERO     Full                                                        +---------+---------------+---------+-----------+----------+--------------+     Summary: BILATERAL: - No evidence of deep vein thrombosis seen in the lower extremities, bilaterally. -No evidence of popliteal cyst, bilaterally.   *See table(s) above for measurements and observations. Electronically signed by Orlie Pollen on 03/06/2021 at 10:22:58 AM.    Final        LOS: 2 days   [facility] Pager on [email or web address]  03/07/2021, 9:16 AM

## 2021-03-08 DIAGNOSIS — N1831 Chronic kidney disease, stage 3a: Secondary | ICD-10-CM | POA: Diagnosis not present

## 2021-03-08 DIAGNOSIS — R441 Visual hallucinations: Secondary | ICD-10-CM | POA: Diagnosis not present

## 2021-03-08 DIAGNOSIS — E1169 Type 2 diabetes mellitus with other specified complication: Secondary | ICD-10-CM | POA: Diagnosis not present

## 2021-03-08 DIAGNOSIS — G40919 Epilepsy, unspecified, intractable, without status epilepticus: Secondary | ICD-10-CM | POA: Diagnosis not present

## 2021-03-08 DIAGNOSIS — R569 Unspecified convulsions: Secondary | ICD-10-CM

## 2021-03-08 DIAGNOSIS — E1122 Type 2 diabetes mellitus with diabetic chronic kidney disease: Secondary | ICD-10-CM | POA: Diagnosis not present

## 2021-03-08 DIAGNOSIS — I639 Cerebral infarction, unspecified: Secondary | ICD-10-CM | POA: Diagnosis not present

## 2021-03-08 DIAGNOSIS — E785 Hyperlipidemia, unspecified: Secondary | ICD-10-CM | POA: Diagnosis not present

## 2021-03-08 LAB — CSF CELL COUNT WITH DIFFERENTIAL
RBC Count, CSF: 0 /mm3
RBC Count, CSF: 36 /mm3 — ABNORMAL HIGH
Tube #: 1
WBC, CSF: 1 /mm3 (ref 0–5)
WBC, CSF: 2 /mm3 (ref 0–5)

## 2021-03-08 LAB — BASIC METABOLIC PANEL
Anion gap: 8 (ref 5–15)
BUN: 16 mg/dL (ref 8–23)
CO2: 20 mmol/L — ABNORMAL LOW (ref 22–32)
Calcium: 9.1 mg/dL (ref 8.9–10.3)
Chloride: 102 mmol/L (ref 98–111)
Creatinine, Ser: 1.49 mg/dL — ABNORMAL HIGH (ref 0.61–1.24)
GFR, Estimated: 52 mL/min — ABNORMAL LOW (ref >60)
Glucose, Bld: 193 mg/dL — ABNORMAL HIGH (ref 70–99)
Potassium: 4 mmol/L (ref 3.5–5.1)
Sodium: 130 mmol/L — ABNORMAL LOW (ref 135–145)

## 2021-03-08 LAB — CBC
HCT: 45.4 % (ref 39.0–52.0)
Hemoglobin: 15.9 g/dL (ref 13.0–17.0)
MCH: 29.2 pg (ref 26.0–34.0)
MCHC: 35 g/dL (ref 30.0–36.0)
MCV: 83.5 fL (ref 80.0–100.0)
Platelets: 186 10*3/uL (ref 150–400)
RBC: 5.44 MIL/uL (ref 4.22–5.81)
RDW: 12.4 % (ref 11.5–15.5)
WBC: 4.6 10*3/uL (ref 4.0–10.5)
nRBC: 0 % (ref 0.0–0.2)

## 2021-03-08 LAB — TSH: TSH: 0.293 u[IU]/mL — ABNORMAL LOW (ref 0.350–4.500)

## 2021-03-08 LAB — GLUCOSE, CAPILLARY
Glucose-Capillary: 202 mg/dL — ABNORMAL HIGH (ref 70–99)
Glucose-Capillary: 259 mg/dL — ABNORMAL HIGH (ref 70–99)
Glucose-Capillary: 227 mg/dL — ABNORMAL HIGH (ref 70–99)
Glucose-Capillary: 241 mg/dL — ABNORMAL HIGH (ref 70–99)

## 2021-03-08 LAB — PHENOBARBITAL LEVEL: Phenobarbital: 7.5 ug/mL — ABNORMAL LOW (ref 15.0–30.0)

## 2021-03-08 LAB — PHENYTOIN LEVEL, TOTAL: Phenytoin Lvl: 17.1 ug/mL (ref 10.0–20.0)

## 2021-03-08 LAB — CRYPTOCOCCAL ANTIGEN, CSF: Crypto Ag: NEGATIVE

## 2021-03-08 LAB — MAGNESIUM: Magnesium: 1.8 mg/dL (ref 1.7–2.4)

## 2021-03-08 LAB — PROTEIN AND GLUCOSE, CSF
Glucose, CSF: 124 mg/dL — ABNORMAL HIGH (ref 40–70)
Total  Protein, CSF: 50 mg/dL — ABNORMAL HIGH (ref 15–45)

## 2021-03-08 LAB — T4, FREE: Free T4: 1.12 ng/dL (ref 0.61–1.12)

## 2021-03-08 MED ORDER — LIVING WELL WITH DIABETES BOOK
Freq: Once | Status: AC
  Filled 2021-03-08: qty 1

## 2021-03-08 MED ORDER — PHENOBARBITAL 32.4 MG PO TABS: 64.8000 mg | ORAL_TABLET | Freq: Two times a day (BID) | ORAL | Status: DC

## 2021-03-08 MED ORDER — PHENOBARBITAL 32.4 MG PO TABS
64.8000 mg | ORAL_TABLET | Freq: Two times a day (BID) | ORAL | Status: DC
  Administered 2021-03-08 – 2021-03-10 (×4): 64.8 mg via ORAL
  Filled 2021-03-08 (×4): qty 2

## 2021-03-08 MED ORDER — LIDOCAINE HCL 1 % IJ SOLN
5.0000 mL | Freq: Once | INTRAMUSCULAR | Status: DC
  Filled 2021-03-08 (×5): qty 5

## 2021-03-08 MED ORDER — INSULIN STARTER KIT- PEN NEEDLES (ENGLISH)
1.0000 | Freq: Once | Status: AC
  Administered 2021-03-08: 1
  Filled 2021-03-08: qty 1

## 2021-03-08 MED ORDER — ALPRAZOLAM 0.5 MG PO TABS
0.5000 mg | ORAL_TABLET | Freq: Two times a day (BID) | ORAL | Status: DC | PRN
  Administered 2021-03-10 – 2021-03-11 (×2): 0.5 mg via ORAL
  Filled 2021-03-08 (×2): qty 1

## 2021-03-08 MED ORDER — ENOXAPARIN SODIUM 40 MG/0.4ML IJ SOSY
40.0000 mg | PREFILLED_SYRINGE | INTRAMUSCULAR | Status: DC
  Administered 2021-03-08 – 2021-03-12 (×5): 40 mg via SUBCUTANEOUS
  Filled 2021-03-08 (×5): qty 0.4

## 2021-03-08 MED ORDER — SODIUM CHLORIDE 0.9 % IV SOLN
500.0000 mg | Freq: Once | INTRAVENOUS | Status: AC
  Administered 2021-03-08: 500 mg via INTRAVENOUS
  Filled 2021-03-08: qty 3.85

## 2021-03-08 MED ORDER — LIDOCAINE HCL 1 % IJ SOLN
5.0000 mL | Freq: Once | INTRAMUSCULAR | Status: DC
  Filled 2021-03-08: qty 5

## 2021-03-08 NOTE — Progress Notes (Addendum)
STROKE TEAM PROGRESS NOTE   ATTENDING NOTE: I reviewed above note and agree with the assessment and plan. Pt was seen and examined.   Wife and son are at the bedside. Pt sitting in bed for lunch, much awake and alert, less lethargic. Seems in better spirit. On exam, AAO x3, no aphasia, no significant hemianopia. Previous left hemianopia much improved and nearly resolved. Still on LTM EEG. However, EEG continues to report 14 seizures over night, more concentrated before midnight. Wife reported some agitation at that time and he again did not get good sleep last night. Pt had MRI yesterday but not able to tolerate contrast portion. So far noncon MRI showed abnormal cortically based diffusion signal in the occipital and temporal lobes, greater in extent than on the recent prior MRI and favored to reflect the sequelae of recent seizure activities. No clear or significant stroke seen.  This am phenobarbital level was low, discussed with Dr. Hortense Ramal, will again give 500mg  IV phenobarbital and continue 65mg  bid. Continue dilantin and vimpat. Daily phenobarbital and dilantin level. Will do LP today to rule out CNS infection and inflammation. Will also check TSH, FT4 and hashimoto antibodies to rule out hashimoto encephalitis. Will resume lovenox for DVT prophylaxis after LP. Continue ASA 81 for now, plavix has been discontinued.   For detailed assessment and plan, please refer to above as I have made changes wherever appropriate.   Rosalin Hawking, MD PhD Stroke Neurology 03/08/2021 10:23 PM    INTERVAL HISTORY Patient is seen in his room with his wife at the bedside.  He has continued to have seizures overnight but remains hemodynamically stable.  Phenobarbital was increased due to continued seizures and LP will be performed today to rule out CNS infection. LTM EEG continues. Patient denies hallucinations and states that he just wants to get well. His wife reports that he continues to have intermittent episodes  of agitation but can generally be verbally redirected.    OBJECTIVE Vitals:   03/07/21 2335 03/08/21 0355 03/08/21 0853 03/08/21 1340  BP: 140/89 115/85 130/85 (!) 142/81  Pulse: 88 97 (!) 105 100  Resp: 19 17 17 18   Temp: 98.4 F (36.9 C) 98.3 F (36.8 C) 98.1 F (36.7 C) 98.4 F (36.9 C)  TempSrc: Oral Oral Oral Oral  SpO2: 100% 96% 97% 96%  Weight:      Height:        CBC:  Recent Labs  Lab 03/02/21 1616 03/04/21 1223 03/05/21 0216 03/06/21 0156 03/08/21 0359  WBC 7.6 8.7   < > 5.4 4.6  NEUTROABS 6.1 6.7  --   --   --   HGB 14.8 16.3   < > 15.1 15.9  HCT 43.6 49.1   < > 44.1 45.4  MCV 87.4 89.3   < > 84.3 83.5  PLT 212 238   < > 219 186   < > = values in this interval not displayed.     Basic Metabolic Panel:  Recent Labs  Lab 03/07/21 0102 03/08/21 0359  NA 134* 130*  K 4.2 4.0  CL 104 102  CO2 18* 20*  GLUCOSE 227* 193*  BUN 22 16  CREATININE 1.57* 1.49*  CALCIUM 9.7 9.1  MG  --  1.8     Lipid Panel:     Component Value Date/Time   CHOL 152 03/04/2021 1322   TRIG 181 (H) 03/04/2021 1322   HDL 39 (L) 03/04/2021 1322   CHOLHDL 3.9 03/04/2021 1322   VLDL  36 03/04/2021 1322   LDLCALC 77 03/04/2021 1322   HgbA1c:  Lab Results  Component Value Date   HGBA1C 13.5 (H) 03/04/2021   Urine Drug Screen:     Component Value Date/Time   LABOPIA NONE DETECTED 03/05/2021 0600   COCAINSCRNUR NONE DETECTED 03/05/2021 0600   LABBENZ NONE DETECTED 03/05/2021 0600   AMPHETMU NONE DETECTED 03/05/2021 0600   THCU NONE DETECTED 03/05/2021 0600   LABBARB NONE DETECTED 03/05/2021 0600    Alcohol Level No results found for: [facility]  IMAGING  MR BRAIN WO CONTRAST  Result Date: 03/07/2021 CLINICAL DATA:  Chronic seizures. History of trauma. EEG with evidence of seizures arising from the right temporoparietal region. EXAM: MRI HEAD WITHOUT CONTRAST TECHNIQUE: Multiplanar, multiecho pulse sequences of the brain and surrounding structures were obtained without  intravenous contrast. COMPARISON:  Head MRI 03/04/2021 FINDINGS: Despite being premedicated, the patient could not tolerate the complete examination. A complete noncontrast seizure protocol MRI was obtained with the exception of thin section coronal FLAIR imaging. IV contrast was not administered. Multiple sequences are mildly to moderately motion degraded. Brain: There is persistent cortically based trace diffusion weighted signal hyperintensity in the medial right occipital lobe, and there is now also asymmetric diffusion weighted signal and subtle T2 hyperintensity in the mesial right temporal lobe, particularly the hippocampus. More subtle cortical diffusion abnormality is questioned elsewhere in the right temporal, occipital, and parietal lobes although this may be artifactual due to motion. There is also subcortical white matter T2 hypointensity in the medial right occipital and right temporal lobes. The ventricles and sulci are normal. No intracranial hemorrhage, mass, midline shift, or extra-axial fluid collection is identified. A few small T2 hyperintensities in the cerebral white matter bilaterally elsewhere are unchanged and nonspecific but compatible with minimal chronic small vessel ischemic disease. A dilated perivascular space or a punctate chronic infarct in the midline of the pons is unchanged. Vascular: Major intracranial vascular flow voids are preserved. Skull and upper cervical spine: Unremarkable bone marrow signal. Sinuses/Orbits: Unremarkable orbits. Clear paranasal sinuses. Trace bilateral mastoid effusions. Other: None. IMPRESSION: 1. Motion degraded, incomplete examination. 2. Abnormal cortically based diffusion signal in the occipital and temporal lobes, greater in extent than on the recent prior MRI and favored to reflect the sequelae of recent seizure activity although a component of acute ischemia is also possible. Subcortical T2 hypointensity in these regions is of indeterminate  etiology but has been reported with nonketotic hyperglycemia and occipital seizures as well as ischemia and encephalitis. Electronically Signed   By: Logan Bores M.D.   On: 03/07/2021 15:46     ECG - SR rate 80 BPM. (See cardiology reading for complete details)   PHYSICAL EXAM  Temp:  [97.8 F (36.6 C)-98.4 F (36.9 C)] 98.4 F (36.9 C) (11/30 1340) Pulse Rate:  [82-105] 100 (11/30 1340) Resp:  [17-20] 18 (11/30 1340) BP: (115-151)/(80-103) 142/81 (11/30 1340) SpO2:  [94 %-100 %] 96 % (11/30 1340)  General - Alert, well-developed, well-nourished male in no acute distress.   NEURO:  Mental Status: AA&Ox3  Speech/Language: speech is without dysarthria or aphasia.    Cranial Nerves:  II: PERRL.  III, IV, VI: EOMI. Eyelids elevate symmetrically.  V: Sensation is intact to light touch and symmetrical to face.  VII: Smile is symmetrical.  VIII: hearing intact to voice. IX, X:  Phonation is normal.  XII: tongue is midline without fasciculations. Motor: 5/5 strength to all muscle groups tested.  Sensation- Intact to light touch bilaterally.  Extinction absent to light touch to DSS.    Coordination: FTN intact on right, very slight ataxia on left, No drift.  Gait- deferred     ASSESSMENT/PLAN [name] is a 65 y.o. male with history of GERD, CKD 3, HTN, DM2, prostate cancer, HLD, migraine with visual aura who presented 03/02/21 with hemianopsia x 3 days. Initial limited MRI negative for stroke (pt unable to tolerate complete MRI) Migraine suspected. The pt returned 03/04/21 with continued hemianopsia. Repeat MRI c/w Rt occipital stroke. Pt. not receive IV t-PA - outside window. LTM EEG was initiated and patient was seen to have multiple seizures overnight.  Will increase phenobarbital dose in an attempt to eliminate seizures.  Will perform LP today to rule out CNS infection.  Seizure, intractable Presented with visual hallucination associated with emotional changes, fear and  eye pain, occurs at left visual field -> improved with no more visual hallucinations EEG showed three seizure without clinical signs on 03/05/2021 at 1431, 1436 and 1447 arising from right temporo-parietal region LTM EEG  four seizure without clinical signs were noted on 03/05/2021 at 2353 and on 03/06/2021 at [place], 0545 and 0656 arising rom right temporo-parietal region. Average duration 30 seconds.  LTM EEG 11/29 showed 22 22 seizure without clinical signs arising nfrom right temporo-parietal region. Average duration 30 seconds. LTM EEG 11/30 showed continued seizures overnight, 14 in total S/p keppra load 1.5g S/p fosphenytoin load, now on dilantin 100 Q8 Add vimpat 200 bid D/c clonazepam Add phenobarbital 500mg  IV load followed by 65mg  daily-> additional 500mg  load 11/30 and increase dosing to BID MRI repeated, demonstrating abnormal cortically base signal in occipital and temporal lobes, likely sequelae of seizure activity LP today to rule out CNS infection/inflammation TSH, FT4, hashimoto antibodies pending  No significant stroke at right PCA  CT Head - unremarkable CTA H&N with CTV - 03/03/21 -unremarkable MRI head 11/24 Limited exam, no stroke MRI head 11/26 - subtle restricted diffusion within the medial right occipital lobe, spanning approximately 2.8 cm, compatible with acute infarction (right PCA vascular territory). MRI repeat abnormal cortically base signal in occipital and temporal lobes, likely sequelae of seizure activity LE venous Doppler negative for DVT 2D Echo - EF 70-75% UDS negative LDL - 77 HgbA1c - 13.5 VTE prophylaxis - [place] Heparin No antithrombotic prior to admission, now on aspirin 81 mg daily. Plavix has discontinued Therapy recommendations: outpt PT Disposition:  Pending  History of migraine Normally happens every 3 to 4 months Frequent visual aura with colored dots and wiggly lines ASA helps  Hypertension Home BP meds: Altace 5 mg daily Current BP  meds: normodyne prn Stable Long-term BP goal normotensive  Hyperlipidemia Home Lipid lowering medication: Lipitor 10 mg daily  LDL 77, goal < 70 Current lipid lowering medication: Lipitor 20 mg daily  Continue statin at discharge  Diabetes, uncontrolled Home diabetic meds: Jardiance, metformin HgbA1c - 13.5, goal < 7.0 Intermittent hyperglycemia SSI  CBG monitoring Close PCP follow-up for better DM control  Other Stroke Risk Factors Advanced age Former cigarette smoker - quit 42 years ago Family hx stroke (brother)   Other Active Problems, Code status - Full code CKD - stage III - creatinine 1.81->1.54->1.57->1.49   Hospital day # [place] , MSN, AGACNP-BC [facility] See Amion for schedule and pager information 03/08/2021 2:25 PM

## 2021-03-08 NOTE — Care Management Important Message (Signed)
Important Message  Patient Details  Name: [name] MRN: [id number] Date of Birth: 09-19-1955   Medicare Important Message Given:        Orbie Pyo 03/08/2021, 2:31 PM

## 2021-03-08 NOTE — TOC Initial Note (Signed)
Transition of Care [facility]) - Initial/Assessment Note    Patient Details  Name: [name] MRN: [id number] Date of Birth: May 25, 1955  Transition of Care [facility]) CM/SW Contact:    Pollie Friar, RN Phone Number: 03/08/2021, 2:17 PM  Clinical Narrative:                 Patient is from home with his spouse that can provide needed supervision. Pts family is able to provide needed transportation.  Pt denies issues with home medications. Recommendations are for outpatient therapy but spouse prefers home health services at this time. Wife and pt without preference on home health agency. [place] arranged with [facility]. Information on the AVS.  3 in 1 recommended and will order through [facility] once pt is closer to d/c.  TOC following.  Expected Discharge Plan: [place] Barriers to Discharge: Continued Medical Work up   Patient Goals and CMS Choice   CMS Medicare.gov Compare Post Acute Care list provided to:: Patient Represenative (must comment) Choice offered to / list presented to : Spouse  Expected Discharge Plan and Services Expected Discharge Plan: [place]   Discharge Planning Services: CM Consult Post Acute Care Choice: Home Health, Durable Medical Equipment Living arrangements for the past 2 months: Single Family Home                 DME Arranged: 3-N-1 DME Agency: [facility]       HH Arranged: PT, OT HH Agency: [place] Date [place]: 03/08/21   Representative spoke with at [place]: Tommi Rumps  Prior Living Arrangements/Services Living arrangements for the past 2 months: [place] Lives with:: Spouse Patient language and need for interpreter reviewed:: Yes Do you feel safe going back to the place where you live?: Yes      Need for Family Participation in Patient Care: Yes (Comment) Care giver support system in place?: Yes (comment) Current home services: DME (cane) Criminal Activity/Legal Involvement  Pertinent to Current Situation/Hospitalization: No - Comment as needed  Activities of Daily Living      Permission Sought/Granted                  Emotional Assessment Appearance:: Appears stated age Attitude/Demeanor/Rapport: Engaged Affect (typically observed): Accepting Orientation: : Oriented to Self, Oriented to Place, Oriented to  Time, Oriented to Situation   Psych Involvement: No (comment)  Admission diagnosis:  Occipital infarction [facility]) [I63.9] Acute CVA (cerebrovascular accident) [facility] [I63.9] Patient Active Problem List   Diagnosis Date Noted   Acute CVA (cerebrovascular accident) ([place]) 03/05/2021   Occipital infarction ([place]) 03/04/2021   CKD (chronic kidney disease), stage III ([place]) 03/04/2021   Type 2 diabetes mellitus ([place]) 03/04/2021   Hyperlipidemia associated with type 2 diabetes mellitus ([place]) 03/04/2021   Malignant neoplasm of prostate ([place]) 04/30/2016   Incisional hernia, without obstruction or gangrene 12/07/2015   Incisional hernia with obstruction 12/07/2015   PCP:  [name], DO Pharmacy:   [id number] [place], [address] [place] [address] Phone: [phone] Fax: [phone]  [facility] [address] [place] [address] Phone: [phone] Fax: [phone]     Social Determinants of Health (SDOH) Interventions    Readmission Risk Interventions No flowsheet data found.

## 2021-03-08 NOTE — Procedures (Signed)
Patient Name: [name]  MRN: [id number]  Epilepsy Attending: Lora Havens  Referring Physician/Provider: Dr Rosalin Hawking Duration: 03/07/2021 1746 to11/30/2022 1746   Patient history: 65yo M with right PCA stroke and visual hallucinations. EEG to evaluate for seizure.    Level of alertness: Awake, asleep   AEDs during EEG study: PHT,Phenobarb,Vimpat, Clonazepam   Technical aspects: This EEG study was done with scalp electrodes positioned according to the 10-20 International system of electrode placement. Electrical activity was acquired at a sampling rate of 500Hz  and reviewed with a high frequency filter of 70Hz  and a low frequency filter of 1Hz . EEG data were recorded continuously and digitally stored.    Description: No clear posterior dominant rhythm consists of 8 Hz activity of moderate voltage (25-35 uV) seen predominantly in posterior head regions, symmetric and reactive to eye opening and eye closing. Sleep was characterized by vertex waves, sleep spindles (12 to 14 Hz), maximal frontocentral region.    14 seizure without clinical signs were noted during this study arising from right temporo-parietal region. Average duration 30 seconds. All the seizures occurred between 2130 to 2350. Last seizure was noted on 03/07/2021 at 2350. During seizure, eeg showed 5-6Hz  theta slowing in right temporo-parietal region which then involved all of right frontotemporal region as well as left frontal region and evolved into 3-4hz  delta slowing.   EEG also showed lateralized periodic discharges in right hemisphere, maximal right temporoparietal region at 1 Hz which at times appeared rhythmic consistent with brief ictal-interictal rhythmic discharges.   Hyperventilation and photic stimulation were not performed.    Parts of the study were difficult to interpret due to significant electrode and movement artifact.     ABNORMALITY - Seizure without clinical signs, right temporo-parietal  region. -Brief ictal-interictal rhythmic discharges, right hemisphere, maximal right temporoparietal region  -Lateralized paretic discharges,right hemisphere, maximal right temporoparietal region    IMPRESSION: This study showed 14 seizure without clinical signs arising  from right temporo-parietal region. Average duration 30 seconds. Last seizure was noted on 03/07/2021 at 2350.   Additionally, EEG showed evidence of cortical dysfunction and epileptogenicity arising from right hemisphere, maximal right temporoparietal region likely due to underlying structural abnormality with high potential for seizure recurrence.   [name] [name]

## 2021-03-08 NOTE — Progress Notes (Signed)
At 1345 pt got very confused and agitated, wanting to get up and walk to the bathroom, pt attached to IV and to EEG machine/wires. Unable to get pt to use the urinal, wife and son at bedside assisting.    (Pt currently has phenobarbital on board per Dr Phoebe Sharps order for 1 time dose in prep for bedside spinal tap this afternoon)   Pt put on BSC to void as not able to redirect pt to use urinal.  Pt very confused, wanting his bed at home.   Arguing and cursing at family. Wife states he did this yesterday also when he got the medicine for MRI (phenobarbital was given per report)    after back to bed pt got sleepy, but arousable, then woke up and cursing/agitated at wife again, then back to sleep.  Family at bedside (wife and son), bed alarm on.

## 2021-03-08 NOTE — Progress Notes (Signed)
Inpatient Diabetes Program Recommendations  AACE/ADA: New Consensus Statement on Inpatient Glycemic Control (2015)  Target Ranges:  Prepandial:   less than 140 mg/dL      Peak postprandial:   less than 180 mg/dL (1-2 hours)      Critically ill patients:  140 - 180 mg/dL   Lab Results  Component Value Date   GLUCAP 259 (H) 03/08/2021   HGBA1C 13.5 (H) 03/04/2021    Review of Glycemic Control  Latest Reference Range & Units 03/07/21 10:43 03/07/21 12:28 03/07/21 16:14 03/07/21 21:18 03/08/21 06:07 03/08/21 12:21  Glucose-Capillary 70 - 99 mg/dL 313 (H) 259 (H) 158 (H) 125 (H) 202 (H) 259 (H)  (H): Data is abnormally high  Diabetes history: DM2 Outpatient Diabetes medications: Jardiance 25 mg QD, Metformin 500 mg BID Current orders for Inpatient glycemic control: Novolog 0-9 units TID  Inpatient Diabetes Program Recommendations:    Appears postprandials are elevated.  Might consider Novolog 3 units TID with meals if he consumes at least 50%  Spoke with patients wife and son at bedside.  Patient is currently sleeping.  She confirms above home medications.  He does not skip doses.  He checks his blood sugar daily and for the last several weeks his blood sugars have been in the 300's.  He has been very thirsty and having frequent urination.  He is current with his PCP.  He has expressed concern for need of insulin and patient has refused.  His PCP wants to send thim to an endocrinologist. Reviewed patient's current A1c of 13.5% (average blood sugar of 341 mg/dL). Explained what a A1c is and what it measures. Also reviewed goal A1c with patient, importance of good glucose control @ home, and blood sugar goals.  He does not drink any beverages with sugar and tries to watch his CHO's if his blood sugar is elevated.  Ordered LWWD booklet and insulin starter kit.  Patient will be having an LP today.  Will see wife again tomorrow and teach insulin pen.  Will continue to follow while  inpatient.  Thank you, Reche Dixon, RN, BSN Diabetes Coordinator Inpatient Diabetes Program [phone] (team pager from 8a-5p)

## 2021-03-08 NOTE — Progress Notes (Signed)
[facility] PROGRESS NOTE   [name] GEX:[id number] DOB: 1956-04-06 DOA: 03/04/2021  PCP: [name], DO  Brief History/Interval Summary: 65 y.o. male with medical history significant for type 2 diabetes, HLD, CKD stage IIIa, prostate cancer who presented to the ED for persistent headache and change in vision.  Initially presented on 11/24.  MRI brain was limited evaluation due to patient's intolerance.  Patient was discharged home.  Came back with persistent visual deficits.  Repeat MRI showed an acute stroke in the occipital lobe.  Patient was hospitalized for further management.  Patient seen by neurology.  His visual symptoms were concerning also for seizure activity.  EEG confirmed this.  Patient started on phenytoin.  Remains on long-term monitoring.  MRI brain to be repeated at some point.    Reason for Visit: Acute stroke  Consultants: Neurology  Procedures:  Transthoracic echocardiogram EEG   Subjective/Interval History: Overnight confusion again per wife - otherwise no event/issues. Patient 'much more awake/alert like his usual' per wife/family at bedside.  Assessment/Plan:  Acute metabolic encephalopathy, multifactorial:  Seizure, new onset, questionably provoked, POA Rule out acute right occipital stroke, POA Repeat MRI now showing no significant stroke - neuro following Likely new onset seizure, unclear provoking factor given no recent trauma/infection/similar  LP pending further discussion between family and neuro Currently on Phenobarbital, vimpat, phenytoin, and PRN alprazolam  Continue statin/aspirin. Neuro considering LP - plavix on hold   LDL is 77.  HbA1c is 13.5.  Urine drug screen was unremarkable. Echocardiogram shows normal systolic function.  No shunt was noted.  No embolic source.   HHPT at discharge EEG ongoing - questionable need for loop recorder given initial findings - defer this to neurology if still warranted.  Seizures,  ongoing as above Neuro following as above Continues on EEG this am - despite medications it appears he continues to have events.  Anxiety Xanax as needed given above - low dose and sparingly given age/mental status changes from baseline.  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  He was on metformin and Jardiance prior to admission.   CBGs seem to be reasonably well controlled here in the hospital.  Just on SSI.  Continue to monitor.    Chronic kidney disease stage IIIa/metabolic acidosis, stable Unknown baseline but labs appear stable   Essential hypertension/hyponatremia Holding ACE inhibitor.  Allowing some degree of permissive hypertension.  Patient is reasonably well controlled. Sodium level is stable.  Hyperlipidemia Continue statin.    DVT Prophylaxis: Subcutaneous heparin Code Status: Full code Family Communication: Discussed with patient's wife, son, grand-daughter at bedside Disposition Plan: Hopefully return home when improved pending clinical course  Status is: Inpatient  Remains inpatient appropriate because: Stroke, acute seizures  Medications: Scheduled:  aspirin  81 mg Oral Daily   atorvastatin  20 mg Oral QHS   insulin aspart  0-9 Units Subcutaneous TID WC   pantoprazole  40 mg Oral q morning   phenobarbital  64.8 mg Oral Daily   phenytoin (DILANTIN) IV  100 mg Intravenous Q8H   vitamin B-12  500 mcg Oral Daily   Continuous:  lacosamide (VIMPAT) IV 200 mg (03/07/21 2154)   WNU:[id number] **OR** acetaminophen (TYLENOL) oral liquid 160 mg/5 mL **OR** acetaminophen, ALPRAZolam, labetalol, ondansetron (ZOFRAN) IV, senna-docusate  Antibiotics: Anti-infectives (From admission, onward)    None       Objective:  Vital Signs  Vitals:   03/07/21 1604 03/07/21 2016  03/07/21 2335 03/08/21 0355  BP:  118/80 140/89 115/85  Pulse: 82 92 88 97  Resp:  17 19 17   Temp:  97.8 F (36.6  C) 98.4 F (36.9 C) 98.3 F (36.8 C)  TempSrc:  Oral Oral Oral  SpO2: 97% 94% 100% 96%  Weight:      Height:       No intake or output data in the 24 hours ending 03/08/21 0739  Filed Weights   03/04/21 2040  Weight: 86.2 kg    General appearance: Awake alert.  In no distress.  Somewhat distracted but more appropriately answering questions Resp: Clear to auscultation bilaterally.  Normal effort Cardio: S1-S2 is normal regular.  No S3-S4.  No rubs murmurs or bruit GI: Abdomen is soft.  Nontender nondistended.  Bowel sounds are present normal.  No masses organomegaly Extremities: No edema.  Moving all of his extremities Neurologic: Slightly distracted.  Oriented to year or month.  No focal neurological deficits.    Lab Results:  Data Reviewed: I have personally reviewed following labs and imaging studies  CBC: Recent Labs  Lab 03/02/21 1616 03/04/21 1223 03/05/21 0216 03/06/21 0156 03/08/21 0359  WBC 7.6 8.7 6.9 5.4 4.6  NEUTROABS 6.1 6.7  --   --   --   HGB 14.8 16.3 14.3 15.1 15.9  HCT 43.6 49.1 41.4 44.1 45.4  MCV 87.4 89.3 84.8 84.3 83.5  PLT 212 238 221 219 186     Basic Metabolic Panel: Recent Labs  Lab 03/04/21 1223 03/05/21 0216 03/06/21 0156 03/07/21 0102 03/08/21 0359  NA 136 131* 136 134* 130*  K 4.3 4.1 3.5 4.2 4.0  CL 105 103 108 104 102  CO2 13* 14* 17* 18* 20*  GLUCOSE 200* 213* 105* 227* 193*  BUN 35* 34* 29* 22 16  CREATININE 1.81* 1.81* 1.54* 1.57* 1.49*  CALCIUM 9.9 9.0 9.5 9.7 9.1  MG  --   --   --   --  1.8     GFR: Estimated Creatinine Clearance: 52.8 mL/min (A) (by C-G formula based on SCr of 1.49 mg/dL (H)).  Liver Function Tests: Recent Labs  Lab 03/02/21 1616 03/07/21 0102  AST 12* 15  ALT 21 17  ALKPHOS 86 83  BILITOT 0.9 0.9  PROT 7.4 6.1*  ALBUMIN 4.6 3.7     Recent Labs  Lab 03/02/21 1616  LIPASE 37     Coagulation Profile: Recent Labs  Lab 03/04/21 1223  INR 0.9      CBG: Recent Labs  Lab  03/07/21 1043 03/07/21 1228 03/07/21 1614 03/07/21 2118 03/08/21 0607  GLUCAP 313* 259* 158* 125* 202*     Lipid Profile: No results for input(s): CHOL, HDL, LDLCALC, TRIG, CHOLHDL, LDLDIRECT in the last 72 hours.    Recent Results (from the past 240 hour(s))  Resp Panel by RT-PCR (Flu A&B, Covid) Nasopharyngeal Swab     Status: None   Collection Time: 03/02/21  8:57 PM   Specimen: Nasopharyngeal Swab; Nasopharyngeal(NP) swabs in vial transport medium  Result Value Ref Range Status   SARS Coronavirus 2 by RT PCR NEGATIVE NEGATIVE Final    Comment: (NOTE) SARS-CoV-2 target nucleic acids are NOT DETECTED.  The SARS-CoV-2 RNA is generally detectable in upper respiratory specimens during the acute phase of infection. The lowest concentration of SARS-CoV-2 viral copies this assay can detect is 138 copies/mL. A negative result does not preclude SARS-Cov-2 infection and should not be used as the sole basis for treatment or  other patient management decisions. A negative result may occur with  improper specimen collection/handling, submission of specimen other than nasopharyngeal swab, presence of viral mutation(s) within the areas targeted by this assay, and inadequate number of viral copies(<138 copies/mL). A negative result must be combined with clinical observations, patient history, and epidemiological information. The expected result is Negative.  Fact Sheet for Patients:  EntrepreneurPulse.com.au  Fact Sheet for Healthcare Providers:  IncredibleEmployment.be  This test is no t yet approved or cleared by the [place] FDA and  has been authorized for detection and/or diagnosis of SARS-CoV-2 by FDA under an Emergency Use Authorization (EUA). This EUA will remain  in effect (meaning this test can be used) for the duration of the COVID-19 declaration under Section 564(b)(1) of the Act, 21 U.S.C.section 360bbb-3(b)(1), unless the  authorization is terminated  or revoked sooner.       Influenza A by PCR NEGATIVE NEGATIVE Final   Influenza B by PCR NEGATIVE NEGATIVE Final    Comment: (NOTE) The Xpert Xpress SARS-CoV-2/FLU/RSV plus assay is intended as an aid in the diagnosis of influenza from Nasopharyngeal swab specimens and should not be used as a sole basis for treatment. Nasal washings and aspirates are unacceptable for Xpert Xpress SARS-CoV-2/FLU/RSV testing.  Fact Sheet for Patients: EntrepreneurPulse.com.au  Fact Sheet for Healthcare Providers: IncredibleEmployment.be  This test is not yet approved or cleared by the [place] FDA and has been authorized for detection and/or diagnosis of SARS-CoV-2 by FDA under an Emergency Use Authorization (EUA). This EUA will remain in effect (meaning this test can be used) for the duration of the COVID-19 declaration under Section 564(b)(1) of the Act, 21 U.S.C. section 360bbb-3(b)(1), unless the authorization is terminated or revoked.  Performed at [facility], [address], [place], [place] [address]        Radiology Studies: MR BRAIN WO CONTRAST  Result Date: 03/07/2021 CLINICAL DATA:  Chronic seizures. History of trauma. EEG with evidence of seizures arising from the right temporoparietal region. EXAM: MRI HEAD WITHOUT CONTRAST TECHNIQUE: Multiplanar, multiecho pulse sequences of the brain and surrounding structures were obtained without intravenous contrast. COMPARISON:  Head MRI 03/04/2021 FINDINGS: Despite being premedicated, the patient could not tolerate the complete examination. A complete noncontrast seizure protocol MRI was obtained with the exception of thin section coronal FLAIR imaging. IV contrast was not administered. Multiple sequences are mildly to moderately motion degraded. Brain: There is persistent cortically based trace diffusion weighted signal hyperintensity in the medial right occipital lobe, and  there is now also asymmetric diffusion weighted signal and subtle T2 hyperintensity in the mesial right temporal lobe, particularly the hippocampus. More subtle cortical diffusion abnormality is questioned elsewhere in the right temporal, occipital, and parietal lobes although this may be artifactual due to motion. There is also subcortical white matter T2 hypointensity in the medial right occipital and right temporal lobes. The ventricles and sulci are normal. No intracranial hemorrhage, mass, midline shift, or extra-axial fluid collection is identified. A few small T2 hyperintensities in the cerebral white matter bilaterally elsewhere are unchanged and nonspecific but compatible with minimal chronic small vessel ischemic disease. A dilated perivascular space or a punctate chronic infarct in the midline of the pons is unchanged. Vascular: Major intracranial vascular flow voids are preserved. Skull and upper cervical spine: Unremarkable bone marrow signal. Sinuses/Orbits: Unremarkable orbits. Clear paranasal sinuses. Trace bilateral mastoid effusions. Other: None. IMPRESSION: 1. Motion degraded, incomplete examination. 2. Abnormal cortically based diffusion signal in the occipital and temporal lobes,  greater in extent than on the recent prior MRI and favored to reflect the sequelae of recent seizure activity although a component of acute ischemia is also possible. Subcortical T2 hypointensity in these regions is of indeterminate etiology but has been reported with nonketotic hyperglycemia and occipital seizures as well as ischemia and encephalitis. Electronically Signed   By: Logan Bores M.D.   On: 03/07/2021 15:46   Overnight EEG with video  Result Date: 03/06/2021 Lora Havens, MD     03/07/2021  8:56 AM Patient Name: [name] MRN: [id number] Epilepsy Attending: Lora Havens Referring Physician/Provider: Dr Rosalin Hawking Duration: 03/05/2021 1746 to11/28/2022 1746  Patient history: 65yo M with  right PCA stroke and visual hallucinations. EEG to evaluate for seizure.  Level of alertness: Awake, asleep  AEDs during EEG study: PHT, Clonazepam  Technical aspects: This EEG study was done with scalp electrodes positioned according to the 10-20 International system of electrode placement. Electrical activity was acquired at a sampling rate of 500Hz  and reviewed with a high frequency filter of 70Hz  and a low frequency filter of 1Hz . EEG data were recorded continuously and digitally stored.  Description: No clear posterior dominant rhythm consists of 8 Hz activity of moderate voltage (25-35 uV) seen predominantly in posterior head regions, symmetric and reactive to eye opening and eye closing. Sleep was characterized by vertex waves, sleep spindles (12 to 14 Hz), maximal frontocentral region.  22 seizure without clinical signs were noted during this study arising from right temporo-parietal region. Average duration 30 seconds. Last seizure was noted on 03/06/2021 at 1537. During seizure, eeg showed 5-6Hz  theta slowing in right temporo-parietal region which then involved all of right frontotemporal region as well as left frontal region and evolved into 3-4hz  delta slowing. EEG also showed lateralized periodic discharges in right hemisphere, maximal right temporoparietal region at 1 Hz which at times appeared rhythmic consistent with brief ictal-interictal rhythmic discharges  Hyperventilation and photic stimulation were not performed.    ABNORMALITY - Seizure without clinical signs, right temporo-parietal region. -Brief ictal-interictal rhythmic discharges, right hemisphere, maximal right temporoparietal region -Lateralized paretic discharges,right hemisphere, maximal right temporoparietal region  IMPRESSION: This study showed 22 seizure without clinical signs arising nfrom right temporo-parietal region. Average duration 30 seconds. Last seizure was noted on 03/06/2021 at 1537. Additionally, EEG showed evidence of  cortical dysfunction and epileptogenicity arising from right hemisphere, maximal right temporoparietal region likely due to underlying structural abnormality with high potential for seizure recurrence.  [place]       LOS: 3 days   Little Ishikawa  [facility] Pager on [email or web address]  03/08/2021, 7:39 AM

## 2021-03-08 NOTE — Procedures (Signed)
Procedure Note: Lumbar puncture  Indications: assess for CNS pathology   Operator: Chay Mazzoni de Yolanda Manges, NP  Others present: Rosalin Hawking, MD, PhD  Indications, risks, and benefits explained to patient / surrogate decision maker and informed consent obtained. Time-out was performed, with all individuals present agreeing on the procedure to be performed, the site of procedure, and the patient identity.  Patient positioned, prepped and draped in usual sterile fashion. L3-4 space located using bilateral iliac crests as landmarks. 1% Lidocaine without epinephrine was used to anesthetize the area. An 20G spinal needle was introduced into the sub- arachnoid space. Stylet was removed with appropriate fluid return. Needle removed after adequate fluid collected. Blood loss was minimal. A dry gauze dressing was placed over insertion site. Patient tolerated the procedure well and no complications were observed. Spontaneous movement of bilateral extremities were observed after the procedure.  Opening pressure:  not measured  Total fluid removed: 15 cc  Color of fluid: clear  Sent for: cell count + diff , gram stain, cultures, glucose, protein, crypto antigen, HSV PCR, VDRL, oligoclonal bands

## 2021-03-08 NOTE — Progress Notes (Signed)
EEG maintenance performed.  Reapplied multiple electrodes.  No skin breakdown observed at electrode sites A1,A2, F7, F8, P3, T8.

## 2021-03-08 NOTE — Progress Notes (Signed)
PT Cancellation Note  Patient Details Name: [name] MRN: [id number] DOB: Jun 26, 1955   Cancelled Treatment:    Reason Eval/Treat Not Completed: Medical issues which prohibited therapy. Discussed pt case with RN who reports episodes of agitation and difficulty redirecting with OOB mobility. RN also reports lumbar puncture pending and asks that PT checks back another date. Of note, PT originally recommending Outpatient PT follow up at d/c. We have not been able to progress mobility since initial eval and anticipate pt will be more appropriate for Home Health PT services, however will continue to assess for most appropriate d/c disposition with each treatment session.    Thelma Comp 03/08/2021, 2:24 PM  Rolinda Roan, PT, DPT Acute Rehabilitation Services Pager: [phone] Office: [phone]

## 2021-03-08 NOTE — Progress Notes (Signed)
OT Cancellation Note  Patient Details Name: [name] MRN: [id number] DOB: Feb 27, 1956   Cancelled Treatment:    Reason Eval/Treat Not Completed: Medical issues which prohibited therapy (MDs rounding on pt, and RN asked that OT treatment f/u at another time as pt has been agitated and difficult to re-direct today.) OT originally recommending OP OT based on initially evaluation; D/c recommendation updated to home health OT.   [name] [name] 03/08/2021, 2:47 PM

## 2021-03-09 ENCOUNTER — Encounter (HOSPITAL_COMMUNITY): Payer: Self-pay | Admitting: Internal Medicine

## 2021-03-09 ENCOUNTER — Inpatient Hospital Stay (HOSPITAL_COMMUNITY): Payer: Medicare Other

## 2021-03-09 DIAGNOSIS — E785 Hyperlipidemia, unspecified: Secondary | ICD-10-CM | POA: Diagnosis not present

## 2021-03-09 DIAGNOSIS — R569 Unspecified convulsions: Secondary | ICD-10-CM | POA: Diagnosis not present

## 2021-03-09 DIAGNOSIS — E1169 Type 2 diabetes mellitus with other specified complication: Secondary | ICD-10-CM | POA: Diagnosis not present

## 2021-03-09 DIAGNOSIS — N1831 Chronic kidney disease, stage 3a: Secondary | ICD-10-CM | POA: Diagnosis not present

## 2021-03-09 DIAGNOSIS — E1122 Type 2 diabetes mellitus with diabetic chronic kidney disease: Secondary | ICD-10-CM | POA: Diagnosis not present

## 2021-03-09 DIAGNOSIS — I639 Cerebral infarction, unspecified: Secondary | ICD-10-CM | POA: Diagnosis not present

## 2021-03-09 LAB — BASIC METABOLIC PANEL
Anion gap: 10 (ref 5–15)
BUN: 14 mg/dL (ref 8–23)
CO2: 18 mmol/L — ABNORMAL LOW (ref 22–32)
Calcium: 9.1 mg/dL (ref 8.9–10.3)
Chloride: 103 mmol/L (ref 98–111)
Creatinine, Ser: 1.4 mg/dL — ABNORMAL HIGH (ref 0.61–1.24)
GFR, Estimated: 56 mL/min — ABNORMAL LOW (ref >60)
Glucose, Bld: 218 mg/dL — ABNORMAL HIGH (ref 70–99)
Potassium: 3.9 mmol/L (ref 3.5–5.1)
Sodium: 131 mmol/L — ABNORMAL LOW (ref 135–145)

## 2021-03-09 LAB — GLUCOSE, CAPILLARY
Glucose-Capillary: 246 mg/dL — ABNORMAL HIGH (ref 70–99)
Glucose-Capillary: 242 mg/dL — ABNORMAL HIGH (ref 70–99)
Glucose-Capillary: 244 mg/dL — ABNORMAL HIGH (ref 70–99)
Glucose-Capillary: 243 mg/dL — ABNORMAL HIGH (ref 70–99)

## 2021-03-09 LAB — PHENOBARBITAL LEVEL: Phenobarbital: 18.5 ug/mL (ref 15.0–30.0)

## 2021-03-09 LAB — THYROGLOBULIN ANTIBODY: Thyroglobulin Antibody: <1 IU/mL (ref 0.0–0.9)

## 2021-03-09 LAB — THYROID PEROXIDASE ANTIBODY: Thyroperoxidase Ab SerPl-aCnc: <9 IU/mL (ref 0–34)

## 2021-03-09 LAB — PHENYTOIN LEVEL, TOTAL: Phenytoin Lvl: 14.2 ug/mL (ref 10.0–20.0)

## 2021-03-09 LAB — VDRL, CSF: VDRL Quant, CSF: NONREACTIVE

## 2021-03-09 IMAGING — CT CT CHEST-ABD-PELV W/ CM
2 of 5 series · 13 of 36 positions shown, 15 images · IV contrast (omnipaque)
Comparison: None.

CLINICAL DATA: Cancer of unknown primary

EXAM:
CT CHEST, ABDOMEN, AND PELVIS WITH CONTRAST
TECHNIQUE: Multidetector CT imaging of the chest, abdomen and pelvis was
performed following the standard protocol during bolus
administration of intravenous contrast.
CONTRAST:  100mL OMNIPAQUE IOHEXOL 300 MG/ML  SOLN

[Series 3: cap 5.0 i31f 2 · axial · 0.98mm/px · z∈[+1058,+1598]mm · 10 of 134 slices shown, 12 images]
[im 13/134  mediastinal]
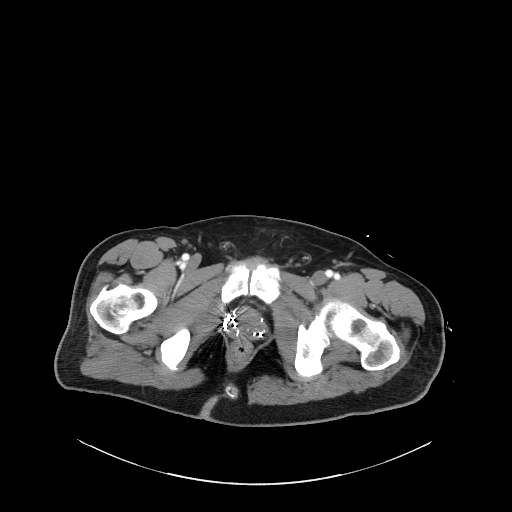
[im 13/134  bone]
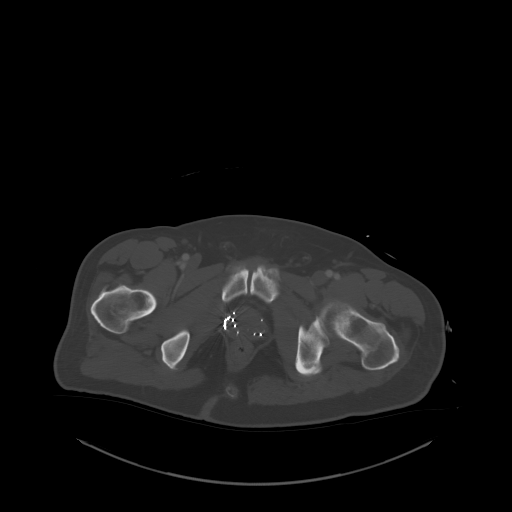
[im 25/134  mediastinal]
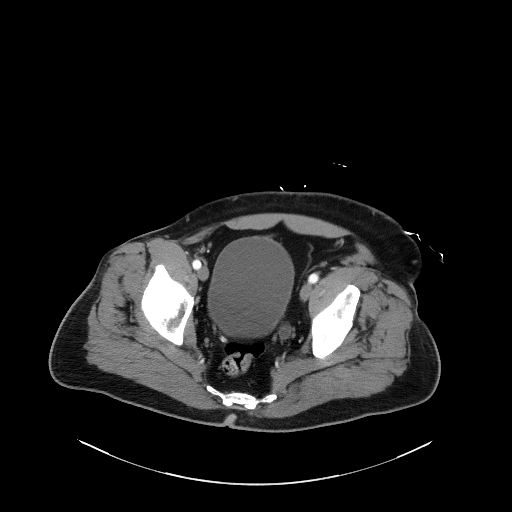
[im 37/134  mediastinal]
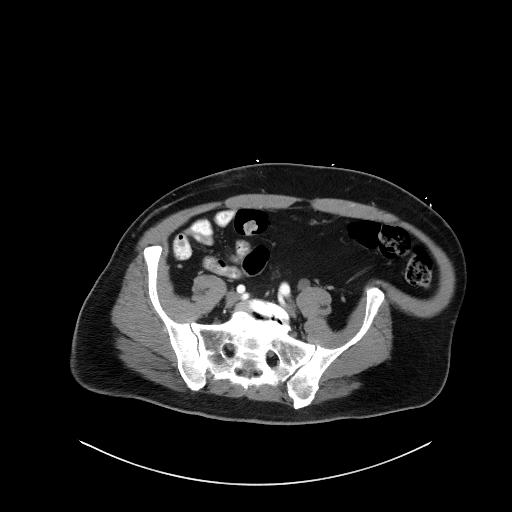
[im 49/134  mediastinal]
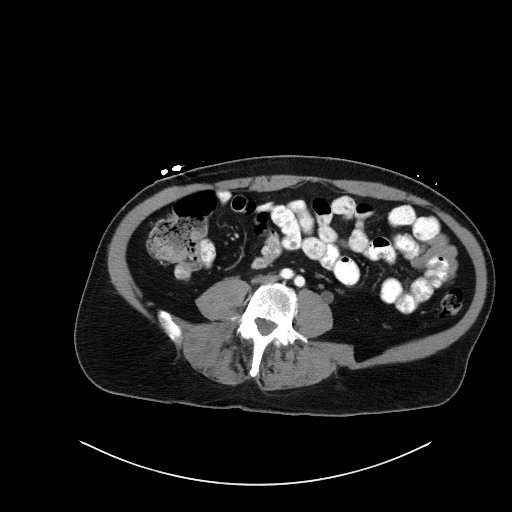
[im 61/134  mediastinal]
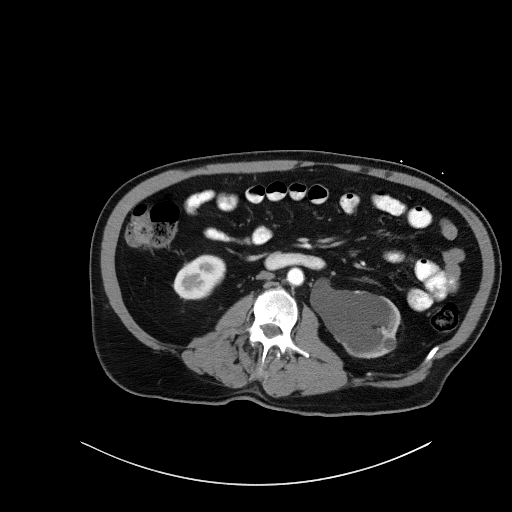
[im 73/134  mediastinal]
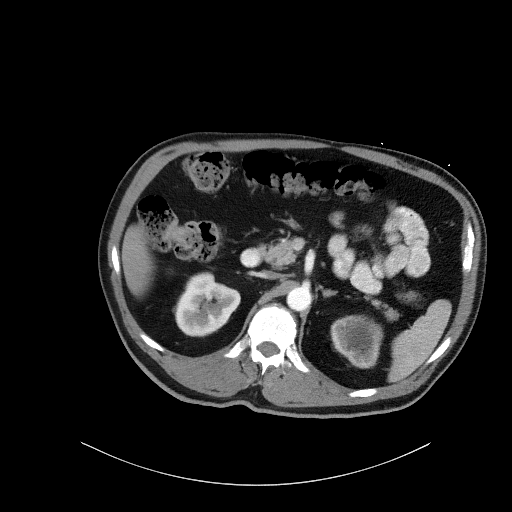
[im 85/134  mediastinal]
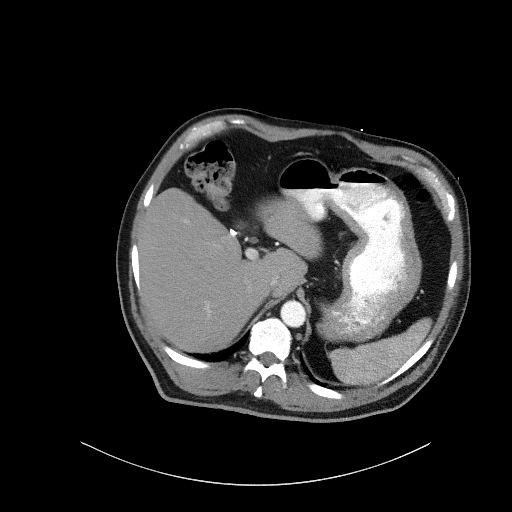
[im 97/134  mediastinal]
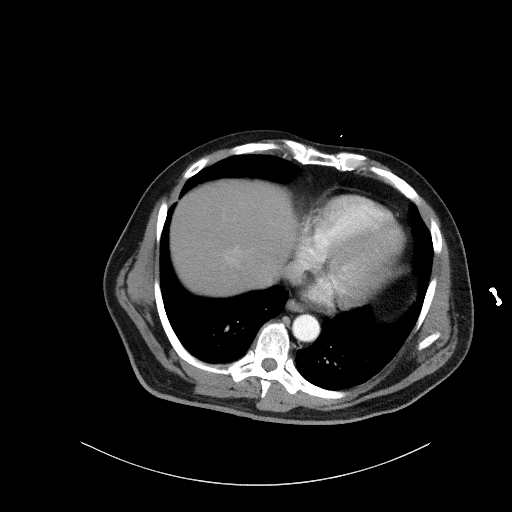
[im 109/134  mediastinal]
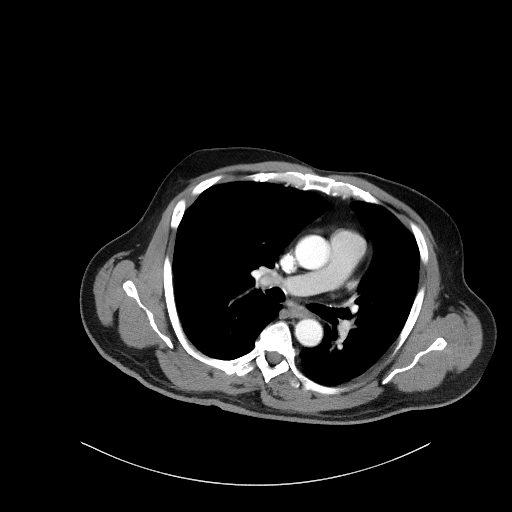
[im 109/134  bone]
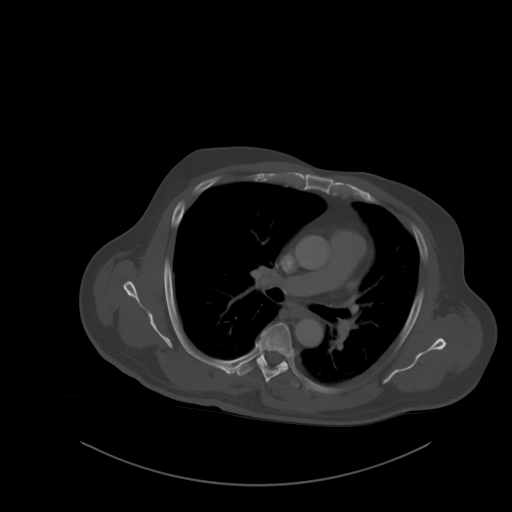
[im 121/134  mediastinal]
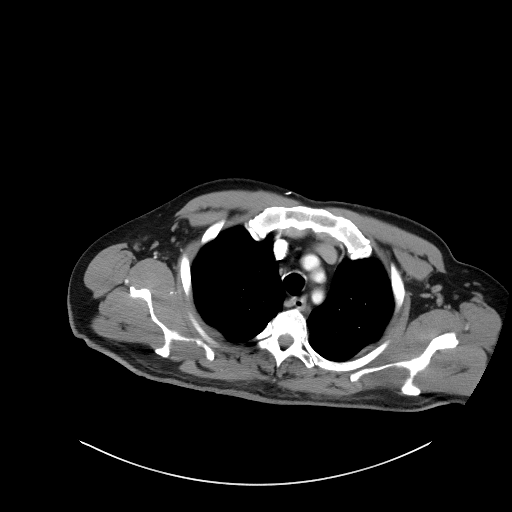

[Series 6: coronal · coronal · 0.90mm/px · 3 of 178 slices shown]
[im 36/178  mediastinal]
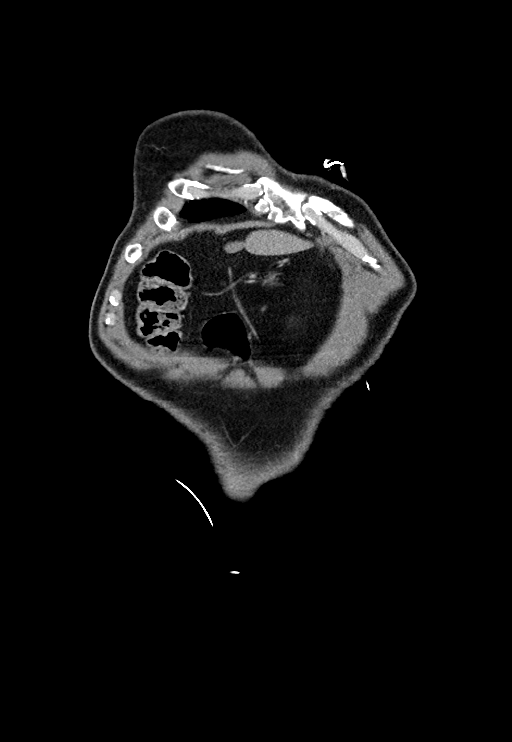
[im 71/178  mediastinal]
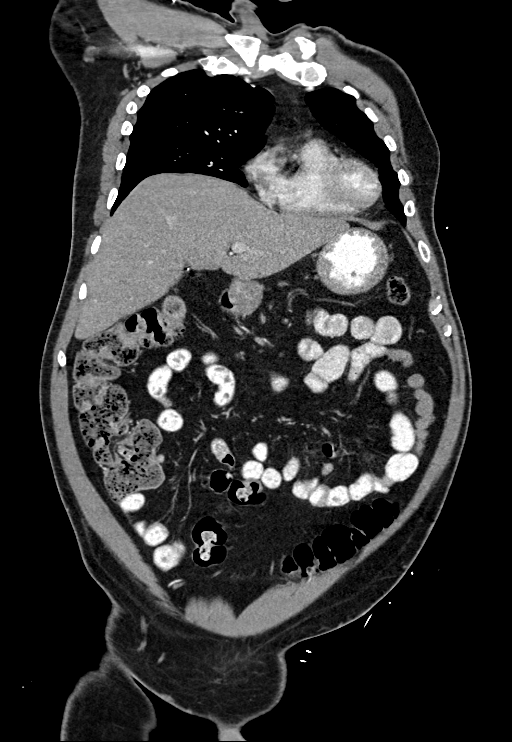
[im 107/178  mediastinal]
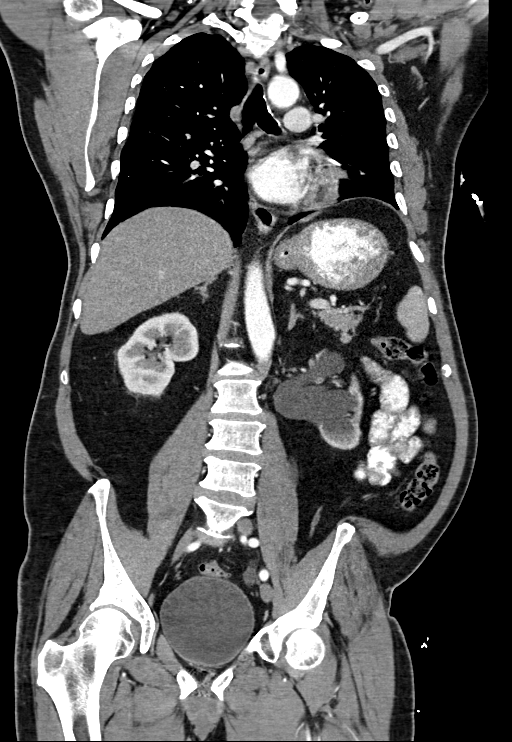

[13 of 36 positions shown; findings below may reference images not displayed]

FINDINGS: CT CHEST FINDINGS

Cardiovascular: Heart size is normal. No pericardial effusion
identified. Main pulmonary artery is normal caliber. Thoracic aorta
is normal in caliber with mild atherosclerotic plaques.

Mediastinum/Nodes: No bulky axillary, mediastinal or hilar
lymphadenopathy identified.

Lungs/Pleura: No focal consolidation identified in the lungs. Mild
breathing motion and subsegmental atelectatic changes. No pleural
effusion or pneumothorax.

Musculoskeletal: No suspicious bony lesions identified in the chest.

CT ABDOMEN PELVIS FINDINGS

Hepatobiliary: Liver is normal in size and contour. There is a
cm peripherally enhancing lesion in the right hepatic lobe near the
dome and a 1.4 cm faint ill-defined hypodensity in segment 4B.
Gallbladder is surgically absent. No biliary ductal dilatation.

Pancreas: Unremarkable. No pancreatic ductal dilatation or
surrounding inflammatory changes.

Spleen: Normal in size without focal abnormality.

Adrenals/Urinary Tract: Adrenal glands appear normal. Normal
perfusion and excretion of the right kidney and diminished perfusion
and delayed excretion of the left kidney. Severe left
hydroureteronephrosis secondary to a large obstructing calculus at
the UVJ measuring 15 x 10 mm. Note is also made of a 4 mm calculus
just proximally within the distal ureter. Associated left renal
cortical thinning without significant perinephric fat stranding. A
few renal cortical cysts identified measuring up to 3.2 cm on the
right. Urinary bladder appears otherwise within normal limits.

Stomach/Bowel: No bowel obstruction, free air or pneumatosis. No
bowel wall edema or thickening identified. Moderate amount of
retained fecal material throughout the colon. Appendix is normal

Vascular/Lymphatic: No significant vascular findings are present. No
enlarged abdominal or pelvic lymph nodes.

Reproductive: Brachytherapy seeds within the prostate gland.

Other: Small umbilical hernia containing fat.  No ascites.

Musculoskeletal: No suspicious bony lesions identified. Degenerative
changes in the lower lumbar spine.
IMPRESSION: 1. No suspicious mass or lymphadenopathy identified in the chest.
[DATE] cm peripherally enhancing right hepatic lobe lesion which is
indeterminate, could represent hemangioma or metastasis given the
history. Faint hepatic hypodensity in segment 4B could represent
focal fatty infiltration. Consider follow-up hepatic protocol MRI.
3. Otherwise no suspicious mass or lymphadenopathy in the abdomen or
pelvis.
4. Large likely chronically obstructing calculus in the distal left
ureter at the UVJ. Severe left hydroureteronephrosis with associated
renal cortical thinning and relatively diminished perfusion.

## 2021-03-09 MED ORDER — MELATONIN 5 MG PO TABS
10.0000 mg | ORAL_TABLET | Freq: Every day | ORAL | Status: DC
  Administered 2021-03-09 – 2021-03-12 (×4): 10 mg via ORAL
  Filled 2021-03-09 (×4): qty 2

## 2021-03-09 MED ORDER — BARIUM SULFATE 2.1 % PO SUSP
ORAL | Status: AC
  Filled 2021-03-09: qty 2

## 2021-03-09 MED ORDER — DIVALPROEX SODIUM 250 MG PO DR TAB
500.0000 mg | DELAYED_RELEASE_TABLET | Freq: Two times a day (BID) | ORAL | Status: DC
  Administered 2021-03-09 – 2021-03-13 (×8): 500 mg via ORAL
  Filled 2021-03-09 (×8): qty 2

## 2021-03-09 MED ORDER — IOHEXOL 300 MG/ML  SOLN
100.0000 mL | Freq: Once | INTRAMUSCULAR | Status: AC | PRN
  Administered 2021-03-09: 100 mL via INTRAVENOUS

## 2021-03-09 MED ORDER — LACOSAMIDE 200 MG PO TABS
200.0000 mg | ORAL_TABLET | Freq: Two times a day (BID) | ORAL | Status: DC
  Administered 2021-03-09 – 2021-03-13 (×8): 200 mg via ORAL
  Filled 2021-03-09 (×8): qty 1

## 2021-03-09 NOTE — Progress Notes (Signed)
Inpatient Diabetes Program Recommendations  AACE/ADA: New Consensus Statement on Inpatient Glycemic Control (2015)  Target Ranges:  Prepandial:   less than 140 mg/dL      Peak postprandial:   less than 180 mg/dL (1-2 hours)      Critically ill patients:  140 - 180 mg/dL   Lab Results  Component Value Date   GLUCAP 246 (H) 03/09/2021   HGBA1C 13.5 (H) 03/04/2021    Review of Glycemic Control  Latest Reference Range & Units 03/08/21 06:07 03/08/21 12:21 03/08/21 16:03 03/08/21 21:46 03/09/21 07:34  Glucose-Capillary 70 - 99 mg/dL 202 (H) 259 (H) 227 (H) 241 (H) 246 (H)  (H): Data is abnormally high  Diabetes history: DM Outpatient Diabetes medications: Jardiance 25 mg QD, Metformin 500 mg BID Current orders for Inpatient glycemic control: Novolog 0-9 units TID  Inpatient Diabetes Program Recommendations:    Consider Semglee 10 units QD  Will continue to follow while inpatient.  Thank you, Reche Dixon, RN, BSN Diabetes Coordinator Inpatient Diabetes Program [phone] (team pager from 8a-5p)

## 2021-03-09 NOTE — Procedures (Signed)
Patient Name: [name]  MRN: [id number]  Epilepsy Attending: Lora Havens  Referring Physician/Provider: Dr Rosalin Hawking Duration: 03/08/2021 1746 to 02/07/2021 0015   Patient history: 65yo M with right PCA stroke and visual hallucinations. EEG to evaluate for seizure.    Level of alertness: Awake, asleep   AEDs during EEG study: PHT,Phenobarb,Vimpat, Clonazepam   Technical aspects: This EEG study was done with scalp electrodes positioned according to the 10-20 International system of electrode placement. Electrical activity was acquired at a sampling rate of 500Hz  and reviewed with a high frequency filter of 70Hz  and a low frequency filter of 1Hz . EEG data were recorded continuously and digitally stored.    Description: No clear posterior dominant rhythm consists of 8 Hz activity of moderate voltage (25-35 uV) seen predominantly in posterior head regions, symmetric and reactive to eye opening and eye closing. Sleep was characterized by vertex waves, sleep spindles (12 to 14 Hz), maximal frontocentral region.     EEG also showed lateralized periodic discharges in right hemisphere, maximal right temporoparietal region at 1 Hz which at times appeared rhythmic consistent with brief ictal-interictal rhythmic discharges.   Hyperventilation and photic stimulation were not performed.     Patient pulled up on his electrodes after around midnight and therefore rest of the study was not interpretable   ABNORMALITY -Brief ictal-interictal rhythmic discharges, right hemisphere, maximal right temporoparietal region  -Lateralized paretic discharges,right hemisphere, maximal right temporoparietal region    IMPRESSION: This study showed evidence of cortical dysfunction and epileptogenicity arising from right hemisphere, maximal right temporoparietal region likely due to underlying structural abnormality.  Brief ictal-interictal rhythmic discharges were also noted in right hemisphere, maximal right  temporoparietal region which are on the ictal-interictal continuum, with high potential for seizure recurrence.   [name]

## 2021-03-09 NOTE — Progress Notes (Addendum)
[facility] PROGRESS NOTE   [name] LYY:[id number] DOB: 22-May-1955 DOA: 03/04/2021  PCP: [name], DO  Brief History/Interval Summary: 65 y.o. male with medical history significant for type 2 diabetes, HLD, CKD stage IIIa, prostate cancer who presented to the ED for persistent headache and change in vision.  Initially presented on 11/24.  MRI brain was limited evaluation due to patient's intolerance.  Patient was discharged home.  Came back with persistent visual deficits.  Repeat MRI showed an acute stroke in the occipital lobe.  Patient was hospitalized for further management.  Patient seen by neurology.  His visual symptoms were concerning also for seizure activity.  EEG confirmed this.  Patient started on phenytoin.  Remains on long-term monitoring.  MRI brain to be repeated at some point.  Reason for Visit: Acute stroke  Consultants: Neurology  Procedures:  Transthoracic echocardiogram EEG  Subjective/Interval History: Overnight confusion and itching per wife, there was concern of rash overnight on patient's back and abdomen that appears to be sparing of the limbs which had resolved for the most part before rounds this morning.  Patient indicates itching has subsided after bath otherwise denies nausea vomiting diarrhea constipation any fevers chills or chest pain  Assessment/Plan:  Acute metabolic encephalopathy, multifactorial:  Seizure, new onset, questionably provoked, POA Rule out acute right occipital stroke, POA Repeat MRI now showing no significant stroke - neuro following Likely new onset seizure, unclear provoking factor given no recent trauma/infection/similar  LP 03/08/21 - labs pending Currently on Phenobarbital, vimpat, phenytoin, and PRN alprazolam  Continue statin/aspirin. EEG continues to show ongoing epileptogenicity as below  Seizures, ongoing as above Neuro following as above EEG continues to show ongoing epileptogenicity with  ictal/interictal rhythmic discharges per neurology  Abnormal liver lesion Discussed case w/ Dr Kale(Oncology) - will follow repeat PSA and get biopsy if possible while inpatient - otherwise outpatient follow up is reasonable -No indication for repeat imaging at this time  Chronic ureter obstruction -Discussed with Urology - given likely chronic, asymptomatic, and non-infected stone can follow up outpatient for further evaluation.  Anxiety Xanax as needed given above - low dose and sparingly given age/mental status changes from baseline.  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   He was on metformin and Jardiance prior to admission -wife indicates he was moderately noncompliant with these medications Family requesting to be discharged on previous home regiment with improved diet which we discussed would be difficult given his elevated A1c but if he can adjust his diet appropriately and see his PCP within 5 days of discharge it may be a reasonable request.  Chronic kidney disease stage IIIa/metabolic acidosis, stable Unknown baseline but labs appear stable   Essential hypertension/hyponatremia Holding ACE inhibitor.  Allowing some degree of permissive hypertension.  Patient is reasonably well controlled. Sodium level is stable.  Hyperlipidemia Continue statin.    DVT Prophylaxis: Subcutaneous heparin Code Status: Full code Family Communication: Discussed with patient's wife, son, grand-daughter at bedside Disposition Plan: Hopefully return home when improved pending clinical course  Status is: Inpatient  Remains inpatient appropriate because: Stroke, acute seizures  Medications: Scheduled:  aspirin  81 mg Oral Daily   atorvastatin  20 mg Oral QHS   enoxaparin (LOVENOX) injection  40 mg Subcutaneous Q24H   insulin aspart  0-9 Units Subcutaneous TID WC   lidocaine  5 mL Intradermal Once   lidocaine  5 mL  Intradermal Once  pantoprazole  40 mg Oral q morning   phenobarbital  64.8 mg Oral BID   phenytoin (DILANTIN) IV  100 mg Intravenous Q8H   vitamin B-12  500 mcg Oral Daily   Continuous:  lacosamide (VIMPAT) IV Stopped (03/08/21 2156)   MVH:[id number] **OR** acetaminophen (TYLENOL) oral liquid 160 mg/5 mL **OR** acetaminophen, ALPRAZolam, labetalol, ondansetron (ZOFRAN) IV, senna-docusate  Antibiotics: Anti-infectives (From admission, onward)    None       Objective:  Vital Signs  Vitals:   03/08/21 1631 03/08/21 2002 03/08/21 2340 03/09/21 0404  BP: 124/66 129/78 124/62 (!) 114/99  Pulse: 86 88 87 89  Resp: 17 16 14 16   Temp: 98.2 F (36.8 C) 97.9 F (36.6 C) 98.3 F (36.8 C) 98.6 F (37 C)  TempSrc: Oral Oral Oral Oral  SpO2: 100%  99% 98%  Weight:      Height:        Intake/Output Summary (Last 24 hours) at 03/09/2021 0750 Last data filed at 03/08/2021 2300 Gross per 24 hour  Intake 505 ml  Output 300 ml  Net 205 ml    Filed Weights   03/04/21 2040  Weight: 86.2 kg    General appearance: Awake alert.  In no distress.  Somewhat distracted but more appropriately answering questions Resp: Clear to auscultation bilaterally.  Normal effort Cardio: S1-S2 is normal regular.  No S3-S4.  No rubs murmurs or bruit GI: Abdomen is soft.  Nontender nondistended.  Bowel sounds are present normal.  No masses organomegaly Extremities: No edema.  Moving all of his extremities Neurologic: Slightly distracted.  Oriented to year or month.  No focal neurological deficits.    Lab Results:  Data Reviewed: I have personally reviewed following labs and imaging studies  CBC: Recent Labs  Lab 03/02/21 1616 03/04/21 1223 03/05/21 0216 03/06/21 0156 03/08/21 0359  WBC 7.6 8.7 6.9 5.4 4.6  NEUTROABS 6.1 6.7  --   --   --   HGB 14.8 16.3 14.3 15.1 15.9  HCT 43.6 49.1 41.4 44.1 45.4  MCV 87.4 89.3 84.8 84.3 83.5  PLT 212 238 221 219 186     Basic Metabolic  Panel: Recent Labs  Lab 03/05/21 0216 03/06/21 0156 03/07/21 0102 03/08/21 0359 03/09/21 0054  NA 131* 136 134* 130* 131*  K 4.1 3.5 4.2 4.0 3.9  CL 103 108 104 102 103  CO2 14* 17* 18* 20* 18*  GLUCOSE 213* 105* 227* 193* 218*  BUN 34* 29* 22 16 14   CREATININE 1.81* 1.54* 1.57* 1.49* 1.40*  CALCIUM 9.0 9.5 9.7 9.1 9.1  MG  --   --   --  1.8  --      GFR: Estimated Creatinine Clearance: 56.2 mL/min (A) (by C-G formula based on SCr of 1.4 mg/dL (H)).  Liver Function Tests: Recent Labs  Lab 03/02/21 1616 03/07/21 0102  AST 12* 15  ALT 21 17  ALKPHOS 86 83  BILITOT 0.9 0.9  PROT 7.4 6.1*  ALBUMIN 4.6 3.7     Recent Labs  Lab 03/02/21 1616  LIPASE 37     Coagulation Profile: Recent Labs  Lab 03/04/21 1223  INR 0.9      CBG: Recent Labs  Lab 03/08/21 0607 03/08/21 1221 03/08/21 1603 03/08/21 2146 03/09/21 0734  GLUCAP 202* 259* 227* 241* 246*     Lipid Profile: No results for input(s): CHOL, HDL, LDLCALC, TRIG, CHOLHDL, LDLDIRECT in the last 72 hours.    Recent Results (from the past 240 hour(s))  Resp Panel by RT-PCR (Flu A&B, Covid) Nasopharyngeal Swab     Status: None   Collection Time: 03/02/21  8:57 PM   Specimen: Nasopharyngeal Swab; Nasopharyngeal(NP) swabs in vial transport medium  Result Value Ref Range Status   SARS Coronavirus 2 by RT PCR NEGATIVE NEGATIVE Final    Comment: (NOTE) SARS-CoV-2 target nucleic acids are NOT DETECTED.  The SARS-CoV-2 RNA is generally detectable in upper respiratory specimens during the acute phase of infection. The lowest concentration of SARS-CoV-2 viral copies this assay can detect is 138 copies/mL. A negative result does not preclude SARS-Cov-2 infection and should not be used as the sole basis for treatment or other patient management decisions. A negative result may occur with  improper specimen collection/handling, submission of specimen other than nasopharyngeal swab, presence of viral  mutation(s) within the areas targeted by this assay, and inadequate number of viral copies(<138 copies/mL). A negative result must be combined with clinical observations, patient history, and epidemiological information. The expected result is Negative.  Fact Sheet for Patients:  EntrepreneurPulse.com.au  Fact Sheet for Healthcare Providers:  IncredibleEmployment.be  This test is no t yet approved or cleared by the [place] FDA and  has been authorized for detection and/or diagnosis of SARS-CoV-2 by FDA under an Emergency Use Authorization (EUA). This EUA will remain  in effect (meaning this test can be used) for the duration of the COVID-19 declaration under Section 564(b)(1) of the Act, 21 U.S.C.section 360bbb-3(b)(1), unless the authorization is terminated  or revoked sooner.       Influenza A by PCR NEGATIVE NEGATIVE Final   Influenza B by PCR NEGATIVE NEGATIVE Final    Comment: (NOTE) The Xpert Xpress SARS-CoV-2/FLU/RSV plus assay is intended as an aid in the diagnosis of influenza from Nasopharyngeal swab specimens and should not be used as a sole basis for treatment. Nasal washings and aspirates are unacceptable for Xpert Xpress SARS-CoV-2/FLU/RSV testing.  Fact Sheet for Patients: EntrepreneurPulse.com.au  Fact Sheet for Healthcare Providers: IncredibleEmployment.be  This test is not yet approved or cleared by the [place] FDA and has been authorized for detection and/or diagnosis of SARS-CoV-2 by FDA under an Emergency Use Authorization (EUA). This EUA will remain in effect (meaning this test can be used) for the duration of the COVID-19 declaration under Section 564(b)(1) of the Act, 21 U.S.C. section 360bbb-3(b)(1), unless the authorization is terminated or revoked.  Performed at [facility], [address], [place], [place] [address]   CSF culture w Gram Stain     Status: None  (Preliminary result)   Collection Time: 03/08/21  4:00 PM   Specimen: CSF; Cerebrospinal Fluid  Result Value Ref Range Status   Specimen Description CSF  Final   Special Requests NONE  Final   Gram Stain   Final    WBC PRESENT, PREDOMINANTLY MONONUCLEAR NO ORGANISMS SEEN CYTOSPIN SMEAR Performed at [facility], [place] [address], [place], [place] [address]    Culture PENDING  Incomplete   Report Status PENDING  Incomplete       Radiology Studies: MR BRAIN WO CONTRAST  Result Date: 03/07/2021 CLINICAL DATA:  Chronic seizures. History of trauma. EEG with evidence of seizures arising from the right temporoparietal region. EXAM: MRI HEAD WITHOUT CONTRAST TECHNIQUE: Multiplanar, multiecho pulse sequences of the brain and surrounding structures were obtained without intravenous contrast. COMPARISON:  Head MRI 03/04/2021 FINDINGS: Despite being premedicated, the patient could not tolerate the complete examination. A complete noncontrast seizure protocol MRI was obtained with  the exception of thin section coronal FLAIR imaging. IV contrast was not administered. Multiple sequences are mildly to moderately motion degraded. Brain: There is persistent cortically based trace diffusion weighted signal hyperintensity in the medial right occipital lobe, and there is now also asymmetric diffusion weighted signal and subtle T2 hyperintensity in the mesial right temporal lobe, particularly the hippocampus. More subtle cortical diffusion abnormality is questioned elsewhere in the right temporal, occipital, and parietal lobes although this may be artifactual due to motion. There is also subcortical white matter T2 hypointensity in the medial right occipital and right temporal lobes. The ventricles and sulci are normal. No intracranial hemorrhage, mass, midline shift, or extra-axial fluid collection is identified. A few small T2 hyperintensities in the cerebral white matter bilaterally elsewhere are unchanged  and nonspecific but compatible with minimal chronic small vessel ischemic disease. A dilated perivascular space or a punctate chronic infarct in the midline of the pons is unchanged. Vascular: Major intracranial vascular flow voids are preserved. Skull and upper cervical spine: Unremarkable bone marrow signal. Sinuses/Orbits: Unremarkable orbits. Clear paranasal sinuses. Trace bilateral mastoid effusions. Other: None. IMPRESSION: 1. Motion degraded, incomplete examination. 2. Abnormal cortically based diffusion signal in the occipital and temporal lobes, greater in extent than on the recent prior MRI and favored to reflect the sequelae of recent seizure activity although a component of acute ischemia is also possible. Subcortical T2 hypointensity in these regions is of indeterminate etiology but has been reported with nonketotic hyperglycemia and occipital seizures as well as ischemia and encephalitis. Electronically Signed   By: Logan Bores M.D.   On: 03/07/2021 15:46       LOS: 4 days   [name]  [facility] Pager: Secure chat  03/09/2021, 7:50 AM

## 2021-03-09 NOTE — Progress Notes (Signed)
Physical Therapy Treatment Patient Details Name: [name] MRN: [id number] DOB: [birth year] Today's Date: 03/09/2021   History of Present Illness Pt is a 65 y/o male who presents to the ED for persistent HA and vision changes 11/24. He was d/c home and returned with persistent visual symptoms and behavior changes. MRI revealed R PCA infarct. EEG positive for seizures. PMH significant for DM II, CKD IIIa, prostate CA.    PT Comments    Patient progressing with ambulation distance and stability in session.  Initially significant LOB standing without UE support, but at end of session able to take side steps toward [name] without UE support and min A.  Wife present and eager for pt to go home for therapy.  States grandson can assist as well as other family members.  Educated her on level of support for ambulation.  PT will follow acutely.    Recommendations for follow up therapy are one component of a multi-disciplinary discharge planning process, led by the attending physician.  Recommendations may be updated based on patient status, additional functional criteria and insurance authorization.  Follow Up Recommendations  Home health PT     Assistance Recommended at Discharge Frequent or constant Supervision/Assistance  Equipment Recommendations  Rolling walker (2 wheels);BSC/3in1    Recommendations for Other Services       Precautions / Restrictions Precautions Precautions: Fall Precaution Comments: seizure Restrictions Weight Bearing Restrictions: No     Mobility  Bed Mobility Overal bed mobility: Needs Assistance Bed Mobility: Supine to Sit;Sit to Supine     Supine to sit: Min guard;HOB elevated Sit to supine: Min guard   General bed mobility comments: assist for balance    Transfers Overall transfer level: Needs assistance Equipment used: Rolling walker (2 wheels) Transfers: Sit to/from Stand Sit to Stand: Min assist;Mod assist           General transfer comment:  intially less assist, then pt with LOB needing min to mod A to correct; sit<>stand x 5 after ambulation with min to minguard A    Ambulation/Gait Ambulation/Gait assistance: Min assist;Mod assist Gait Distance (Feet): 150 Feet Assistive device: Rolling walker (2 wheels) Gait Pattern/deviations: Step-through pattern;Decreased stride length;Trunk flexed       General Gait Details: anterior bias with A for balance and keeping walker close; initially mod A for balance, but improved with increased ambulation; wife present and educated on level of assist for home   Stairs             Wheelchair Mobility    Modified Rankin (Stroke Patients Only)       Balance Overall balance assessment: Needs assistance Sitting-balance support: Feet supported Sitting balance-Leahy Scale: Good Sitting balance - Comments: able to don slip on shoes on EOB unaided Postural control: Posterior lean;Right lateral lean Standing balance support: No upper extremity supported Standing balance-Leahy Scale: Poor Standing balance comment: LOB standing without UE support                            Cognition Arousal/Alertness: Awake/alert Behavior During Therapy: Flat affect Overall Cognitive Status: Impaired/Different from baseline Area of Impairment: Problem solving;Memory;Attention;Safety/judgement                   Current Attention Level: Sustained Memory: Decreased short-term memory Following Commands: Follows one step commands consistently;Follows one step commands with increased time Safety/Judgement: Decreased awareness of safety Awareness: Emergent Problem Solving: Slow processing;Requires verbal cues;Requires tactile cues General  Comments: wife quizzing him on children's ages and pt unable to recall accurately        Exercises      General Comments General comments (skin integrity, edema, etc.): discussed follow up and pt's wife prefers HHPT      Pertinent  Vitals/Pain Pain Assessment: No/denies pain    Home Living                          Prior Function            PT Goals (current goals can now be found in the care plan section) Progress towards PT goals: Progressing toward goals    Frequency    Min 4X/week      PT Plan Current plan remains appropriate    Co-evaluation              AM-PAC PT "6 Clicks" Mobility   Outcome Measure  Help needed turning from your back to your side while in a flat bed without using bedrails?: A Little Help needed moving from lying on your back to sitting on the side of a flat bed without using bedrails?: A Little Help needed moving to and from a bed to a chair (including a wheelchair)?: A Little Help needed standing up from a chair using your arms (e.g., wheelchair or bedside chair)?: A Lot Help needed to walk in hospital room?: A Lot Help needed climbing 3-5 steps with a railing? : A Lot 6 Click Score: 15    End of Session Equipment Utilized During Treatment: Gait belt Activity Tolerance: Patient tolerated treatment well Patient left: in bed;with call bell/phone within reach;with family/visitor present;with bed alarm set   PT Visit Diagnosis: Other abnormalities of gait and mobility (R26.89);Muscle weakness (generalized) (M62.81)     Time: 7741-4239 PT Time Calculation (min) (ACUTE ONLY): 25 min  Charges:  $Gait Training: 8-22 mins $Therapeutic Activity: 8-22 mins                     [name], PT [facility] Services Pager:[phone] Office:[phone] 03/09/2021    [name] 03/09/2021, 6:55 PM

## 2021-03-09 NOTE — Progress Notes (Addendum)
Subjective: Wife and son at bedside state patient is looking much better today, almost back to baseline.  However patient does feel dizzy  ROS: negative except above  Examination  Vital signs in last 24 hours: Temp:  [97.8 F (36.6 C)-98.8 F (37.1 C)] 97.8 F (36.6 C) (12/01 1159) Pulse Rate:  [85-92] 85 (12/01 1159) Resp:  [14-20] 18 (12/01 1159) BP: (114-147)/(62-99) 139/91 (12/01 1159) SpO2:  [93 %-100 %] 99 % (12/01 1159)  General: lying in bed, NAD CVS: pulse-normal rate and rhythm RS: breathing comfortably, CTAB Skin: warm , red maculopapular rash on his back  Neuro: MS: Alert, orientedX3, follows commands CN: pupils equal and reactive,  EOMI, left visual neglect, face symmetric, tongue midline, normal sensation over face Motor: 5/5 strength in all extremities except 4/5 in LUE   Basic Metabolic Panel: Recent Labs  Lab 03/05/21 0216 03/06/21 0156 03/07/21 0102 03/08/21 0359 03/09/21 0054  NA 131* 136 134* 130* 131*  K 4.1 3.5 4.2 4.0 3.9  CL 103 108 104 102 103  CO2 14* 17* 18* 20* 18*  GLUCOSE 213* 105* 227* 193* 218*  BUN 34* 29* 22 16 14   CREATININE 1.81* 1.54* 1.57* 1.49* 1.40*  CALCIUM 9.0 9.5 9.7 9.1 9.1  MG  --   --   --  1.8  --     CBC: Recent Labs  Lab 03/02/21 1616 03/04/21 1223 03/05/21 0216 03/06/21 0156 03/08/21 0359  WBC 7.6 8.7 6.9 5.4 4.6  NEUTROABS 6.1 6.7  --   --   --   HGB 14.8 16.3 14.3 15.1 15.9  HCT 43.6 49.1 41.4 44.1 45.4  MCV 87.4 89.3 84.8 84.3 83.5  PLT 212 238 221 219 186     Coagulation Studies: No results for input(s): LABPROT, INR in the last 72 hours.  Imaging  MR Brain wo contrast 03/07/2021: Abnormal cortically based diffusion signal in the occipital and temporal lobes, greater in extent than on the recent prior MRI and favored to reflect the sequelae of recent seizure activity although a component of acute ischemia is also possible. Subcortical T2 hypointensity in these regions is of indeterminate etiology  but has been reported with nonketotic hyperglycemia and occipital seizures as well as ischemia and encephalitis.     ASSESSMENT AND PLAN: 65 year old male who has had staring spells for about 2 months.  Initially presented on 03/02/2021 with hemianopsia for 3 days.  Initial MRI was negative, this was suspected to be due to migraine.  He then presented again on 03/04/2021 with continued hemianopsia.  Repeat MRI showed restricted diffusion in right occipital region but looks atypical for stroke.  LTM EEG was obtained which showed seizures arising from right temporoparietal region.  New onset epilepsy -LTM EEG continues to show evidence of cortical dysfunction and epileptogenicity arising from right hemisphere, maximal right temporoparietal region likely due to underlying structural abnormality.  Brief ictal-interictal rhythmic discharges were also noted in right hemisphere, maximal right temporoparietal region which are on the ictal-interictal continuum, with high potential for seizure recurrence. -No clear etiology for new onset of seizures in this patient.  Lumbar puncture so far is not suggestive of infection.  Differentials include autoimmune versus paraneoplastic causes.     Recommendations -Continue phenobarbital 65 mg twice daily, phenytoin 100 mg every 8 hours and Vimpat 200 mg twice daily -Ideally I would have like to continue LTM EEG.  However patient has been and pulling off his electrodes at night.  Therefore, will give him a break from  EEG today and consider rehooking tomorrow -Will obtain CT chest Abdo pelvis to look for neoplasm -If negative, will send for serum and CSF autoimmune and paraneoplastic panel.  (I called lab and they have about 3cc CSF left) -Patient reports feeling dizzy which is most likely secondary to Dilantin and Vimpat. He has had significant agitation on Keppra when it was started initially. I am hesitant to make changes in the medication right now because it seems to  be controlling his seizures right now.  However, if dizziness persist, will consider switching Dilantin to topiramate or zonisamide -Patient's wife also reported rash this morning which improved after she gave him a bath.  I discussed with wife that if rash reappears, this could be secondary to Dilantin.  At that point, we will definitely need to switch Dilantin to a different AED -I am also reports that patient has not slept properly since he came to the hospital.  He does get agitated overnight which could be secondary to delirium.  Will order melatonin 10 mg once to be given at around 2000.  Ordered delirium precautions and requested nurse to minimize distractions after medications at 2200 -Continue management of rest of comorbidities per primary team  ADDENDUM -Patient had recurrence of rash on his back after IV phenytoin administration this afternoon.  Patient has also reported dizziness.  Therefore, will switch to Depakote (additional mood stabilizing properties)   I have spent a total of  65  minutes with the patient reviewing hospital notes,  test results, labs and examining the patient as well as establishing an assessment and plan that was discussed personally with the patient and family at bedside.50% of time was spent in direct patient care.    Zeb Comfort Epilepsy [facility] For questions after 5pm please refer to AMION to reach the Neurologist on call

## 2021-03-09 NOTE — Progress Notes (Signed)
Occupational Therapy Treatment Patient Details Name: [name] MRN: [id number] DOB: Oct 01, 1955 Today's Date: 03/09/2021   History of present illness Pt is a 65 y/o male who presents to the ED for persistent HA and vision changes 11/24. He was d/c home and returned with persistent visual symptoms and behavior changes. MRI revealed R PCA infarct. EEG positive for seizures. PMH significant for DM II, CKD IIIa, prostate CA.   OT comments  Pt presenting this session with increased weakness, difficulty taking steps, and poor attention/cognition. Pt requiring increased cues for initiation and safety. When standing, pt demonstrated a strong posterior lean, requiring mod A to maintain balance. Pt then taken for CT scan. OT discussed with pt's family that he would benefit from more therapy, potentially Acute Inpatient Rehab. Family at this time is adamant that pt can return home and they will "rehab" him. OT will continue to follow acutely.    Recommendations for follow up therapy are one component of a multi-disciplinary discharge planning process, led by the attending physician.  Recommendations may be updated based on patient status, additional functional criteria and insurance authorization.    Follow Up Recommendations  Home health OT    Assistance Recommended at Discharge Frequent or constant Supervision/Assistance  Equipment Recommendations  BSC/3in1    Recommendations for Other Services      Precautions / Restrictions Precautions Precautions: Fall Restrictions Weight Bearing Restrictions: No       Mobility Bed Mobility Overal bed mobility: Needs Assistance Bed Mobility: Supine to Sit;Sit to Supine     Supine to sit: Min assist Sit to supine: Min assist   General bed mobility comments: Min A due to weakness, pt grabbing OT around waist to help pull himself up.    Transfers Overall transfer level: Needs assistance Equipment used: Rolling walker (2 wheels) Transfers: Sit  to/from Stand Sit to Stand: Mod assist           General transfer comment: Pt having a posterior lean on standing, requiring assist to maintain balance     Balance Overall balance assessment: Needs assistance Sitting-balance support: Feet supported;No upper extremity supported Sitting balance-Leahy Scale: Poor Sitting balance - Comments: posterior lean and R lateral lean without UE support Postural control: Posterior lean;Right lateral lean Standing balance support: Reliant on assistive device for balance;Bilateral upper extremity supported Standing balance-Leahy Scale: Poor Standing balance comment: Pt leaning posteriorly, requiring assist to maintain balance.                           ADL either performed or assessed with clinical judgement   ADL Overall ADL's : Needs assistance/impaired                                       General ADL Comments: Session limited to bed mobility and transfers due to pt needing to go to CT.    Extremity/Trunk Assessment Upper Extremity Assessment Upper Extremity Assessment: Generalized weakness;RUE deficits/detail RUE Deficits / Details: Poor coordination   Lower Extremity Assessment Lower Extremity Assessment: Defer to PT evaluation        Vision   Vision Assessment?: Vision impaired- to be further tested in functional context   Perception Perception Perception: Not tested   Praxis Praxis Praxis: Not tested    Cognition Arousal/Alertness: Lethargic;Suspect due to medications Behavior During Therapy: Flat affect Overall Cognitive Status: Impaired/Different from baseline Area  of Impairment: Attention;Following commands;Safety/judgement;Awareness                   Current Attention Level: Sustained   Following Commands: Follows one step commands with increased time Safety/Judgement: Decreased awareness of safety;Decreased awareness of deficits Awareness: Emergent   General Comments: OAx4;  lethargic during the session. Pt was deliberate and required more time w/ tasks, requiring cues for initiation and safety.          Exercises     Shoulder Instructions       General Comments VSS on RA, Pt leaving room for CT, Family in room    Pertinent Vitals/ Pain       Pain Assessment: No/denies pain  Home Living                                          Prior Functioning/Environment              Frequency  Min 2X/week        Progress Toward Goals  OT Goals(current goals can now be found in the care plan section)  Progress towards OT goals: Progressing toward goals  Acute Rehab OT Goals Patient Stated Goal: None stated OT Goal Formulation: With family Time For Goal Achievement: 03/20/21 Potential to Achieve Goals: Fair ADL Goals Pt Will Perform Grooming: with min guard assist;standing Pt Will Perform Lower Body Bathing: with set-up;sit to/from stand Pt Will Perform Upper Body Dressing: with set-up;sitting Pt Will Perform Tub/Shower Transfer: with min guard assist;ambulating Additional ADL Goal #1: Pt will locate and obtain at least 3 items on L side to complete ADL task  Plan Discharge plan remains appropriate;Frequency remains appropriate    Co-evaluation                 AM-PAC OT "6 Clicks" Daily Activity     Outcome Measure   Help from another person eating meals?: A Little Help from another person taking care of personal grooming?: A Little Help from another person toileting, which includes using toliet, bedpan, or urinal?: A Lot Help from another person bathing (including washing, rinsing, drying)?: A Lot Help from another person to put on and taking off regular upper body clothing?: A Little Help from another person to put on and taking off regular lower body clothing?: A Lot 6 Click Score: 15    End of Session Equipment Utilized During Treatment: Gait belt;Rolling walker (2 wheels)  OT Visit Diagnosis: Other  abnormalities of gait and mobility (R26.89);Muscle weakness (generalized) (M62.81);Pain   Activity Tolerance Patient limited by lethargy   Patient Left in bed;with call bell/phone within reach;with family/visitor present;with nursing/sitter in room   Nurse Communication Mobility status        Time: 7062-3762 OT Time Calculation (min): 14 min  Charges: OT General Charges $OT Visit: 1 Visit OT Treatments $Therapeutic Activity: 8-22 mins  Gar Glance H., OTR/L Acute Rehabilitation  Damond Borchers Elane Yolanda Bonine 03/09/2021, 5:45 PM

## 2021-03-10 ENCOUNTER — Inpatient Hospital Stay (HOSPITAL_COMMUNITY): Payer: Medicare Other

## 2021-03-10 DIAGNOSIS — E785 Hyperlipidemia, unspecified: Secondary | ICD-10-CM | POA: Diagnosis not present

## 2021-03-10 DIAGNOSIS — R569 Unspecified convulsions: Secondary | ICD-10-CM | POA: Diagnosis not present

## 2021-03-10 DIAGNOSIS — N1831 Chronic kidney disease, stage 3a: Secondary | ICD-10-CM | POA: Diagnosis not present

## 2021-03-10 DIAGNOSIS — E1122 Type 2 diabetes mellitus with diabetic chronic kidney disease: Secondary | ICD-10-CM | POA: Diagnosis not present

## 2021-03-10 DIAGNOSIS — E1169 Type 2 diabetes mellitus with other specified complication: Secondary | ICD-10-CM | POA: Diagnosis not present

## 2021-03-10 LAB — BASIC METABOLIC PANEL
Anion gap: 10 (ref 5–15)
BUN: 14 mg/dL (ref 8–23)
CO2: 19 mmol/L — ABNORMAL LOW (ref 22–32)
Calcium: 9.4 mg/dL (ref 8.9–10.3)
Chloride: 103 mmol/L (ref 98–111)
Creatinine, Ser: 1.45 mg/dL — ABNORMAL HIGH (ref 0.61–1.24)
GFR, Estimated: 53 mL/min — ABNORMAL LOW (ref >60)
Glucose, Bld: 163 mg/dL — ABNORMAL HIGH (ref 70–99)
Potassium: 4 mmol/L (ref 3.5–5.1)
Sodium: 132 mmol/L — ABNORMAL LOW (ref 135–145)

## 2021-03-10 LAB — ANA W/REFLEX IF POSITIVE: Anti Nuclear Antibody (ANA): NEGATIVE

## 2021-03-10 LAB — GLUCOSE, CAPILLARY
Glucose-Capillary: 199 mg/dL — ABNORMAL HIGH (ref 70–99)
Glucose-Capillary: 258 mg/dL — ABNORMAL HIGH (ref 70–99)
Glucose-Capillary: 347 mg/dL — ABNORMAL HIGH (ref 70–99)

## 2021-03-10 LAB — PHENOBARBITAL LEVEL: Phenobarbital: 20 ug/mL (ref 15.0–30.0)

## 2021-03-10 LAB — VALPROIC ACID LEVEL: Valproic Acid Lvl: 39 ug/mL — ABNORMAL LOW (ref 50.0–100.0)

## 2021-03-10 MED ORDER — PHENOBARBITAL 30 MG PO TABS: 130.0000 mg | ORAL_TABLET | Freq: Every day | ORAL | Status: DC

## 2021-03-10 MED ORDER — PHENOBARBITAL 97.2 MG PO TABS
194.4000 mg | ORAL_TABLET | Freq: Every day | ORAL | Status: DC
  Administered 2021-03-11 – 2021-03-13 (×3): 194.4 mg via ORAL
  Filled 2021-03-10 (×3): qty 2

## 2021-03-10 MED ORDER — METFORMIN HCL 500 MG PO TABS
500.0000 mg | ORAL_TABLET | Freq: Two times a day (BID) | ORAL | Status: DC
  Administered 2021-03-11 – 2021-03-13 (×4): 500 mg via ORAL
  Filled 2021-03-10 (×4): qty 1

## 2021-03-10 MED ORDER — HYDROCORTISONE 1 % EX CREA
TOPICAL_CREAM | Freq: Two times a day (BID) | CUTANEOUS | Status: AC | PRN
  Filled 2021-03-10: qty 28

## 2021-03-10 MED ORDER — EMPAGLIFLOZIN 25 MG PO TABS
25.0000 mg | ORAL_TABLET | Freq: Every morning | ORAL | Status: DC
  Administered 2021-03-10 – 2021-03-13 (×4): 25 mg via ORAL
  Filled 2021-03-10 (×4): qty 1

## 2021-03-10 MED ORDER — PHENOBARBITAL 32.4 MG PO TABS: 64.8000 mg | ORAL_TABLET | Freq: Every day | ORAL | Status: DC

## 2021-03-10 NOTE — Progress Notes (Signed)
LTM EEG hooked up and running - no initial skin breakdown - push button tested - neuro notified. [facility] monitoring.  

## 2021-03-10 NOTE — Progress Notes (Signed)
Physical Therapy Treatment Patient Details Name: [name] MRN: [id number] DOB: [birth year] Today's Date: 03/10/2021   History of Present Illness Pt is a 65 y/o male who presents to the ED for persistent HA and vision changes 11/24. He was d/c home and returned with persistent visual symptoms and behavior changes. MRI revealed R PCA infarct. EEG positive for seizures. PMH significant for DM II, CKD IIIa, prostate CA.    PT Comments    Patient progressing with in bed mobility/exercises this session due to on continuous EEG and family reports MD wanted pt to stay in bed.  Patient participated well with strengthening activities.  Wife encouraged he could still participate some as she is eager to be able to take him home.  Continue to recommend HHPT at d/c.  PT to follow.   Recommendations for follow up therapy are one component of a multi-disciplinary discharge planning process, led by the attending physician.  Recommendations may be updated based on patient status, additional functional criteria and insurance authorization.  Follow Up Recommendations  Home health PT     Assistance Recommended at Discharge    Equipment Recommendations  Rolling walker (2 wheels);BSC/3in1    Recommendations for Other Services       Precautions / Restrictions Precautions Precautions: Fall Precaution Comments: seizure; continuous EEG x 12-24 hours     Mobility  Bed Mobility   Bed Mobility: Rolling Rolling: Modified independent (Device/Increase time)         General bed mobility comments: in bed mobility only as per wife MD did not want him up to bathroom on EEG    Transfers                        Ambulation/Gait                   Stairs             Wheelchair Mobility    Modified Rankin (Stroke Patients Only)       Balance                                            Cognition Arousal/Alertness: Awake/alert Behavior During Therapy: Flat  affect Overall Cognitive Status: Impaired/Different from baseline Area of Impairment: Problem solving;Memory;Attention;Safety/judgement                   Current Attention Level: Sustained Memory: Decreased short-term memory Following Commands: Follows one step commands with increased time;Follows one step commands consistently Safety/Judgement: Decreased awareness of safety   Problem Solving: Slow processing;Requires tactile cues;Requires verbal cues          Exercises General Exercises - Upper Extremity Shoulder Horizontal ABduction: Strengthening;10 reps;Both;Theraband Theraband Level (Shoulder Horizontal Abduction): Level 3 (Green) Elbow Flexion: Strengthening;Both;10 reps;Supine Elbow Extension: Strengthening;Both;10 reps;Supine;Theraband Theraband Level (Elbow Extension): Level 3 (Green) General Exercises - Lower Extremity Hip ABduction/ADduction: Strengthening;Sidelying;Supine;10 reps;Both (sidelying clamshell x 10 each, then hooklying with blue t-band) Straight Leg Raises: Strengthening;10 reps;Supine;Both Hip Flexion/Marching: Strengthening;Both;10 reps;Supine (hooklying) Low Level/ICU Exercises Stabilized Bridging: Strengthening;5 reps;Supine;Both    General Comments        Pertinent Vitals/Pain Pain Assessment: Faces Faces Pain Scale: Hurts little more Pain Location: back Pain Descriptors / Indicators: Aching Pain Intervention(s): Monitored during session;Repositioned    Home Living  Prior Function            PT Goals (current goals can now be found in the care plan section) Progress towards PT goals: Progressing toward goals    Frequency    Min 4X/week      PT Plan Current plan remains appropriate    Co-evaluation              AM-PAC PT "6 Clicks" Mobility   Outcome Measure  Help needed turning from your back to your side while in a flat bed without using bedrails?: None Help needed moving  from lying on your back to sitting on the side of a flat bed without using bedrails?: A Little Help needed moving to and from a bed to a chair (including a wheelchair)?: A Little Help needed standing up from a chair using your arms (e.g., wheelchair or bedside chair)?: A Lot Help needed to walk in hospital room?: A Lot Help needed climbing 3-5 steps with a railing? : A Lot 6 Click Score: 16    End of Session   Activity Tolerance: Patient tolerated treatment well Patient left: in bed;with call bell/phone within reach;with family/visitor present;with bed alarm set   PT Visit Diagnosis: Other abnormalities of gait and mobility (R26.89);Muscle weakness (generalized) (M62.81)     Time: 8295-6213 PT Time Calculation (min) (ACUTE ONLY): 25 min  Charges:  $Therapeutic Exercise: 8-22 mins $Therapeutic Activity: 8-22 mins                     [name], PT Acute Rehabilitation Services YQMVH:[phone] [phone] 03/10/2021    [name] 03/10/2021, 5:17 PM

## 2021-03-10 NOTE — Progress Notes (Signed)
[facility] PROGRESS NOTE   [name] MOQ:[id number] DOB: 1955/11/08 DOA: 03/04/2021  PCP: [name], DO  Brief History/Interval Summary: 65 y.o. male with medical history significant for type 2 diabetes, HLD, CKD stage IIIa, prostate cancer who presented to the ED for persistent headache and change in vision.  Initially presented on 11/24.  MRI brain was limited evaluation due to patient's intolerance.  Patient was discharged home.  Came back with persistent visual deficits.  Repeat MRI showed an acute stroke in the occipital lobe.  Patient was hospitalized for further management.  Patient seen by neurology.  His visual symptoms were concerning also for seizure activity.  EEG confirmed this.  Patient started on phenytoin.  Remains on long-term monitoring.  MRI brain to be repeated at some point.  Reason for Visit: Acute stroke  Consultants: Neurology  Procedures:  Transthoracic echocardiogram EEG  Subjective/Interval History: No further episodes of itching or rash noted overnight, repeat prolonged EEG to be placed this morning, patient otherwise ambulating this morning requiring support but much more awake and alert than previous  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  Seizures, ongoing as above Neuro following as above EEG continues to show ongoing epileptogenicity with ictal/interictal rhythmic discharges per neurology  Abnormal liver lesion Discussed case w/ Dr Kale(Oncology) - will follow repeat PSA; follow MRI if able to  obtain prior to discharge (currently on hold due to ongoing EEG) and potentially obtain biopsy if area is ruled out as a hemangioma  Chronic R ureter obstruction -Discussed with Urology - given likely chronic, asymptomatic, and non-infected stone can follow up outpatient with established urology for ongoing follow-up.  Anxiety - Xanax as needed given above - low dose and sparingly given age/mental status changes from baseline.  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   will resume and follow rHe was on metformin and Jardiance prior to admission -wife indicates he was moderately noncompliant with these medications -  Family requesting to be discharged on previous home regiment with improved diet which we discussed would be difficult given his elevated A1c but if he can adjust his diet appropriately and see his PCP within 5 days of discharge it may be a reasonable request.  Chronic kidney disease stage IIIa/metabolic acidosis, stable Labs remained stable  Essential hypertension/hyponatremia Holding ACE inhibitor.  Allowing some degree of permissive hypertension.  Patient is reasonably well controlled. Sodium level is stable.  Hyperlipidemia Continue statin.   DVT Prophylaxis: Subcutaneous heparin Code Status: Full code Family Communication: Discussed with patient's wife, son at bedside Disposition Plan: Hopefully return home when improved pending clinical course  Status is: Inpatient  Remains inpatient appropriate because: Stroke, acute seizures  Medications: Scheduled:  aspirin  81 mg Oral Daily   atorvastatin  20 mg Oral QHS   divalproex  500 mg Oral BID   enoxaparin (LOVENOX) injection  40 mg Subcutaneous Q24H   insulin aspart  0-9 Units Subcutaneous TID WC   lacosamide  200 mg Oral BID   lidocaine  5 mL Intradermal Once   lidocaine  5 mL Intradermal Once   melatonin  10 mg Oral Daily   pantoprazole  40  mg Oral q morning   phenobarbital  64.8 mg Oral BID   vitamin B-12  500 mcg Oral Daily    Objective:  Vital Signs  Vitals:   03/09/21 1928 03/09/21 2011 03/10/21 0002 03/10/21 0340  BP: 118/74 115/68 109/69 113/79  Pulse: 98 91 87 87  Resp: 18 18  18   Temp: 98 F (36.7 C) 98.4 F (36.9 C) 97.9 F (36.6 C) 97.9 F (36.6 C)  TempSrc: Oral Oral Oral Oral  SpO2: 97% 98% 98% 95%  Weight:      Height:        Intake/Output Summary (Last 24 hours) at 03/10/2021 0749 Last data filed at 03/10/2021 0400 Gross per 24 hour  Intake 240 ml  Output 400 ml  Net -160 ml    Filed Weights   03/04/21 2040  Weight: 86.2 kg    General appearance: Awake alert.  In no distress.  Somewhat distracted but more appropriately answering questions Resp: Clear to auscultation bilaterally.  Normal effort Cardio: S1-S2 is normal regular.  No S3-S4.  No rubs murmurs or bruit GI: Abdomen is soft.  Nontender nondistended.  Bowel sounds are present normal.  No masses organomegaly Extremities: No edema.  Moving all of his extremities Neurologic: Slightly distracted.  Oriented to year or month.  No focal neurological deficits.    Lab Results:  Data Reviewed: I have personally reviewed following labs and imaging studies  CBC: Recent Labs  Lab 03/04/21 1223 03/05/21 0216 03/06/21 0156 03/08/21 0359  WBC 8.7 6.9 5.4 4.6  NEUTROABS 6.7  --   --   --   HGB 16.3 14.3 15.1 15.9  HCT 49.1 41.4 44.1 45.4  MCV 89.3 84.8 84.3 83.5  PLT 238 221 219 186     Basic Metabolic Panel: Recent Labs  Lab 03/06/21 0156 03/07/21 0102 03/08/21 0359 03/09/21 0054 03/10/21 0310  NA 136 134* 130* 131* 132*  K 3.5 4.2 4.0 3.9 4.0  CL 108 104 102 103 103  CO2 17* 18* 20* 18* 19*  GLUCOSE 105* 227* 193* 218* 163*  BUN 29* 22 16 14 14   CREATININE 1.54* 1.57* 1.49* 1.40* 1.45*  CALCIUM 9.5 9.7 9.1 9.1 9.4  MG  --   --  1.8  --   --      GFR: Estimated Creatinine Clearance: 54.2 mL/min (A) (by C-G formula  based on SCr of 1.45 mg/dL (H)).  Liver Function Tests: Recent Labs  Lab 03/07/21 0102  AST 15  ALT 17  ALKPHOS 83  BILITOT 0.9  PROT 6.1*  ALBUMIN 3.7     No results for input(s): LIPASE, AMYLASE in the last 168 hours.   Coagulation Profile: Recent Labs  Lab 03/04/21 1223  INR 0.9      CBG: Recent Labs  Lab 03/09/21 0734 03/09/21 1200 03/09/21 1651 03/09/21 2116 03/10/21 0604  GLUCAP 246* 242* 244* 243* 199*     Lipid Profile: No results for input(s): CHOL, HDL, LDLCALC, TRIG, CHOLHDL, LDLDIRECT in the last 72 hours.    Recent Results (from the past 240 hour(s))  Resp Panel by RT-PCR (Flu A&B, Covid) Nasopharyngeal Swab     Status: None   Collection Time: 03/02/21  8:57 PM   Specimen: Nasopharyngeal Swab; Nasopharyngeal(NP) swabs in vial transport medium  Result Value Ref Range Status   SARS Coronavirus 2 by RT PCR NEGATIVE NEGATIVE Final    Comment: (NOTE) SARS-CoV-2 target nucleic acids are  NOT DETECTED.  The SARS-CoV-2 RNA is generally detectable in upper respiratory specimens during the acute phase of infection. The lowest concentration of SARS-CoV-2 viral copies this assay can detect is 138 copies/mL. A negative result does not preclude SARS-Cov-2 infection and should not be used as the sole basis for treatment or other patient management decisions. A negative result may occur with  improper specimen collection/handling, submission of specimen other than nasopharyngeal swab, presence of viral mutation(s) within the areas targeted by this assay, and inadequate number of viral copies(<138 copies/mL). A negative result must be combined with clinical observations, patient history, and epidemiological information. The expected result is Negative.  Fact Sheet for Patients:  EntrepreneurPulse.com.au  Fact Sheet for Healthcare Providers:  IncredibleEmployment.be  This test is no t yet approved or cleared by the  [place] FDA and  has been authorized for detection and/or diagnosis of SARS-CoV-2 by FDA under an Emergency Use Authorization (EUA). This EUA will remain  in effect (meaning this test can be used) for the duration of the COVID-19 declaration under Section 564(b)(1) of the Act, 21 U.S.C.section 360bbb-3(b)(1), unless the authorization is terminated  or revoked sooner.       Influenza A by PCR NEGATIVE NEGATIVE Final   Influenza B by PCR NEGATIVE NEGATIVE Final    Comment: (NOTE) The Xpert Xpress SARS-CoV-2/FLU/RSV plus assay is intended as an aid in the diagnosis of influenza from Nasopharyngeal swab specimens and should not be used as a sole basis for treatment. Nasal washings and aspirates are unacceptable for Xpert Xpress SARS-CoV-2/FLU/RSV testing.  Fact Sheet for Patients: EntrepreneurPulse.com.au  Fact Sheet for Healthcare Providers: IncredibleEmployment.be  This test is not yet approved or cleared by the [place] FDA and has been authorized for detection and/or diagnosis of SARS-CoV-2 by FDA under an Emergency Use Authorization (EUA). This EUA will remain in effect (meaning this test can be used) for the duration of the COVID-19 declaration under Section 564(b)(1) of the Act, 21 U.S.C. section 360bbb-3(b)(1), unless the authorization is terminated or revoked.  Performed at [facility], [address], [place], [place] [address]   CSF culture w Gram Stain     Status: None (Preliminary result)   Collection Time: 03/08/21  4:00 PM   Specimen: CSF; Cerebrospinal Fluid  Result Value Ref Range Status   Specimen Description CSF  Final   Special Requests NONE  Final   Gram Stain   Final    WBC PRESENT, PREDOMINANTLY MONONUCLEAR NO ORGANISMS SEEN CYTOSPIN SMEAR    Culture   Final    NO GROWTH 2 DAYS Performed at [facility], [place] [address], [place], [place] [address]    Report Status PENDING  Incomplete        Radiology Studies: CT CHEST ABDOMEN PELVIS W CONTRAST  Result Date: 03/09/2021 CLINICAL DATA:  Cancer of unknown primary EXAM: CT CHEST, ABDOMEN, AND PELVIS WITH CONTRAST TECHNIQUE: Multidetector CT imaging of the chest, abdomen and pelvis was performed following the standard protocol during bolus administration of intravenous contrast. CONTRAST:  158mL OMNIPAQUE IOHEXOL 300 MG/ML  SOLN COMPARISON:  None. FINDINGS: CT CHEST FINDINGS Cardiovascular: Heart size is normal. No pericardial effusion identified. Main pulmonary artery is normal caliber. Thoracic aorta is normal in caliber with mild atherosclerotic plaques. Mediastinum/Nodes: No bulky axillary, mediastinal or hilar lymphadenopathy identified. Lungs/Pleura: No focal consolidation identified in the lungs. Mild breathing motion and subsegmental atelectatic changes. No pleural effusion or pneumothorax. Musculoskeletal: No suspicious bony lesions identified in the chest. CT  ABDOMEN PELVIS FINDINGS Hepatobiliary: Liver is normal in size and contour. There is a 1.9 cm peripherally enhancing lesion in the right hepatic lobe near the dome and a 1.4 cm faint ill-defined hypodensity in segment 4B. Gallbladder is surgically absent. No biliary ductal dilatation. Pancreas: Unremarkable. No pancreatic ductal dilatation or surrounding inflammatory changes. Spleen: Normal in size without focal abnormality. Adrenals/Urinary Tract: Adrenal glands appear normal. Normal perfusion and excretion of the right kidney and diminished perfusion and delayed excretion of the left kidney. Severe left hydroureteronephrosis secondary to a large obstructing calculus at the UVJ measuring 15 x 10 mm. Note is also made of a 4 mm calculus just proximally within the distal ureter. Associated left renal cortical thinning without significant perinephric fat stranding. A few renal cortical cysts identified measuring up to 3.2 cm on the right. Urinary bladder appears otherwise within normal  limits. Stomach/Bowel: No bowel obstruction, free air or pneumatosis. No bowel wall edema or thickening identified. Moderate amount of retained fecal material throughout the colon. Appendix is normal Vascular/Lymphatic: No significant vascular findings are present. No enlarged abdominal or pelvic lymph nodes. Reproductive: Brachytherapy seeds within the prostate gland. Other: Small umbilical hernia containing fat.  No ascites. Musculoskeletal: No suspicious bony lesions identified. Degenerative changes in the lower lumbar spine. IMPRESSION: 1. No suspicious mass or lymphadenopathy identified in the chest. 2. 1.9 cm peripherally enhancing right hepatic lobe lesion which is indeterminate, could represent hemangioma or metastasis given the history. Faint hepatic hypodensity in segment 4B could represent focal fatty infiltration. Consider follow-up hepatic protocol MRI. 3. Otherwise no suspicious mass or lymphadenopathy in the abdomen or pelvis. 4. Large likely chronically obstructing calculus in the distal left ureter at the UVJ. Severe left hydroureteronephrosis with associated renal cortical thinning and relatively diminished perfusion. Electronically Signed   By: Ofilia Neas M.D.   On: 03/09/2021 16:40       LOS: 5 days   Little Ishikawa  [facility] Pager: Secure chat  03/10/2021, 7:49 AM

## 2021-03-10 NOTE — Progress Notes (Signed)
.  LTM maint complete - no skin breakdown , all leads attached.  [facility] monitored, Event button test confirmed by [facility].

## 2021-03-10 NOTE — Progress Notes (Signed)
Request to IR for liver lesion biopsy seen on CT chest/abdomen pelvis w/contrast 12/1 - patient history and imaging reviewed by Dr. Anselm Pancoast who suspects this to be a hemangioma, in order to further evaluate this lesion patient would need a dedicated liver MRI either during his inpatient stay or as an outpatient.  The above has been relayed to Dr. Avon Gully via Epic secure chat today.  Candiss Norse, PA-C

## 2021-03-10 NOTE — Progress Notes (Addendum)
Subjective: NAEO. Wife states patient had good sleep last night. Denies any clinical seizures.   ROS: negative except above  Examination  Vital signs in last 24 hours: Temp:  [97.9 F (36.6 C)-98.6 F (37 C)] 98.6 F (37 C) (12/02 1011) Pulse Rate:  [87-98] 87 (12/02 1011) Resp:  [18-19] 19 (12/02 1011) BP: (109-137)/(68-87) 137/87 (12/02 1011) SpO2:  [95 %-100 %] 100 % (12/02 1011)  General: lying in bed, NAD CVS: pulse-normal rate and rhythm RS: breathing comfortably, CTAB Skin: warm , red maculopapular rash on his back   Neuro: MS: Alert, orientedX3, follows commands CN: pupils equal and reactive,  EOMI, left visual neglect, face symmetric, tongue midline, normal sensation over face Motor: 5/5 strength in all extremities except 4/5 in LUE  Basic Metabolic Panel: Recent Labs  Lab 03/06/21 0156 03/07/21 0102 03/08/21 0359 03/09/21 0054 03/10/21 0310  NA 136 134* 130* 131* 132*  K 3.5 4.2 4.0 3.9 4.0  CL 108 104 102 103 103  CO2 17* 18* 20* 18* 19*  GLUCOSE 105* 227* 193* 218* 163*  BUN 29* 22 16 14 14   CREATININE 1.54* 1.57* 1.49* 1.40* 1.45*  CALCIUM 9.5 9.7 9.1 9.1 9.4  MG  --   --  1.8  --   --     CBC: Recent Labs  Lab 03/04/21 1223 03/05/21 0216 03/06/21 0156 03/08/21 0359  WBC 8.7 6.9 5.4 4.6  NEUTROABS 6.7  --   --   --   HGB 16.3 14.3 15.1 15.9  HCT 49.1 41.4 44.1 45.4  MCV 89.3 84.8 84.3 83.5  PLT 238 221 219 186     Coagulation Studies: No results for input(s): LABPROT, INR in the last 72 hours.  Imaging  CT C/A/P: 03/10/2021:  1. No suspicious mass or lymphadenopathy identified in the chest. 2. 1.9 cm peripherally enhancing right hepatic lobe lesion which is indeterminate, could represent hemangioma or metastasis given the history. Faint hepatic hypodensity in segment 4B could represent focal fatty infiltration. Consider follow-up hepatic protocol MRI. 3. Otherwise no suspicious mass or lymphadenopathy in the abdomen or pelvis. 4.  Large likely chronically obstructing calculus in the distal left ureter at the UVJ. Severe left hydroureteronephrosis with associated renal cortical thinning and relatively diminished perfusion.   ASSESSMENT AND PLAN: 65 year old male who has had staring spells for about 2weeks to 2 months ( unclear exact duration but wife thinks its most likely 2 weeks).  Initially presented on 03/02/2021 with hemianopsia for 3 days.  Initial MRI was negative, this was suspected to be due to migraine.  He then presented again on 03/04/2021 with continued hemianopsia.  Repeat MRI showed restricted diffusion in right occipital region but looks atypical for stroke.  LTM EEG was obtained which showed seizures arising from right temporoparietal region.   New onset epilepsy -LTM EEG continues to show evidence of cortical dysfunction and epileptogenicity arising from right hemisphere, maximal right temporoparietal region likely due to underlying structural abnormality.  Brief ictal-interictal rhythmic discharges were also noted in right hemisphere, maximal right temporoparietal region which are on the ictal-interictal continuum, with high potential for seizure recurrence. -No clear etiology for new onset of seizures in this patient.  Lumbar puncture so far is not suggestive of infection.  Differentials include autoimmune versus paraneoplastic causes.       Recommendations -Continue phenobarbital 65 mg twice daily, VPA 500mg  BID  and Vimpat 200 mg twice daily -Repeat video EEG for 24 hours to look for intermittent seizures - Will send  for serum and CSF autoimmune / paraneoplastic panel.  (I called lab and they have about 3cc CSF left) -Continue seizure precautions, delirium precautions -Continue management of rest of comorbidities per primary team -If no seizures overnight, patient will likely be ready for discharge from neurology standpoint with follow-up in 2 to 4 weeks. Wwill likely need a follow up MRI brain as well  at that time  Seizure precautions: Per [facility] statutes, patients with seizures are not allowed to drive until they have been seizure-free for six months and cleared by a physician    Use caution when using heavy equipment or power tools. Avoid working on ladders or at heights. Take showers instead of baths. Ensure the water temperature is not too high on the home water heater. Do not go swimming alone. Do not lock yourself in a room alone (i.e. bathroom). When caring for infants or small children, sit down when holding, feeding, or changing them to minimize risk of injury to the child in the event you have a seizure. Maintain good sleep hygiene. Avoid alcohol.    If patient has another seizure, call 911 and bring them back to the ED if: A.  The seizure lasts longer than 5 minutes.      B.  The patient doesn't wake shortly after the seizure or has new problems such as difficulty seeing, speaking or moving following the seizure C.  The patient was injured during the seizure D.  The patient has a temperature over 102 F (39C) E.  The patient vomited during the seizure and now is having trouble breathing    During the Seizure   - First, ensure adequate ventilation and place patients on the floor on their left side  Loosen clothing around the neck and ensure the airway is patent. If the patient is clenching the teeth, do not force the mouth open with any object as this can cause severe damage - Remove all items from the surrounding that can be hazardous. The patient may be oblivious to what's happening and may not even know what he or she is doing. If the patient is confused and wandering, either gently guide him/her away and block access to outside areas - Reassure the individual and be comforting - Call 911. In most cases, the seizure ends before EMS arrives. However, there are cases when seizures may last over 3 to 5 minutes. Or the individual may have developed breathing difficulties  or severe injuries. If a pregnant patient or a person with diabetes develops a seizure, it is prudent to call an ambulance. - Finally, if the patient does not regain full consciousness, then call EMS. Most patients will remain confused for about 45 to 90 minutes after a seizure, so you must use judgment in calling for help. - Avoid restraints but make sure the patient is in a bed with padded side rails - Place the individual in a lateral position with the neck slightly flexed; this will help the saliva drain from the mouth and prevent the tongue from falling backward - Remove all nearby furniture and other hazards from the area - Provide verbal assurance as the individual is regaining consciousness - Provide the patient with privacy if possible - Call for help and start treatment as ordered by the caregiver    After the Seizure (Postictal Stage)   After a seizure, most patients experience confusion, fatigue, muscle pain and/or a headache. Thus, one should permit the individual to sleep. For the next few  days, reassurance is essential. Being calm and helping reorient the person is also of importance.   Most seizures are painless and end spontaneously. Seizures are not harmful to others but can lead to complications such as stress on the lungs, brain and the heart. Individuals with prior lung problems may develop labored breathing and respiratory distress.    I have spent a total of  36  minutes with the patient reviewing hospital notes,  test results, labs and examining the patient as well as establishing an assessment and plan that was discussed personally with the patient and family at bedside.50% of time was spent in direct patient care.   Zeb Comfort Epilepsy [facility] For questions after 5pm please refer to AMION to reach the Neurologist on call

## 2021-03-11 DIAGNOSIS — R569 Unspecified convulsions: Secondary | ICD-10-CM | POA: Diagnosis not present

## 2021-03-11 DIAGNOSIS — E1169 Type 2 diabetes mellitus with other specified complication: Secondary | ICD-10-CM | POA: Diagnosis not present

## 2021-03-11 DIAGNOSIS — N1831 Chronic kidney disease, stage 3a: Secondary | ICD-10-CM | POA: Diagnosis not present

## 2021-03-11 DIAGNOSIS — E785 Hyperlipidemia, unspecified: Secondary | ICD-10-CM | POA: Diagnosis not present

## 2021-03-11 DIAGNOSIS — E1122 Type 2 diabetes mellitus with diabetic chronic kidney disease: Secondary | ICD-10-CM | POA: Diagnosis not present

## 2021-03-11 LAB — BASIC METABOLIC PANEL
Anion gap: 12 (ref 5–15)
BUN: 18 mg/dL (ref 8–23)
CO2: 20 mmol/L — ABNORMAL LOW (ref 22–32)
Calcium: 9.8 mg/dL (ref 8.9–10.3)
Chloride: 102 mmol/L (ref 98–111)
Creatinine, Ser: 1.65 mg/dL — ABNORMAL HIGH (ref 0.61–1.24)
GFR, Estimated: 46 mL/min — ABNORMAL LOW (ref >60)
Glucose, Bld: 153 mg/dL — ABNORMAL HIGH (ref 70–99)
Potassium: 4.2 mmol/L (ref 3.5–5.1)
Sodium: 134 mmol/L — ABNORMAL LOW (ref 135–145)

## 2021-03-11 LAB — GLUCOSE, CAPILLARY
Glucose-Capillary: 181 mg/dL — ABNORMAL HIGH (ref 70–99)
Glucose-Capillary: 183 mg/dL — ABNORMAL HIGH (ref 70–99)

## 2021-03-11 LAB — PHENOBARBITAL LEVEL: Phenobarbital: 20.1 ug/mL (ref 15.0–30.0)

## 2021-03-11 LAB — PSA, TOTAL AND FREE
PSA, Free Pct: UNDETERMINED %
PSA, Free: <0.02 ng/mL
Prostate Specific Ag, Serum: <0.1 ng/mL (ref 0.0–4.0)

## 2021-03-11 LAB — HSV 1/2 PCR, CSF
HSV-1 DNA: NEGATIVE
HSV-2 DNA: NEGATIVE

## 2021-03-11 NOTE — Plan of Care (Signed)
  Problem: Education: Goal: Knowledge of General Education information will improve Description: Including pain rating scale, medication(s)/side effects and non-pharmacologic comfort measures Outcome: Progressing   Problem: Health Behavior/Discharge Planning: Goal: Ability to manage health-related needs will improve Outcome: Progressing   Problem: Clinical Measurements: Goal: Ability to maintain clinical measurements within normal limits will improve Outcome: Progressing Goal: Will remain free from infection Outcome: Progressing Goal: Diagnostic test results will improve Outcome: Progressing Goal: Respiratory complications will improve Outcome: Progressing Goal: Cardiovascular complication will be avoided Outcome: Progressing   Problem: Activity: Goal: Risk for activity intolerance will decrease Outcome: Progressing   Problem: Nutrition: Goal: Adequate nutrition will be maintained Outcome: Progressing   Problem: Coping: Goal: Level of anxiety will decrease Outcome: Progressing   Problem: Elimination: Goal: Will not experience complications related to bowel motility Outcome: Progressing Goal: Will not experience complications related to urinary retention Outcome: Progressing   Problem: Safety: Goal: Ability to remain free from injury will improve Outcome: Progressing   Problem: Safety: Goal: Non-violent Restraint(s) Outcome: Progressing

## 2021-03-11 NOTE — Progress Notes (Signed)
LTM dced. No skin breakdown noted. Results pending.

## 2021-03-11 NOTE — Procedures (Addendum)
Patient Name: [name]  MRN: [id number]  Epilepsy Attending: Lora Havens  Referring Physician/Provider: Dr Rosalin Hawking Duration: 03/10/2021 1034 to 03/11/2021 1147   Patient history: 65yo M with right PCA stroke and visual hallucinations. EEG to evaluate for seizure.    Level of alertness: Awake, asleep   AEDs during EEG study: Phenobarb,Vimpat, Clonazepam, VPA   Technical aspects: This EEG study was done with scalp electrodes positioned according to the 10-20 International system of electrode placement. Electrical activity was acquired at a sampling rate of 500Hz  and reviewed with a high frequency filter of 70Hz  and a low frequency filter of 1Hz . EEG data were recorded continuously and digitally stored.    Description: No clear posterior dominant rhythm consists of 8 Hz activity of moderate voltage (25-35 uV) seen predominantly in posterior head regions, symmetric and reactive to eye opening and eye closing. Sleep was characterized by vertex waves, sleep spindles (12 to 14 Hz), maximal frontocentral region.     EEG also showed intermittent lateralized periodic discharges in right hemisphere, maximal right temporoparietal region at 1 Hz which at times appeared rhythmic consistent with brief ictal-interictal rhythmic discharges.   Hyperventilation and photic stimulation were not performed.    ABNORMALITY -Brief ictal-interictal rhythmic discharges, right hemisphere, maximal right temporoparietal region    IMPRESSION: This study showed evidence of cortical dysfunction and epileptogenicity arising from right hemisphere, maximal right temporoparietal region likely due to underlying structural abnormality.  Brief ictal-interictal rhythmic discharges were also noted in right hemisphere, maximal right temporoparietal region which are on the ictal-interictal continuum, with high potential for seizure recurrence. No definite seizures were seen during this study.    [name]

## 2021-03-11 NOTE — Progress Notes (Signed)
[facility] PROGRESS NOTE   CIEL YANES WUX:[id number] DOB: 1955-10-26 DOA: 03/04/2021  PCP: [name], DO  Brief History/Interval Summary: 65 y.o. male with medical history significant for type 2 diabetes, HLD, CKD stage IIIa, prostate cancer who presented to the ED for persistent headache and change in vision.  Initially presented on 11/24.  MRI brain was limited evaluation due to patient's intolerance.  Patient was discharged home.  Came back with persistent visual deficits.  Repeat MRI showed an acute stroke in the occipital lobe.  Patient was hospitalized for further management.  Patient seen by neurology.  His visual symptoms were concerning also for seizure activity.  EEG confirmed this.  Repeat MRI shows no significant stroke.  Consultants: Neurology  Procedures:  Transthoracic echocardiogram EEG  Subjective/Interval History: No acute issues or events overnight denies nausea vomiting diarrhea constipation headache fevers chills or chest pain  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  Seizures, ongoing as above Neuro following as above EEG continues to show ongoing epileptogenicity with ictal/interictal rhythmic discharges per neurology  Abnormal liver lesion Discussed case w/ Dr Kale(Oncology) - will follow repeat PSA; follow MRI if able to obtain prior to discharge (currently on hold due to ongoing EEG) and potentially obtain biopsy if area is ruled out as a hemangioma  Chronic R ureter obstruction -Discussed with Urology -  given likely chronic, asymptomatic, and non-infected stone can follow up outpatient with established urology for ongoing follow-up.  Anxiety - Xanax as needed given above - low dose and sparingly given age/mental status changes from baseline.  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   will resume and follow rHe was on metformin and Jardiance prior to admission -wife indicates he was moderately noncompliant with these medications -  Family requesting to be discharged on previous home regiment with improved diet which we discussed would be difficult given his elevated A1c but if he can adjust his diet appropriately and see his PCP within 5 days of discharge it may be a reasonable request.  Chronic kidney disease stage IIIa/metabolic acidosis, stable Labs remained stable  Essential hypertension/hyponatremia Holding ACE inhibitor.  Allowing some degree of permissive hypertension.  Patient is reasonably well controlled. Sodium level is stable.  Hyperlipidemia Continue statin.   DVT Prophylaxis: Subcutaneous heparin Code Status: Full code Family Communication: Discussed with patient's wife, son at bedside Disposition Plan: Hopefully return home when improved pending clinical course  Status is: Inpatient  Remains inpatient appropriate because: Stroke, acute seizures  Medications: Scheduled:  aspirin  81 mg Oral Daily   atorvastatin  20 mg Oral QHS   divalproex  500 mg Oral BID   empagliflozin  25 mg Oral q morning   enoxaparin (LOVENOX) injection  40 mg Subcutaneous Q24H   insulin aspart  0-9 Units Subcutaneous TID WC   lacosamide  200 mg Oral BID   lidocaine  5 mL Intradermal Once   lidocaine  5 mL Intradermal Once   melatonin  10 mg Oral Daily   metFORMIN  500 mg Oral BID WC   pantoprazole  40 mg Oral q morning  PHENobarbital  194.4 mg Oral Q1200   vitamin B-12  500 mcg Oral Daily    Objective:  Vital  Signs  Vitals:   03/10/21 1609 03/10/21 2006 03/11/21 0010 03/11/21 0401  BP: (!) 156/69 108/72 129/75 137/87  Pulse: 96 (!) 107 88 96  Resp: 19 19 19 17   Temp: 98 F (36.7 C) 98.8 F (37.1 C) 98.5 F (36.9 C) 97.9 F (36.6 C)  TempSrc:  Oral Oral Oral  SpO2: 96% 94% 98% 95%  Weight:      Height:       No intake or output data in the 24 hours ending 03/11/21 0755  Filed Weights   03/04/21 2040  Weight: 86.2 kg    General appearance: Awake alert.  In no distress.  Somewhat distracted but more appropriately answering questions Resp: Clear to auscultation bilaterally.  Normal effort Cardio: S1-S2 is normal regular.  No S3-S4.  No rubs murmurs or bruit GI: Abdomen is soft.  Nontender nondistended.  Bowel sounds are present normal.  No masses organomegaly Extremities: No edema.  Moving all of his extremities Neurologic: Slightly distracted.  Oriented to year or month.  No focal neurological deficits.    Lab Results:  Data Reviewed: I have personally reviewed following labs and imaging studies  CBC: Recent Labs  Lab 03/04/21 1223 03/05/21 0216 03/06/21 0156 03/08/21 0359  WBC 8.7 6.9 5.4 4.6  NEUTROABS 6.7  --   --   --   HGB 16.3 14.3 15.1 15.9  HCT 49.1 41.4 44.1 45.4  MCV 89.3 84.8 84.3 83.5  PLT 238 221 219 186     Basic Metabolic Panel: Recent Labs  Lab 03/07/21 0102 03/08/21 0359 03/09/21 0054 03/10/21 0310 03/11/21 0433  NA 134* 130* 131* 132* 134*  K 4.2 4.0 3.9 4.0 4.2  CL 104 102 103 103 102  CO2 18* 20* 18* 19* 20*  GLUCOSE 227* 193* 218* 163* 153*  BUN 22 16 14 14 18   CREATININE 1.57* 1.49* 1.40* 1.45* 1.65*  CALCIUM 9.7 9.1 9.1 9.4 9.8  MG  --  1.8  --   --   --      GFR: Estimated Creatinine Clearance: 47.7 mL/min (A) (by C-G formula based on SCr of 1.65 mg/dL (H)).  Liver Function Tests: Recent Labs  Lab 03/07/21 0102  AST 15  ALT 17  ALKPHOS 83  BILITOT 0.9  PROT 6.1*  ALBUMIN 3.7     No results for input(s): LIPASE,  AMYLASE in the last 168 hours.   Coagulation Profile: Recent Labs  Lab 03/04/21 1223  INR 0.9      CBG: Recent Labs  Lab 03/09/21 1651 03/09/21 2116 03/10/21 0604 03/10/21 1216 03/10/21 1652  GLUCAP 244* 243* 199* 258* 347*     Lipid Profile: No results for input(s): CHOL, HDL, LDLCALC, TRIG, CHOLHDL, LDLDIRECT in the last 72 hours.    Recent Results (from the past 240 hour(s))  Resp Panel by RT-PCR (Flu A&B, Covid) Nasopharyngeal Swab     Status: None   Collection Time: 03/02/21  8:57 PM   Specimen: Nasopharyngeal Swab; Nasopharyngeal(NP) swabs in vial transport medium  Result Value Ref Range Status   SARS Coronavirus 2 by RT PCR NEGATIVE NEGATIVE Final    Comment: (NOTE) SARS-CoV-2 target nucleic acids are NOT DETECTED.  The SARS-CoV-2 RNA is generally detectable in upper respiratory specimens during the acute phase of infection. The lowest concentration of SARS-CoV-2 viral copies this assay can detect is 138 copies/mL. A  negative result does not preclude SARS-Cov-2 infection and should not be used as the sole basis for treatment or other patient management decisions. A negative result may occur with  improper specimen collection/handling, submission of specimen other than nasopharyngeal swab, presence of viral mutation(s) within the areas targeted by this assay, and inadequate number of viral copies(<138 copies/mL). A negative result must be combined with clinical observations, patient history, and epidemiological information. The expected result is Negative.  Fact Sheet for Patients:  EntrepreneurPulse.com.au  Fact Sheet for Healthcare Providers:  IncredibleEmployment.be  This test is no t yet approved or cleared by the [place] FDA and  has been authorized for detection and/or diagnosis of SARS-CoV-2 by FDA under an Emergency Use Authorization (EUA). This EUA will remain  in effect (meaning this test can be used)  for the duration of the COVID-19 declaration under Section 564(b)(1) of the Act, 21 U.S.C.section 360bbb-3(b)(1), unless the authorization is terminated  or revoked sooner.       Influenza A by PCR NEGATIVE NEGATIVE Final   Influenza B by PCR NEGATIVE NEGATIVE Final    Comment: (NOTE) The Xpert Xpress SARS-CoV-2/FLU/RSV plus assay is intended as an aid in the diagnosis of influenza from Nasopharyngeal swab specimens and should not be used as a sole basis for treatment. Nasal washings and aspirates are unacceptable for Xpert Xpress SARS-CoV-2/FLU/RSV testing.  Fact Sheet for Patients: EntrepreneurPulse.com.au  Fact Sheet for Healthcare Providers: IncredibleEmployment.be  This test is not yet approved or cleared by the [place] FDA and has been authorized for detection and/or diagnosis of SARS-CoV-2 by FDA under an Emergency Use Authorization (EUA). This EUA will remain in effect (meaning this test can be used) for the duration of the COVID-19 declaration under Section 564(b)(1) of the Act, 21 U.S.C. section 360bbb-3(b)(1), unless the authorization is terminated or revoked.  Performed at [facility], [address] [place], [place] [address]   CSF culture w Gram Stain     Status: None (Preliminary result)   Collection Time: 03/08/21  4:00 PM   Specimen: CSF; Cerebrospinal Fluid  Result Value Ref Range Status   Specimen Description CSF  Final   Special Requests NONE  Final   Gram Stain   Final    WBC PRESENT, PREDOMINANTLY MONONUCLEAR NO ORGANISMS SEEN CYTOSPIN SMEAR    Culture   Final    NO GROWTH 2 DAYS Performed at [facility], [place] [address] [place], [place] [address]    Report Status PENDING  Incomplete       Radiology Studies: CT CHEST ABDOMEN PELVIS W CONTRAST  Result Date: 03/09/2021 CLINICAL DATA:  Cancer of unknown primary EXAM: CT CHEST, ABDOMEN, AND PELVIS WITH CONTRAST TECHNIQUE: Multidetector CT imaging  of the chest, abdomen and pelvis was performed following the standard protocol during bolus administration of intravenous contrast. CONTRAST:  163mL OMNIPAQUE IOHEXOL 300 MG/ML  SOLN COMPARISON:  None. FINDINGS: CT CHEST FINDINGS Cardiovascular: Heart size is normal. No pericardial effusion identified. Main pulmonary artery is normal caliber. Thoracic aorta is normal in caliber with mild atherosclerotic plaques. Mediastinum/Nodes: No bulky axillary, mediastinal or hilar lymphadenopathy identified. Lungs/Pleura: No focal consolidation identified in the lungs. Mild breathing motion and subsegmental atelectatic changes. No pleural effusion or pneumothorax. Musculoskeletal: No suspicious bony lesions identified in the chest. CT ABDOMEN PELVIS FINDINGS Hepatobiliary: Liver is normal in size and contour. There is a 1.9 cm peripherally enhancing lesion in the right hepatic lobe near the dome and a 1.4 cm faint ill-defined hypodensity  in segment 4B. Gallbladder is surgically absent. No biliary ductal dilatation. Pancreas: Unremarkable. No pancreatic ductal dilatation or surrounding inflammatory changes. Spleen: Normal in size without focal abnormality. Adrenals/Urinary Tract: Adrenal glands appear normal. Normal perfusion and excretion of the right kidney and diminished perfusion and delayed excretion of the left kidney. Severe left hydroureteronephrosis secondary to a large obstructing calculus at the UVJ measuring 15 x 10 mm. Note is also made of a 4 mm calculus just proximally within the distal ureter. Associated left renal cortical thinning without significant perinephric fat stranding. A few renal cortical cysts identified measuring up to 3.2 cm on the right. Urinary bladder appears otherwise within normal limits. Stomach/Bowel: No bowel obstruction, free air or pneumatosis. No bowel wall edema or thickening identified. Moderate amount of retained fecal material throughout the colon. Appendix is normal  Vascular/Lymphatic: No significant vascular findings are present. No enlarged abdominal or pelvic lymph nodes. Reproductive: Brachytherapy seeds within the prostate gland. Other: Small umbilical hernia containing fat.  No ascites. Musculoskeletal: No suspicious bony lesions identified. Degenerative changes in the lower lumbar spine. IMPRESSION: 1. No suspicious mass or lymphadenopathy identified in the chest. 2. 1.9 cm peripherally enhancing right hepatic lobe lesion which is indeterminate, could represent hemangioma or metastasis given the history. Faint hepatic hypodensity in segment 4B could represent focal fatty infiltration. Consider follow-up hepatic protocol MRI. 3. Otherwise no suspicious mass or lymphadenopathy in the abdomen or pelvis. 4. Large likely chronically obstructing calculus in the distal left ureter at the UVJ. Severe left hydroureteronephrosis with associated renal cortical thinning and relatively diminished perfusion. Electronically Signed   By: Ofilia Neas M.D.   On: 03/09/2021 16:40       LOS: 6 days   Little Ishikawa  [facility] Pager: Secure chat  03/11/2021, 7:55 AM

## 2021-03-11 NOTE — Progress Notes (Signed)
Physical Therapy Treatment Patient Details Name: [name] MRN: [id number] DOB: Feb 18, 1956 Today's Date: 03/11/2021   History of Present Illness Pt is a 65 y/o male who presents to the ED for persistent HA and vision changes 11/24. He was d/c home and returned with persistent visual symptoms and behavior changes. MRI revealed R PCA infarct. EEG positive for seizures. PMH significant for DM II, CKD IIIa, prostate CA.    PT Comments    Pt received in supine, spouse present and encouraging, pt agreeable to therapy session and pleasantly cooperative and pleasant to mobilize now that EEG wires off. Pt making good progress toward goals this date, safer with use of RW for household distance gait trial and needing min to modA +2 for stair ascent/descent without railing support per home set-up. Pt continues to benefit from PT services to progress toward functional mobility goals. Pt and spouse report multiple family members capable of physical assist will be present to help him in and out of house and he needs up to Supervision for gait with RW support, anticipate pt safe to DC home with frequent or near constant supervision/assist initially once medically cleared and continue to recommend DME as below and HHPT.  Recommendations for follow up therapy are one component of a multi-disciplinary discharge planning process, led by the attending physician.  Recommendations may be updated based on patient status, additional functional criteria and insurance authorization.  Follow Up Recommendations  Home health PT     Assistance Recommended at Discharge Frequent or constant Supervision/Assistance  Equipment Recommendations  Rolling walker (2 wheels);BSC/3in1    Recommendations for Other Services       Precautions / Restrictions Precautions Precautions: Fall Precaution Comments: seizure; (EEG wires now off) Restrictions Weight Bearing Restrictions: No     Mobility  Bed Mobility Overal bed  mobility: Needs Assistance Bed Mobility: Rolling Rolling: Modified independent (Device/Increase time)   Supine to sit: Min guard Sit to supine: Min guard   General bed mobility comments: use of bed rail and increased time, HOB flat    Transfers Overall transfer level: Needs assistance Equipment used: None Transfers: Sit to/from Stand Sit to Stand: Min guard           General transfer comment: from EOB and wheelchair heights, no AD    Ambulation/Gait Ambulation/Gait assistance: Min guard;Supervision Gait Distance (Feet): 150 Feet Assistive device: Rolling walker (2 wheels);None Gait Pattern/deviations: Step-through pattern;Decreased stride length;Trunk flexed;Narrow base of support Gait velocity: Decreased Gait velocity interpretation: <1.31 ft/sec, indicative of household ambulator   General Gait Details: min guard with no AD for ~108ft, then switched to RW and Supervision for remainder 129ft, min cues for improved foot clearance and to widen BOS, pt leaves RW too far anterior when turning and cues for RW management needed only with turns; noted B genu varus, anticipate this is his baseline?   Stairs Stairs: Yes Stairs assistance: Min assist;Mod assist;+2 physical assistance Stair Management: No rails;Step to pattern;Forwards (bilateral handheld assist) Number of Stairs: 2 General stair comments: pt ascended two 7" steps in PT gym with B HHA (no rails per home setup), pt with posterior LOB needing up to modA to correct, otherwise minA steadying assist, pt given handout to reinforce safe step sequencing and wife receptive to instruction   Wheelchair Mobility    Modified Rankin (Stroke Patients Only) Modified Rankin (Stroke Patients Only) Pre-Morbid Rankin Score: No symptoms Modified Rankin: Moderately severe disability     Balance Overall balance assessment: Needs assistance Sitting-balance support:  Feet supported Sitting balance-Leahy Scale: Good     Standing  balance support: No upper extremity supported Standing balance-Leahy Scale: Fair Standing balance comment: static standing unsupported no LOB, needing min guard for safety without AD and supervision for gait with RW            Cognition Arousal/Alertness: Awake/alert Behavior During Therapy: Flat affect Overall Cognitive Status: Impaired/Different from baseline Area of Impairment: Problem solving;Memory;Attention;Safety/judgement                   Current Attention Level: Sustained Memory: Decreased short-term memory Following Commands: Follows one step commands with increased time;Follows one step commands consistently Safety/Judgement: Decreased awareness of safety Awareness: Emergent Problem Solving: Slow processing;Requires tactile cues;Requires verbal cues             General Comments General comments (skin integrity, edema, etc.): HR 117-120 bpm with seated/standing activity, no acute s/sx distress      Pertinent Vitals/Pain Pain Assessment: No/denies pain     PT Goals (current goals can now be found in the care plan section) Acute Rehab PT Goals PT Goal Formulation: With patient/family Time For Goal Achievement: 03/13/21 Progress towards PT goals: Progressing toward goals    Frequency    Min 4X/week      PT Plan Current plan remains appropriate    Co-evaluation              AM-PAC PT "6 Clicks" Mobility   Outcome Measure  Help needed turning from your back to your side while in a flat bed without using bedrails?: None Help needed moving from lying on your back to sitting on the side of a flat bed without using bedrails?: A Little Help needed moving to and from a bed to a chair (including a wheelchair)?: A Little Help needed standing up from a chair using your arms (e.g., wheelchair or bedside chair)?: A Little Help needed to walk in hospital room?: A Little Help needed climbing 3-5 steps with a railing? : A Lot 6 Click Score: 18     End of Session Equipment Utilized During Treatment: Gait belt Activity Tolerance: Patient tolerated treatment well Patient left: in bed;with call bell/phone within reach;with bed alarm set;with family/visitor present Nurse Communication: Mobility status PT Visit Diagnosis: Other abnormalities of gait and mobility (R26.89);Muscle weakness (generalized) (M62.81)     Time: 0947-0962 PT Time Calculation (min) (ACUTE ONLY): 19 min  Charges:  $Gait Training: 8-22 mins                     Vinetta Brach P., PTA Acute Rehabilitation Services Pager: [phone] Office: [place] 03/11/2021, 4:55 PM

## 2021-03-11 NOTE — Plan of Care (Signed)
  Problem: Education: Goal: Knowledge of disease or condition will improve Outcome: Progressing Goal: Knowledge of secondary prevention will improve (SELECT ALL) Outcome: Progressing Goal: Knowledge of patient specific risk factors will improve (INDIVIDUALIZE FOR PATIENT) Outcome: Progressing   Problem: Coping: Goal: Will verbalize positive feelings about self Outcome: Progressing   Problem: Education: Goal: Knowledge of General Education information will improve Description: Including pain rating scale, medication(s)/side effects and non-pharmacologic comfort measures 03/11/2021 2129 by Doristine Mango, RN Outcome: Progressing 03/11/2021 2128 by Doristine Mango, RN Outcome: Progressing   Problem: Health Behavior/Discharge Planning: Goal: Ability to manage health-related needs will improve 03/11/2021 2129 by Doristine Mango, RN Outcome: Progressing 03/11/2021 2128 by Doristine Mango, RN Outcome: Progressing   Problem: Clinical Measurements: Goal: Ability to maintain clinical measurements within normal limits will improve 03/11/2021 2129 by Doristine Mango, RN Outcome: Progressing 03/11/2021 2128 by Doristine Mango, RN Outcome: Progressing Goal: Will remain free from infection 03/11/2021 2129 by Doristine Mango, RN Outcome: Progressing 03/11/2021 2128 by Doristine Mango, RN Outcome: Progressing Goal: Diagnostic test results will improve 03/11/2021 2129 by Doristine Mango, RN Outcome: Progressing 03/11/2021 2128 by Doristine Mango, RN Outcome: Progressing Goal: Respiratory complications will improve 03/11/2021 2129 by Doristine Mango, RN Outcome: Progressing 03/11/2021 2128 by Doristine Mango, RN Outcome: Progressing Goal: Cardiovascular complication will be avoided 03/11/2021 2129 by Doristine Mango, RN Outcome: Progressing 03/11/2021 2128 by Doristine Mango, RN Outcome: Progressing   Problem: Activity: Goal: Risk for activity intolerance will decrease 03/11/2021  2129 by Doristine Mango, RN Outcome: Progressing 03/11/2021 2128 by Doristine Mango, RN Outcome: Progressing   Problem: Nutrition: Goal: Adequate nutrition will be maintained 03/11/2021 2129 by Doristine Mango, RN Outcome: Progressing 03/11/2021 2128 by Doristine Mango, RN Outcome: Progressing   Problem: Coping: Goal: Level of anxiety will decrease 03/11/2021 2129 by Doristine Mango, RN Outcome: Progressing 03/11/2021 2128 by Doristine Mango, RN Outcome: Progressing   Problem: Elimination: Goal: Will not experience complications related to bowel motility 03/11/2021 2129 by Doristine Mango, RN Outcome: Progressing 03/11/2021 2128 by Doristine Mango, RN Outcome: Progressing Goal: Will not experience complications related to urinary retention 03/11/2021 2129 by Doristine Mango, RN Outcome: Progressing 03/11/2021 2128 by Doristine Mango, RN Outcome: Progressing   Problem: Safety: Goal: Ability to remain free from injury will improve 03/11/2021 2129 by Doristine Mango, RN Outcome: Progressing 03/11/2021 2128 by Doristine Mango, RN Outcome: Progressing   Problem: Safety: Goal: Non-violent Restraint(s) Outcome: Completed/Met

## 2021-03-12 DIAGNOSIS — E1122 Type 2 diabetes mellitus with diabetic chronic kidney disease: Secondary | ICD-10-CM | POA: Diagnosis not present

## 2021-03-12 DIAGNOSIS — E1169 Type 2 diabetes mellitus with other specified complication: Secondary | ICD-10-CM | POA: Diagnosis not present

## 2021-03-12 DIAGNOSIS — R569 Unspecified convulsions: Secondary | ICD-10-CM | POA: Diagnosis not present

## 2021-03-12 DIAGNOSIS — N1831 Chronic kidney disease, stage 3a: Secondary | ICD-10-CM | POA: Diagnosis not present

## 2021-03-12 DIAGNOSIS — E785 Hyperlipidemia, unspecified: Secondary | ICD-10-CM | POA: Diagnosis not present

## 2021-03-12 LAB — BASIC METABOLIC PANEL
Anion gap: 13 (ref 5–15)
BUN: 22 mg/dL (ref 8–23)
CO2: 22 mmol/L (ref 22–32)
Calcium: 10.3 mg/dL (ref 8.9–10.3)
Chloride: 96 mmol/L — ABNORMAL LOW (ref 98–111)
Creatinine, Ser: 1.82 mg/dL — ABNORMAL HIGH (ref 0.61–1.24)
GFR, Estimated: 41 mL/min — ABNORMAL LOW (ref >60)
Glucose, Bld: 222 mg/dL — ABNORMAL HIGH (ref 70–99)
Potassium: 4.1 mmol/L (ref 3.5–5.1)
Sodium: 131 mmol/L — ABNORMAL LOW (ref 135–145)

## 2021-03-12 LAB — GLUCOSE, CAPILLARY
Glucose-Capillary: 180 mg/dL — ABNORMAL HIGH (ref 70–99)
Glucose-Capillary: 165 mg/dL — ABNORMAL HIGH (ref 70–99)
Glucose-Capillary: 266 mg/dL — ABNORMAL HIGH (ref 70–99)
Glucose-Capillary: 287 mg/dL — ABNORMAL HIGH (ref 70–99)

## 2021-03-12 LAB — CSF CULTURE W GRAM STAIN: Culture: NO GROWTH

## 2021-03-12 LAB — PHENOBARBITAL LEVEL: Phenobarbital: 25.6 ug/mL (ref 15.0–30.0)

## 2021-03-12 MED ORDER — BISACODYL 10 MG RE SUPP
10.0000 mg | Freq: Every day | RECTAL | Status: DC | PRN
  Administered 2021-03-12: 23:00:00 10 mg via RECTAL
  Filled 2021-03-12: qty 1

## 2021-03-12 NOTE — Plan of Care (Signed)
  Problem: Education: Goal: Knowledge of General Education information will improve Description: Including pain rating scale, medication(s)/side effects and non-pharmacologic comfort measures Outcome: Progressing   Problem: Health Behavior/Discharge Planning: Goal: Ability to manage health-related needs will improve Outcome: Progressing   Problem: Clinical Measurements: Goal: Ability to maintain clinical measurements within normal limits will improve Outcome: Progressing Goal: Will remain free from infection Outcome: Progressing Goal: Diagnostic test results will improve Outcome: Progressing Goal: Respiratory complications will improve Outcome: Progressing Goal: Cardiovascular complication will be avoided Outcome: Progressing   Problem: Activity: Goal: Risk for activity intolerance will decrease Outcome: Progressing   Problem: Nutrition: Goal: Adequate nutrition will be maintained Outcome: Progressing   Problem: Coping: Goal: Level of anxiety will decrease Outcome: Progressing   Problem: Elimination: Goal: Will not experience complications related to bowel motility Outcome: Progressing Goal: Will not experience complications related to urinary retention Outcome: Progressing   Problem: Safety: Goal: Ability to remain free from injury will improve Outcome: Progressing   Problem: Education: Goal: Knowledge of disease or condition will improve Outcome: Progressing Goal: Knowledge of secondary prevention will improve (SELECT ALL) Outcome: Progressing Goal: Knowledge of patient specific risk factors will improve (INDIVIDUALIZE FOR PATIENT) Outcome: Progressing   Problem: Coping: Goal: Will verbalize positive feelings about self Outcome: Progressing

## 2021-03-12 NOTE — Plan of Care (Signed)
Summary of LTM EEG report from yesterday: Brief ictal-interictal rhythmic discharges (BIRDs), right hemisphere, maximal right temporoparietal region. The findings are most consistent with cortical dysfunction and epileptogenicity arising from right hemisphere, maximal right temporoparietal region likely due to underlying structural abnormality.  Brief ictal-interictal rhythmic discharges were also noted in right hemisphere, maximal right temporoparietal region which are on the ictal-interictal continuum, with high potential for seizure recurrence. No definite seizures were seen during this study.   LTM was discontinued yesterday.   Routine EEG ordered for Monday AM to assess for possible resolution of the BIRDs.  Continue with current anticonvulsant regimen.   Electronically signed: Dr. Kerney Elbe

## 2021-03-12 NOTE — Progress Notes (Signed)
[facility] PROGRESS NOTE   [name] JJO:[id number] DOB: 10-23-1955 DOA: 03/04/2021  PCP: [name], DO  Brief History/Interval Summary: 65 y.o. male with medical history significant for type 2 diabetes, HLD, CKD stage IIIa, prostate cancer who presented to the ED for persistent headache and change in vision.  Initially presented on 11/24.  MRI brain was limited evaluation due to patient's intolerance.  Patient was discharged home.  Came back with persistent visual deficits.  Repeat MRI showed an acute stroke in the occipital lobe.  Patient was hospitalized for further management.  Patient seen by neurology.  His visual symptoms were concerning also for seizure activity.  EEG confirmed this.  Repeat MRI shows no significant stroke.  Consultants: Neurology  Procedures:  Transthoracic echocardiogram EEG  Subjective/Interval History: No acute issues or events overnight -appears patient's previous EEG was not wholly unremarkable -patient and wife voicing concerns about prolonged ongoing hospitalization which we discussed was due to the need for ongoing evaluation and work-up per neurology.  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 Currently on Phenobarbital, vimpat, depakote (previous phenytoin discontinued due to transient rash), and PRN alprazolam  Continue statin/aspirin  Seizures, ongoing as above Neuro following as above EEG continues to show ongoing epileptogenicity with ictal/interictal rhythmic discharges per neurology -repeat EEG planned for 03/13/2021  Abnormal liver lesion Discussed case w/ Dr Kale(Oncology) - will follow repeat PSA; follow MRI if able to obtain prior to  discharge (currently on hold due to ongoing EEG) and potentially obtain biopsy if area is ruled out as a hemangioma  Chronic R ureter obstruction -Discussed with Urology - given likely chronic, asymptomatic, and non-infected stone can follow up outpatient with established urology for ongoing follow-up.  Anxiety - Xanax as needed given above - low dose and sparingly given age/mental status changes from baseline.  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   will resume and follow rHe was on metformin and Jardiance prior to admission -wife indicates he was moderately noncompliant with these medications -  Family requesting to be discharged on previous home regiment with improved diet which we discussed would be difficult given his elevated A1c but if he can adjust his diet appropriately and see his PCP within 5 days of discharge it may be a reasonable request.  Chronic kidney disease stage IIIa/metabolic acidosis, stable Labs remained stable  Essential hypertension/hyponatremia Holding ACE inhibitor.  Allowing some degree of permissive hypertension.  Patient is reasonably well controlled. Sodium level is stable.  Hyperlipidemia Continue statin.   DVT Prophylaxis: Subcutaneous heparin Code Status: Full code Family Communication: Wife at bedside Disposition Plan: Return home once medically stable  Status is: Inpatient  Remains inpatient appropriate because: Stroke, acute seizures  Medications: Scheduled:  aspirin  81 mg Oral Daily   atorvastatin  20 mg Oral QHS   divalproex  500 mg Oral BID   empagliflozin  25 mg Oral q morning   enoxaparin (LOVENOX) injection  40 mg Subcutaneous Q24H   insulin aspart  0-9 Units Subcutaneous TID WC   lacosamide  200 mg Oral BID   melatonin  10 mg Oral Daily   metFORMIN  500 mg Oral BID WC   pantoprazole  40 mg Oral q  morning   PHENobarbital  194.4 mg Oral Q1200   vitamin B-12   500 mcg Oral Daily    Objective:  Vital Signs  Vitals:   03/11/21 1617 03/11/21 2100 03/11/21 2355 03/12/21 0419  BP: (!) 131/94 124/82 131/86 129/86  Pulse: (!) 101 99 (!) 104 (!) 102  Resp:  18 18 20   Temp: 97.7 F (36.5 C) 97.7 F (36.5 C) 97.9 F (36.6 C) 97.7 F (36.5 C)  TempSrc: Oral Oral Oral Oral  SpO2: 99% 98% 96% 95%  Weight:      Height:        Intake/Output Summary (Last 24 hours) at 03/12/2021 0805 Last data filed at 03/11/2021 0900 Gross per 24 hour  Intake 150 ml  Output --  Net 150 ml    Filed Weights   03/04/21 2040  Weight: 86.2 kg    General appearance: Awake alert.  In no distress.  Somewhat distracted but more appropriately answering questions Resp: Clear to auscultation bilaterally.  Normal effort Cardio: S1-S2 is normal regular.  No S3-S4.  No rubs murmurs or bruit GI: Abdomen is soft.  Nontender nondistended.  Bowel sounds are present normal.  No masses organomegaly Extremities: No edema.  Moving all of his extremities Neurologic: Slightly distracted.  Oriented to year or month.  No focal neurological deficits.    Lab Results:  Data Reviewed: I have personally reviewed following labs and imaging studies  CBC: Recent Labs  Lab 03/06/21 0156 03/08/21 0359  WBC 5.4 4.6  HGB 15.1 15.9  HCT 44.1 45.4  MCV 84.3 83.5  PLT 219 186     Basic Metabolic Panel: Recent Labs  Lab 03/08/21 0359 03/09/21 0054 03/10/21 0310 03/11/21 0433 03/12/21 0243  NA 130* 131* 132* 134* 131*  K 4.0 3.9 4.0 4.2 4.1  CL 102 103 103 102 96*  CO2 20* 18* 19* 20* 22  GLUCOSE 193* 218* 163* 153* 222*  BUN 16 14 14 18 22   CREATININE 1.49* 1.40* 1.45* 1.65* 1.82*  CALCIUM 9.1 9.1 9.4 9.8 10.3  MG 1.8  --   --   --   --      GFR: Estimated Creatinine Clearance: 43.2 mL/min (A) (by C-G formula based on SCr of 1.82 mg/dL (H)).  Liver Function Tests: Recent Labs  Lab 03/07/21 0102  AST 15  ALT 17  ALKPHOS 83  BILITOT 0.9  PROT 6.1*  ALBUMIN  3.7     No results for input(s): LIPASE, AMYLASE in the last 168 hours.   Coagulation Profile: No results for input(s): INR, PROTIME in the last 168 hours.    CBG: Recent Labs  Lab 03/10/21 1216 03/10/21 1652 03/11/21 1715 03/11/21 2204 03/12/21 0609  GLUCAP 258* 347* 181* 183* 180*     Lipid Profile: No results for input(s): CHOL, HDL, LDLCALC, TRIG, CHOLHDL, LDLDIRECT in the last 72 hours.    Recent Results (from the past 240 hour(s))  Resp Panel by RT-PCR (Flu A&B, Covid) Nasopharyngeal Swab     Status: None   Collection Time: 03/02/21  8:57 PM   Specimen: Nasopharyngeal Swab; Nasopharyngeal(NP) swabs in vial transport medium  Result Value Ref Range Status   SARS Coronavirus 2 by RT PCR NEGATIVE NEGATIVE Final    Comment: (NOTE) SARS-CoV-2 target nucleic acids are NOT DETECTED.  The SARS-CoV-2 RNA is generally detectable in upper respiratory specimens during the acute phase of infection. The lowest concentration of SARS-CoV-2 viral copies this assay can detect is 138 copies/mL. A negative  result does not preclude SARS-Cov-2 infection and should not be used as the sole basis for treatment or other patient management decisions. A negative result may occur with  improper specimen collection/handling, submission of specimen other than nasopharyngeal swab, presence of viral mutation(s) within the areas targeted by this assay, and inadequate number of viral copies(<138 copies/mL). A negative result must be combined with clinical observations, patient history, and epidemiological information. The expected result is Negative.  Fact Sheet for Patients:  EntrepreneurPulse.com.au  Fact Sheet for Healthcare Providers:  IncredibleEmployment.be  This test is no t yet approved or cleared by the [place] FDA and  has been authorized for detection and/or diagnosis of SARS-CoV-2 by FDA under an Emergency Use Authorization (EUA). This  EUA will remain  in effect (meaning this test can be used) for the duration of the COVID-19 declaration under Section 564(b)(1) of the Act, 21 U.S.C.section 360bbb-3(b)(1), unless the authorization is terminated  or revoked sooner.       Influenza A by PCR NEGATIVE NEGATIVE Final   Influenza B by PCR NEGATIVE NEGATIVE Final    Comment: (NOTE) The Xpert Xpress SARS-CoV-2/FLU/RSV plus assay is intended as an aid in the diagnosis of influenza from Nasopharyngeal swab specimens and should not be used as a sole basis for treatment. Nasal washings and aspirates are unacceptable for Xpert Xpress SARS-CoV-2/FLU/RSV testing.  Fact Sheet for Patients: EntrepreneurPulse.com.au  Fact Sheet for Healthcare Providers: IncredibleEmployment.be  This test is not yet approved or cleared by the [place] FDA and has been authorized for detection and/or diagnosis of SARS-CoV-2 by FDA under an Emergency Use Authorization (EUA). This EUA will remain in effect (meaning this test can be used) for the duration of the COVID-19 declaration under Section 564(b)(1) of the Act, 21 U.S.C. section 360bbb-3(b)(1), unless the authorization is terminated or revoked.  Performed at [facility], [address], [place], [place] [address]   CSF culture w Gram Stain     Status: None (Preliminary result)   Collection Time: 03/08/21  4:00 PM   Specimen: CSF; Cerebrospinal Fluid  Result Value Ref Range Status   Specimen Description CSF  Final   Special Requests NONE  Final   Gram Stain   Final    WBC PRESENT, PREDOMINANTLY MONONUCLEAR NO ORGANISMS SEEN CYTOSPIN SMEAR    Culture   Final    NO GROWTH 3 DAYS Performed at [facility], [place] [address] [place], [place] [address]    Report Status PENDING  Incomplete       Radiology Studies: Overnight EEG with video  Result Date: 03/11/2021 Lora Havens, MD     03/11/2021  9:02 AM Patient Name: [name] MRN:  [id number] Epilepsy Attending: Lora Havens Referring Physician/Provider: Dr Rosalin Hawking Duration: 03/10/2021 1034 to 03/11/2021 0900  Patient history: 65yo M with right PCA stroke and visual hallucinations. EEG to evaluate for seizure.  Level of alertness: Awake, asleep  AEDs during EEG study: Phenobarb,Vimpat, Clonazepam, VPA  Technical aspects: This EEG study was done with scalp electrodes positioned according to the 10-20 International system of electrode placement. Electrical activity was acquired at a sampling rate of 500Hz  and reviewed with a high frequency filter of 70Hz  and a low frequency filter of 1Hz . EEG data were recorded continuously and digitally stored.  Description: No clear posterior dominant rhythm consists of 8 Hz activity of moderate voltage (25-35 uV) seen predominantly in posterior head regions, symmetric and reactive to eye opening and eye closing. Sleep  was characterized by vertex waves, sleep spindles (12 to 14 Hz), maximal frontocentral region.   EEG also showed intermittent lateralized periodic discharges in right hemisphere, maximal right temporoparietal region at 1 Hz which at times appeared rhythmic consistent with brief ictal-interictal rhythmic discharges.  Hyperventilation and photic stimulation were not performed.  ABNORMALITY -Brief ictal-interictal rhythmic discharges, right hemisphere, maximal right temporoparietal region  IMPRESSION: This study showed evidence of cortical dysfunction and epileptogenicity arising from right hemisphere, maximal right temporoparietal region likely due to underlying structural abnormality.  Brief ictal-interictal rhythmic discharges were also noted in right hemisphere, maximal right temporoparietal region which are on the ictal-interictal continuum, with high potential for seizure recurrence. No definite seizures were seen during this study.  Lora Havens       LOS: 7 days   Little Ishikawa  [facility] Pager: Secure  chat  03/12/2021, 8:05 AM

## 2021-03-12 NOTE — TOC Transition Note (Signed)
Transition of Care [facility]) - CM/SW Discharge Note   Patient Details  Name: [name] MRN: [id number] Date of Birth: [birth year]  [facility] CM/SW Contact:  Carles Collet, RN Phone Number: 03/12/2021, 10:58 AM   Clinical Narrative:    Spoke w apouse, she endorses needing DME RW and 3/1. Ordered through [employer] to be delivered to the room today. Notified [facility] of potential DC later today, no other CM needs identified at this time    Final next level of care: [place] Barriers to Discharge: No Barriers Identified   Patient Goals and CMS Choice Patient states their goals for this hospitalization and ongoing recovery are:: to g ohome CMS Medicare.gov Compare Post Acute Care list provided to:: Patient Represenative (must comment) Choice offered to / list presented to : Spouse  Discharge Placement                       Discharge Plan and Services   Discharge Planning Services: CM Consult Post Acute Care Choice: Home Health, Durable Medical Equipment          DME Arranged: 3-N-1, [place] rolling DME Agency: [employer] Date DME Agency Contacted: 03/12/21 Time DME Agency Contacted: [phone] Representative spoke with at DME Agency: [place]: PT, OT [place] Agency: [place] Date [place]: 03/12/21 Time [place]: 1058 Representative spoke with at [place]: [place] ([place]) Interventions     Readmission Risk Interventions No flowsheet data found.

## 2021-03-12 NOTE — Progress Notes (Signed)
Neurology Progress Note   Subjective: No acute overnight events Patient and patient's wife requested updates regarding EEG Monday, answered all questions  Exam: Vitals:   03/12/21 0806 03/12/21 1200  BP: 117/63 (!) 145/99  Pulse: (!) 114 (!) 115  Resp: 16 16  Temp: 98.6 F (37 C) 98.5 F (36.9 C)  SpO2: 99% 98%   Gen: Sitting up in bed, eating lunch, in no acute distress Resp: non-labored breathing, no respiratory distress Abd: soft, non-tender, non-distended  Neuro: Mental Status: Awake, alert and oriented. Speech is fluent without dysarthria or aphasia.  Good attention noted. Follows commands.  Cranial Nerves: PERRL, EOMI with some left visual field neglect, face is symmetric resting and with movement, tongue is midline, hearing is intact to voice.  Motor: Moves all extremities spontaneously and with antigravity movement Gait: Deferred  Pertinent Labs: CBC    Component Value Date/Time   WBC 4.6 03/08/2021 0359   RBC 5.44 03/08/2021 0359   HGB 15.9 03/08/2021 0359   HCT 45.4 03/08/2021 0359   PLT 186 03/08/2021 0359   MCV 83.5 03/08/2021 0359   MCH 29.2 03/08/2021 0359   MCHC 35.0 03/08/2021 0359   RDW 12.4 03/08/2021 0359   LYMPHSABS 1.3 03/04/2021 1223   MONOABS 0.7 03/04/2021 1223   EOSABS 0.0 03/04/2021 1223   BASOSABS 0.0 03/04/2021 1223   CMP     Component Value Date/Time   NA 131 (L) 03/12/2021 0243   K 4.1 03/12/2021 0243   CL 96 (L) 03/12/2021 0243   CO2 22 03/12/2021 0243   GLUCOSE 222 (H) 03/12/2021 0243   BUN 22 03/12/2021 0243   CREATININE 1.82 (H) 03/12/2021 0243   CALCIUM 10.3 03/12/2021 0243   PROT 6.1 (L) 03/07/2021 0102   ALBUMIN 3.7 03/07/2021 0102   AST 15 03/07/2021 0102   ALT 17 03/07/2021 0102   ALKPHOS 83 03/07/2021 0102   BILITOT 0.9 03/07/2021 0102   GFRNONAA 41 (L) 03/12/2021 0243   GFRAA 56 (L) 01/25/2018 0559    Latest Reference Range & Units 03/12/21 02:43  PHENOBARBITAL 15.0 - 30.0 ug/mL 25.6    Latest Reference  Range & Units 03/10/21 16:14  Valproic Acid,S 50.0 - 100.0 ug/mL 39 (L)   Imaging Reviewed:  EEG 12/2 - 12/3: "This study showed evidence of cortical dysfunction and epileptogenicity arising from right hemisphere, maximal right temporoparietal region likely due to underlying structural abnormality.  Brief ictal-interictal rhythmic discharges were also noted in right hemisphere, maximal right temporoparietal region which are on the ictal-interictal continuum, with high potential for seizure recurrence. No definite seizures were seen during this study."  MRI brain wo contrast 03/07/2021: 1. Motion degraded, incomplete examination. 2. Abnormal cortically based diffusion signal in the occipital and temporal lobes, greater in extent than on the recent prior MRI and favored to reflect the sequelae of recent seizure activity although a component of acute ischemia is also possible. Subcortical T2 hypointensity in these regions is of indeterminate etiology but has been reported with nonketotic hyperglycemia and occipital seizures as well as ischemia and encephalitis.  Assessment: 65 year old male who has had staring spells for about 2 weeks to 2 months (unclear exact duration but wife thinks its most likely 2 weeks).  Initially presented on 03/02/2021 with hemianopsia for 3 days.  Initial MRI was negative, this was suspected to be due to migraine.  He then presented again on 03/04/2021 with continued hemianopsia.  Repeat MRI showed restricted diffusion in right occipital region but looks atypical for stroke.  LTM EEG was obtained which showed seizures arising from right temporoparietal region.   New onset epilepsy -LTM EEG continues to show evidence of cortical dysfunction and epileptogenicity arising from right hemisphere, maximal right temporoparietal region likely due to underlying structural abnormality.  Brief ictal-interictal rhythmic discharges were also noted in right hemisphere, maximal right  temporoparietal region which are on the ictal-interictal continuum, with high potential for seizure recurrence. -No clear etiology for new onset of seizures in this patient.  Lumbar puncture so far is not suggestive of infection.  Differentials include autoimmune versus paraneoplastic causes.     Recommendations: -Continue phenobarbital 65 mg twice daily, VPA 500mg  BID  and Vimpat 200 mg twice daily -Repeat EEG 12/5  -Continue seizure precautions, delirium precautions -Continue management of rest of comorbidities per primary team  Seizure precautions: Per [facility] statutes, patients with seizures are not allowed to drive until they have been seizure-free for six months and cleared by a physician. Use caution when using heavy equipment or power tools. Avoid working on ladders or at heights. Take showers instead of baths. Ensure the water temperature is not too high on the home water heater. Do not go swimming alone. Do not lock yourself in a room alone (i.e. bathroom). When caring for infants or small children, sit down when holding, feeding, or changing them to minimize risk of injury to the child in the event you have a seizure. Maintain good sleep hygiene. Avoid alcohol.    If patient has another seizure, call 911 and bring them back to the ED if: A.  The seizure lasts longer than 5 minutes.      B.  The patient doesn't wake shortly after the seizure or has new problems such as difficulty seeing, speaking or moving following the seizure C.  The patient was injured during the seizure D.  The patient has a temperature over 102 F (39C) E.  The patient vomited during the seizure and now is having trouble breathing    During the Seizure   - First, ensure adequate ventilation and place patients on the floor on their left side  Loosen clothing around the neck and ensure the airway is patent. If the patient is clenching the teeth, do not force the mouth open with any object as this can  cause severe damage - Remove all items from the surrounding that can be hazardous. The patient may be oblivious to what's happening and may not even know what he or she is doing. If the patient is confused and wandering, either gently guide him/her away and block access to outside areas - Reassure the individual and be comforting - Call 911. In most cases, the seizure ends before EMS arrives. However, there are cases when seizures may last over 3 to 5 minutes. Or the individual may have developed breathing difficulties or severe injuries. If a pregnant patient or a person with diabetes develops a seizure, it is prudent to call an ambulance. - Finally, if the patient does not regain full consciousness, then call EMS. Most patients will remain confused for about 45 to 90 minutes after a seizure, so you must use judgment in calling for help. - Avoid restraints but make sure the patient is in a bed with padded side rails - Place the individual in a lateral position with the neck slightly flexed; this will help the saliva drain from the mouth and prevent the tongue from falling backward - Remove all nearby furniture and other hazards from the  area - Provide verbal assurance as the individual is regaining consciousness - Provide the patient with privacy if possible - Call for help and start treatment as ordered by the caregiver    After the Seizure (Postictal Stage)   After a seizure, most patients experience confusion, fatigue, muscle pain and/or a headache. Thus, one should permit the individual to sleep. For the next few days, reassurance is essential. Being calm and helping reorient the person is also of importance.   Most seizures are painless and end spontaneously. Seizures are not harmful to others but can lead to complications such as stress on the lungs, brain and the heart. Individuals with prior lung problems may develop labored breathing and respiratory distress.   Anibal Henderson,  AGACNP-BC [facility] [phone]  Electronically signed: Dr. Kerney Elbe

## 2021-03-13 ENCOUNTER — Other Ambulatory Visit (HOSPITAL_COMMUNITY): Payer: Self-pay

## 2021-03-13 ENCOUNTER — Inpatient Hospital Stay (HOSPITAL_COMMUNITY): Payer: Medicare Other

## 2021-03-13 DIAGNOSIS — N1831 Chronic kidney disease, stage 3a: Secondary | ICD-10-CM | POA: Diagnosis not present

## 2021-03-13 DIAGNOSIS — E1169 Type 2 diabetes mellitus with other specified complication: Secondary | ICD-10-CM | POA: Diagnosis not present

## 2021-03-13 DIAGNOSIS — E785 Hyperlipidemia, unspecified: Secondary | ICD-10-CM | POA: Diagnosis not present

## 2021-03-13 DIAGNOSIS — R569 Unspecified convulsions: Secondary | ICD-10-CM | POA: Diagnosis not present

## 2021-03-13 DIAGNOSIS — E1122 Type 2 diabetes mellitus with diabetic chronic kidney disease: Secondary | ICD-10-CM | POA: Diagnosis not present

## 2021-03-13 LAB — GLUCOSE, CAPILLARY
Glucose-Capillary: 123 mg/dL — ABNORMAL HIGH (ref 70–99)
Glucose-Capillary: 229 mg/dL — ABNORMAL HIGH (ref 70–99)
Glucose-Capillary: 172 mg/dL — ABNORMAL HIGH (ref 70–99)
Glucose-Capillary: 262 mg/dL — ABNORMAL HIGH (ref 70–99)
Glucose-Capillary: 192 mg/dL — ABNORMAL HIGH (ref 70–99)

## 2021-03-13 LAB — PHENOBARBITAL LEVEL: Phenobarbital: 27.3 ug/mL (ref 15.0–30.0)

## 2021-03-13 IMAGING — DX DG ABDOMEN 1V
1 series · 1 of 1 positions shown · non-contrast
Comparison: Recent chest, abdomen and pelvis CT with contrast
[DATE].

CLINICAL DATA: Abdominal pain.

EXAM:
ABDOMEN - 1 VIEW

[abdomen kub]
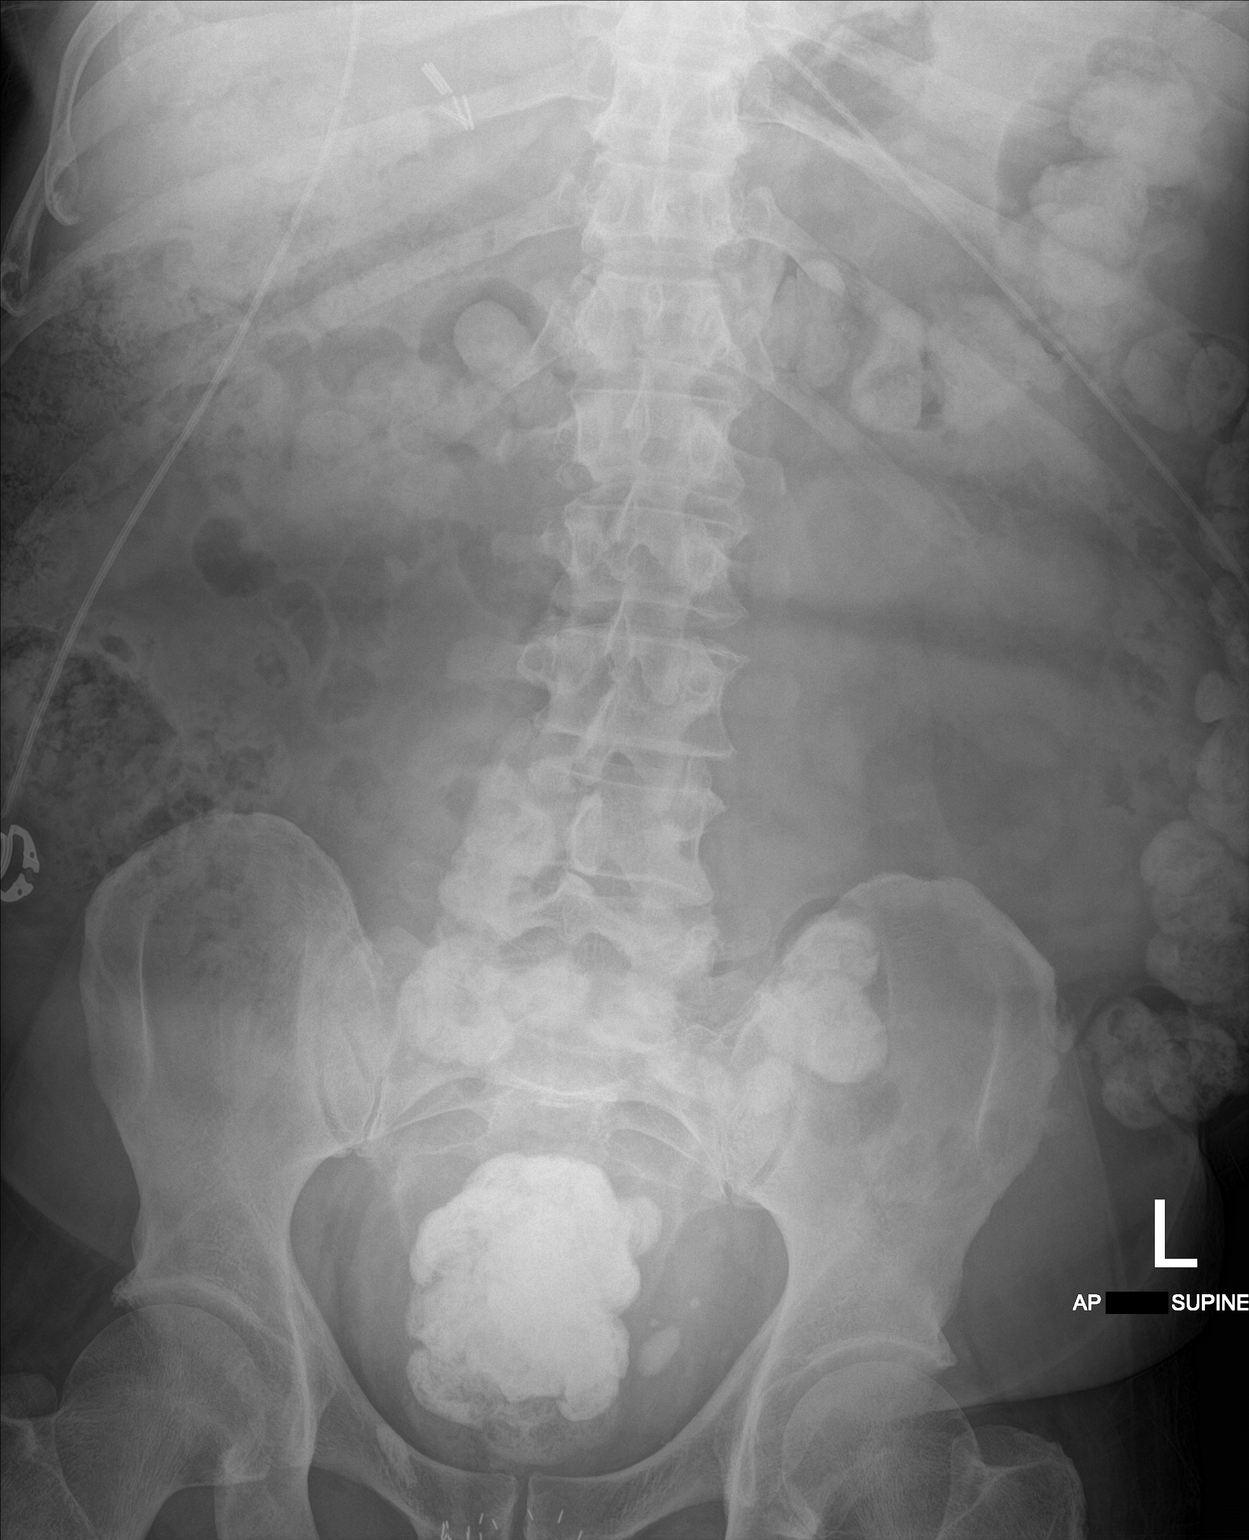

[1 of 1 positions shown; findings below may reference images not displayed]

FINDINGS: Nonobstructive overall bowel pattern with moderate stool retention
again seen. Contrast is present in the transverse and left-sided
colon and the rectum.

Oval left pelvic 1.5 x 1 cm distal ureteral stone is again noted as
well as a few phleboliths. No new calcification is evident. There
are right upper quadrant surgical clips.

Visceral shadows are stable. There is no supine evidence of free
air.
IMPRESSION: 1. Constipation.
2. 1.5 x 1.0 cm oval distal left ureteral stone.
3. No acute radiographic findings.

## 2021-03-13 MED ORDER — PHENOBARBITAL 97.2 MG PO TABS
194.4000 mg | ORAL_TABLET | Freq: Every day | ORAL | 1 refills | Status: DC
  Filled 2021-03-13: qty 60, 30d supply, fill #0

## 2021-03-13 MED ORDER — LACOSAMIDE 200 MG PO TABS
200.0000 mg | ORAL_TABLET | Freq: Two times a day (BID) | ORAL | 1 refills | Status: DC
  Filled 2021-03-13: qty 60, 30d supply, fill #0

## 2021-03-13 MED ORDER — DIVALPROEX SODIUM 500 MG PO DR TAB
500.0000 mg | DELAYED_RELEASE_TABLET | Freq: Two times a day (BID) | ORAL | 1 refills | Status: DC
  Filled 2021-03-13: qty 60, 30d supply, fill #0

## 2021-03-13 MED ORDER — DIVALPROEX SODIUM 500 MG PO DR TAB: 500.0000 mg | DELAYED_RELEASE_TABLET | Freq: Two times a day (BID) | ORAL | 2 refills | Status: DC

## 2021-03-13 MED ORDER — LACOSAMIDE 200 MG PO TABS
200.0000 mg | ORAL_TABLET | Freq: Two times a day (BID) | ORAL | 3 refills | Status: DC
  Filled 2021-03-13: qty 60, 30d supply, fill #0

## 2021-03-13 MED ORDER — PHENOBARBITAL 97.2 MG PO TABS: 194.4000 mg | ORAL_TABLET | Freq: Every day | ORAL | 2 refills | Status: DC

## 2021-03-13 MED ORDER — FLEET ENEMA 7-19 GM/118ML RE ENEM
1.0000 | ENEMA | Freq: Once | RECTAL | Status: AC
  Administered 2021-03-13: 1 via RECTAL
  Filled 2021-03-13: qty 1

## 2021-03-13 MED ORDER — DIVALPROEX SODIUM 500 MG PO DR TAB
500.0000 mg | DELAYED_RELEASE_TABLET | Freq: Two times a day (BID) | ORAL | 3 refills | Status: DC
  Filled 2021-03-13: qty 60, 30d supply, fill #0

## 2021-03-13 MED ORDER — LACOSAMIDE 200 MG PO TABS: 200.0000 mg | ORAL_TABLET | Freq: Two times a day (BID) | ORAL | 2 refills | Status: DC

## 2021-03-13 MED ORDER — ASPIRIN 81 MG PO CHEW
81.0000 mg | CHEWABLE_TABLET | Freq: Every day | ORAL | 1 refills | Status: DC
Start: 2021-03-14 — End: 2021-03-29

## 2021-03-13 MED ORDER — ASPIRIN 81 MG PO CHEW
81.0000 mg | CHEWABLE_TABLET | Freq: Every day | ORAL | 0 refills | Status: AC
  Filled 2021-03-13 (×2): qty 30, 30d supply, fill #0

## 2021-03-13 MED ORDER — PHENOBARBITAL 97.2 MG PO TABS
194.4000 mg | ORAL_TABLET | Freq: Every day | ORAL | 3 refills | Status: DC
  Filled 2021-03-13: qty 60, 30d supply, fill #0

## 2021-03-13 NOTE — Progress Notes (Signed)
Inpatient Diabetes Program Recommendations  AACE/ADA: New Consensus Statement on Inpatient Glycemic Control (2015)  Target Ranges:  Prepandial:   less than 140 mg/dL      Peak postprandial:   less than 180 mg/dL (1-2 hours)      Critically ill patients:  140 - 180 mg/dL   Lab Results  Component Value Date   GLUCAP 262 (H) 03/13/2021   HGBA1C 13.5 (H) 03/04/2021    Review of Glycemic Control  Latest Reference Range & Units 03/11/21 17:15 03/11/21 22:04 03/12/21 06:09 03/12/21 12:29 03/12/21 17:33 03/12/21 21:37 03/13/21 06:19  Glucose-Capillary 70 - 99 mg/dL 181 (H) 183 (H) 180 (H) 165 (H) 266 (H) 287 (H) 262 (H)   Diabetes history: DM 2 Outpatient Diabetes medications:  Jardiance 25 mg daily, Metformin 500 mg bid Current orders for Inpatient glycemic control:  Novolog sensitive tid with meals Jardiance 25 mg daily Metformin 500 mg bid  Inpatient Diabetes Program Recommendations:    Please increase Novolog correction to moderate tid with meals.  Note patient refusing insulin at home. May consider adding low dose basal insulin while in the hospital if appropriate.  Consider adding Semglee 10 units daily.   Thanks,  Adah Perl, RN, BC-ADM Inpatient Diabetes Coordinator Pager [phone]  (8a-5p)

## 2021-03-13 NOTE — Procedures (Addendum)
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 EEG to evaluate for seizure.    Level of alertness: Awake   AEDs during EEG study: VPA, LCM, Phenobarb   Technical aspects: This EEG study was done with scalp electrodes positioned according to the 10-20 International system of electrode placement. Electrical activity was acquired at a sampling rate of 500Hz  and reviewed with a high frequency filter of 70Hz  and a low frequency filter of 1Hz . EEG data were recorded continuously and digitally stored.    Description: No clear posterior dominant rhythm consists of 8 Hz activity of moderate voltage (25-35 uV) seen predominantly in posterior head regions, symmetric and reactive to eye opening and eye closing. EEG showed intermittent 3-5 Hz theta-delta slowing in right hemisphere, maximal right temporoparietal region which at times appears sharply contoured.    Hyperventilation and photic stimulation were not performed.      ABNORMALITY -Intermittent slow, right hemisphere, maximal right temporo-parietal region.   IMPRESSION: This study showed cortical dysfunction arising from right hemisphere, maximal right temporoparietal region, nonspecific in etiology but most likely secondary to underlying structural abnormality.  No definite seizures or epileptiform discharges were seen during the study.  EEG appears to be significantly improved compared to previous studies.  Eran Windish [name]

## 2021-03-13 NOTE — Plan of Care (Signed)
  Problem: Education: Goal: Knowledge of disease or condition will improve Outcome: Progressing Goal: Knowledge of secondary prevention will improve (SELECT ALL) Outcome: Progressing Goal: Knowledge of patient specific risk factors will improve (INDIVIDUALIZE FOR PATIENT) Outcome: Progressing   Problem: Coping: Goal: Will verbalize positive feelings about self Outcome: Progressing

## 2021-03-13 NOTE — TOC Transition Note (Signed)
Transition of Care [facility]) - CM/SW Discharge Note   Patient Details  Name: [name] MRN: [id number] Date of Birth: 18-Jan-1956  Transition of Care [facility]) CM/SW Contact:  Pollie Friar, RN Phone Number: 03/13/2021, 11:26 AM   Clinical Narrative:    Patient is discharging home with home health services through [place]. Cory with [facility] is aware of dc.  [place] and 3 in 1 for home are at the bedside.  Wife is able to provide transport home.    Final next level of care: [place] Barriers to Discharge: No Barriers Identified   Patient Goals and CMS Choice Patient states their goals for this hospitalization and ongoing recovery are:: to g ohome CMS Medicare.gov Compare Post Acute Care list provided to:: Patient Represenative (must comment) Choice offered to / list presented to : Spouse  Discharge Placement                       Discharge Plan and Services   Discharge Planning Services: CM Consult Post Acute Care Choice: Home Health, Durable Medical Equipment          DME Arranged: 3-N-1, [place] rolling DME Agency: [employer] Date DME Agency Contacted: 03/12/21 Time DME Agency Contacted: 1157 Representative spoke with at DME Agency: [place]: PT, OT [place] Agency: [place] Date [place]: 03/12/21 Time [place]: [id number] Representative spoke with at [place]: [place] ([place]) Interventions     Readmission Risk Interventions No flowsheet data found.

## 2021-03-13 NOTE — Progress Notes (Signed)
[name]  D/C'd Home per MD order.  Discussed with the patient and all questions fully answered.  VSS, Skin clean, dry and intact without evidence of skin break down, no evidence of skin tears noted. IV catheter discontinued intact. Site without signs and symptoms of complications. Dressing and pressure applied.  An After Visit Summary was printed and given to the patient. Patient received prescription medication from inpatient pharmcy.  D/c education completed with patient/family including follow up instructions, medication list, d/c activities limitations if indicated, with other d/c instructions as indicated by MD - patient able to verbalize understanding, all questions fully answered.   Patient instructed to return to ED, call 911, or call MD for any changes in condition.   Patient escorted via [place], and D/C home via private auto.  [name] 03/13/2021 1:59 PM

## 2021-03-13 NOTE — Progress Notes (Signed)
EEG done at bedside. No skin breakdown noted. Results pending. 

## 2021-03-13 NOTE — Progress Notes (Signed)
Subjective: No further seizures overnight.  Patient states he is feeling well.  Per wife, patient is little more drowsy than usual but otherwise doing well.    ROS: negative except above Examination  Vital signs in last 24 hours: Temp:  [98.1 F (36.7 C)-98.9 F (37.2 C)] 98.2 F (36.8 C) (12/05 0926) Pulse Rate:  [115-123] 123 (12/05 0926) Resp:  [16-20] 17 (12/05 0926) BP: (130-148)/(82-99) 130/86 (12/05 0926) SpO2:  [95 %-100 %] 97 % (12/05 0926)  General: lying in bed, NAD CVS: pulse-normal rate and rhythm RS: breathing comfortably, CTAB Skin: warm , red maculopapular rash on his back   Neuro: MS: Alert, orientedX3, follows commands CN: pupils equal and reactive,  EOMI, left visual neglect is not resolved, face symmetric, tongue midline, normal sensation over face Motor: 5/5 strength in all extremities except 5-/5 in LUE  Basic Metabolic Panel: Recent Labs  Lab 03/08/21 0359 03/09/21 0054 03/10/21 0310 03/11/21 0433 03/12/21 0243  NA 130* 131* 132* 134* 131*  K 4.0 3.9 4.0 4.2 4.1  CL 102 103 103 102 96*  CO2 20* 18* 19* 20* 22  GLUCOSE 193* 218* 163* 153* 222*  BUN 16 14 14 18 22   CREATININE 1.49* 1.40* 1.45* 1.65* 1.82*  CALCIUM 9.1 9.1 9.4 9.8 10.3  MG 1.8  --   --   --   --     CBC: Recent Labs  Lab 03/08/21 0359  WBC 4.6  HGB 15.9  HCT 45.4  MCV 83.5  PLT 186     Coagulation Studies: No results for input(s): LABPROT, INR in the last 72 hours.  Imaging No new imaging overnight  ASSESSMENT AND PLAN: 65 year old male who has had staring spells for about 2weeks to 2 months ( unclear exact duration but wife thinks its most likely 2 weeks).  Initially presented on 03/02/2021 with hemianopsia for 3 days.  Initial MRI was negative, this was suspected to be due to migraine.  He then presented again on 03/04/2021 with continued hemianopsia.  Repeat MRI showed restricted diffusion in right occipital region but looks atypical for stroke.  LTM EEG was  obtained which showed seizures arising from right temporoparietal region.   New onset epilepsy -No clear etiology for new onset of seizures in this patient.  Lumbar puncture not suggestive of infection.  Differentials include autoimmune versus paraneoplastic causes.       Recommendations -Switch phenobarbital 195mg  to minimize daytime sedation -Continue, VPA 500mg  BID  and Vimpat 200 mg twice daily -Recommend follow-up with neurology in 2 to 4 weeks.  Will likely need follow-up MRI brain at that time. -CSF and serum autoimmune/paraneoplastic panel was sent to my clinic.  I will follow up on the results -Continue seizure precaution including do not drive  Seizure precautions: Per [facility] statutes, patients with seizures are not allowed to drive until they have been seizure-free for six months and cleared by a physician    Use caution when using heavy equipment or power tools. Avoid working on ladders or at heights. Take showers instead of baths. Ensure the water temperature is not too high on the home water heater. Do not go swimming alone. Do not lock yourself in a room alone (i.e. bathroom). When caring for infants or small children, sit down when holding, feeding, or changing them to minimize risk of injury to the child in the event you have a seizure. Maintain good sleep hygiene. Avoid alcohol.    If patient has another seizure, call 911  and bring them back to the ED if: A.  The seizure lasts longer than 5 minutes.      B.  The patient doesn't wake shortly after the seizure or has new problems such as difficulty seeing, speaking or moving following the seizure C.  The patient was injured during the seizure D.  The patient has a temperature over 102 F (39C) E.  The patient vomited during the seizure and now is having trouble breathing    During the Seizure   - First, ensure adequate ventilation and place patients on the floor on their left side  Loosen clothing around the  neck and ensure the airway is patent. If the patient is clenching the teeth, do not force the mouth open with any object as this can cause severe damage - Remove all items from the surrounding that can be hazardous. The patient may be oblivious to what's happening and may not even know what he or she is doing. If the patient is confused and wandering, either gently guide him/her away and block access to outside areas - Reassure the individual and be comforting - Call 911. In most cases, the seizure ends before EMS arrives. However, there are cases when seizures may last over 3 to 5 minutes. Or the individual may have developed breathing difficulties or severe injuries. If a pregnant patient or a person with diabetes develops a seizure, it is prudent to call an ambulance. - Finally, if the patient does not regain full consciousness, then call EMS. Most patients will remain confused for about 45 to 90 minutes after a seizure, so you must use judgment in calling for help. - Avoid restraints but make sure the patient is in a bed with padded side rails - Place the individual in a lateral position with the neck slightly flexed; this will help the saliva drain from the mouth and prevent the tongue from falling backward - Remove all nearby furniture and other hazards from the area - Provide verbal assurance as the individual is regaining consciousness - Provide the patient with privacy if possible - Call for help and start treatment as ordered by the caregiver    After the Seizure (Postictal Stage)   After a seizure, most patients experience confusion, fatigue, muscle pain and/or a headache. Thus, one should permit the individual to sleep. For the next few days, reassurance is essential. Being calm and helping reorient the person is also of importance.   Most seizures are painless and end spontaneously. Seizures are not harmful to others but can lead to complications such as stress on the lungs, brain and  the heart. Individuals with prior lung problems may develop labored breathing and respiratory distress.      I have spent a total of  26 minutes with the patient reviewing hospital notes,  test results, labs and examining the patient as well as establishing an assessment and plan that was discussed personally with the patient.  > 50% of time was spent in direct patient care.      Zeb Comfort Epilepsy [facility] For questions after 5pm please refer to AMION to reach the Neurologist on call

## 2021-03-13 NOTE — Discharge Summary (Signed)
Physician Discharge Summary  [name] KZS:[id number] DOB: 1955-11-21 DOA: 03/04/2021  PCP: [name], DO  Admit date: 03/04/2021 Discharge date: 03/13/2021  Admitted From: Home Disposition: Home  Recommendations for Outpatient Follow-up:  Follow up with PCP in 1-2 weeks Please obtain BMP/CBC in one week Please follow up with neurology in 2 to 4 weeks as scheduled  Home Health: PT OT Equipment/Devices: Walker  Discharge Condition: Stable CODE STATUS: Full Diet recommendation: As tolerated  Brief/Interim Summary: 65 y.o. male with medical history significant for type 2 diabetes, HLD, CKD stage IIIa, prostate cancer who presented to the ED for persistent headache and change in vision.  Initially presented on 11/24.  MRI brain was limited evaluation due to patient's intolerance.  Patient was discharged home.  Came back with persistent visual deficits.  Repeat MRI showed an acute stroke in the occipital lobe.  Patient was hospitalized for further management.  Patient seen by neurology.  His visual symptoms were concerning also for seizure activity.  EEG confirmed this.  Repeat MRI shows no significant stroke.  Acute metabolic encephalopathy, multifactorial, resolving:  Seizure, new onset, questionably provoked, POA Right occipital stroke ruled out, POA Repeat MRI now showing no significant stroke - neuro following Likely new onset seizure, unclear provoking factor given no recent trauma/infection/similar  LP 03/08/21 -unremarkable labs to date Currently on Phenobarbital, vimpat, depakote (previous phenytoin discontinued due to transient rash)  Continue statin/aspirin   Seizures, resolving as above Neuro following as above EEG continues to show ongoing epileptogenicity with ictal/interictal rhythmic discharges per neurology -outpatient follow-up in 2 to 4 weeks in [place] as scheduled  Abnormal liver lesion Discussed case w/ Dr Kale(Oncology) - repeat PSA less than  0.1 Patient will need MRI of the liver per GI recommendations to rule out hemangioma prior to biopsy, follow with PCP   Chronic R ureter obstruction -Discussed with Urology - given likely chronic, asymptomatic, and non-infected stone can follow up outpatient with established urology for ongoing follow-up.   Anxiety - Xanax as needed given above - low dose and sparingly given age/mental status changes from baseline.  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   Continue home medications including metformin and Jardiance prior to admission -wife indicates he was moderately noncompliant with these medications previously Family requesting to be discharged on previous home regiment with improved diet which we discussed would be difficult given his elevated A1c but will hold off on insulin administration if he can adjust his diet appropriately and see his PCP within 5 days of discharge for further discussion and medication management  Chronic kidney disease stage IIIa/metabolic acidosis, stable Labs remained stable  Essential hypertension/hyponatremia Holding ACE inhibitor.  Allowing some degree of permissive hypertension.  Patient is reasonably well controlled. Sodium level is stable.  Hyperlipidemia Continue statin.  Discharge Instructions  Discharge Instructions     Call MD for:   Complete by: As directed    Seizure like activity/confusion/mental status changes   Diet - low sodium heart healthy   Complete by: As directed    Diet Carb Modified   Complete by: As directed    Increase activity slowly   Complete by: As directed    No wound care   Complete by: As directed       Allergies as of 03/13/2021       Reactions   Actos [pioglitazone] Shortness Of Breath, Swelling   Abdomen swelling   Azithromycin Anaphylaxis, Hives  Ciprofloxacin Hives, Anaphylaxis   Codeine Nausea And Vomiting   Other reaction(s):  Agitation        Medication List     STOP taking these medications    ibuprofen 200 MG tablet Commonly known as: ADVIL   ramipril 5 MG capsule Commonly known as: ALTACE       TAKE these medications    aspirin 81 MG chewable tablet Chew 1 tablet (81 mg total) by mouth daily. Start taking on: March 14, 2021   atorvastatin 10 MG tablet Commonly known as: LIPITOR Take 10 mg by mouth at bedtime.   CLEAR EYES OP Place 1 drop into both eyes at bedtime as needed (dry eyes).   divalproex 500 MG DR tablet Commonly known as: DEPAKOTE Take 1 tablet (500 mg total) by mouth 2 (two) times daily.   empagliflozin 25 MG Tabs tablet Commonly known as: JARDIANCE Take 25 mg by mouth every morning.   Fluorouracil 4 % Crea Apply 1 application topically 2 (two) times daily.   lacosamide 200 MG Tabs tablet Commonly known as: VIMPAT Take 1 tablet (200 mg total) by mouth 2 (two) times daily.   metFORMIN 500 MG tablet Commonly known as: GLUCOPHAGE Take 1 tablet (500 mg total) by mouth 2 (two) times daily with a meal.   nitroGLYCERIN 0.3 MG SL tablet Commonly known as: NITROSTAT Place 0.3 mg under the tongue every 5 (five) minutes as needed for chest pain.   OVER THE COUNTER MEDICATION Take 1 tablet by mouth once. Over the counter sinus medication   pantoprazole 40 MG tablet Commonly known as: PROTONIX Take 40 mg by mouth every morning.   PHENobarbital 97.2 MG tablet Commonly known as: LUMINAL Take 2 tablets (194.4 mg total) by mouth at bedtime. Start taking on: March 14, 2021   vitamin B-12 500 MCG tablet Commonly known as: CYANOCOBALAMIN Take 500 mcg by mouth daily.               Durable Medical Equipment  (From admission, onward)           Start     Ordered   03/12/21 1057  For home use only DME 3 n 1  Once        03/12/21 1056   03/12/21 1057  For home use only DME Walker rolling  Once       Question Answer Comment  Walker: With 5 Inch Wheels    Patient needs a walker to treat with the following condition Weakness      03/12/21 1056            Follow-up Information     [facility] Follow up.   Specialty: Home Health Services Why: The home health agency will contact you for the first home visit. Contact information: [address] [place] [address] [phone]                Allergies  Allergen Reactions   Actos [Pioglitazone] Shortness Of Breath and Swelling    Abdomen swelling   Azithromycin Anaphylaxis and Hives   Ciprofloxacin Hives and Anaphylaxis   Codeine Nausea And Vomiting    Other reaction(s): Agitation    Consultations: Neurology   Procedures/Studies: CT ANGIO HEAD NECK W WO CM  Result Date: 03/03/2021 CLINICAL DATA:  Headache, stroke suspected EXAM: CT ANGIOGRAPHY HEAD AND NECK CT VENOGRAM HEAD TECHNIQUE: Multidetector CT imaging of the head and neck was performed using the standard protocol during bolus administration of intravenous  contrast. Multiplanar CT image reconstructions and MIPs were obtained to evaluate the vascular anatomy. Carotid stenosis measurements (when applicable) are obtained utilizing NASCET criteria, using the distal internal carotid diameter as the denominator. Venographic phase images of the brain were obtained following the administration of intravenous contrast. Multiplanar reformats and maximum intensity projections were generated. CONTRAST:  62mL OMNIPAQUE IOHEXOL 350 MG/ML SOLN COMPARISON:  03/02/2021 CT head, 03/02/2021 MRI head. FINDINGS: CT HEAD FINDINGS For noncontrast findings, please see same day CT head. CTA NECK FINDINGS Aortic arch: Standard branching. Imaged portion shows no evidence of aneurysm or dissection. No significant stenosis of the major arch vessel origins. Right carotid system: No evidence of dissection, stenosis (50% or greater) or occlusion. Left carotid system: No evidence of dissection, stenosis (50% or greater) or  occlusion. Vertebral arteries: No evidence of dissection, stenosis (50% or greater) or occlusion. Skeleton: No acute osseous abnormality. Other neck: Atrophy of the right parotid and submandibular glands. Otherwise negative. Upper chest: No focal pulmonary opacity or pleural effusion. Review of the MIP images confirms the above findings CTA HEAD FINDINGS Anterior circulation: Both internal carotid arteries are patent to the termini, without stenosis or other abnormality. A1 segments patent. Normal anterior communicating artery. Azygous ACA. Anterior cerebral arteries are patent to their distal aspects. No M1 stenosis or occlusion. Normal MCA bifurcations. Distal MCA branches perfused and symmetric. Posterior circulation: Vertebral arteries patent to the vertebrobasilar junction without stenosis. Basilar patent to its distal aspect. Superior cerebellar arteries patent bilaterally. Fetal origin of the right PCA. PCAs perfused to their distal aspects without stenosis. Visualized right posterior communicating artery. The left posterior communicating artery is not visualized. Anatomic variants: None significant. Review of the MIP images confirms the above findings. CTV FINDINGS No venous sinus stenosis or thrombosis. IMPRESSION: 1. No intracranial large vessel occlusion or significant stenosis. 2. No hemodynamically significant stenosis in the neck. 3. No venous sinus stenosis or thrombosis. Electronically Signed   By: Merilyn Baba M.D.   On: 03/03/2021 02:10   DG Abd 1 View  Result Date: 03/13/2021 CLINICAL DATA:  Abdominal pain. EXAM: ABDOMEN - 1 VIEW COMPARISON:  Recent chest, abdomen and pelvis CT with contrast 03/09/2021. FINDINGS: Nonobstructive overall bowel pattern with moderate stool retention again seen. Contrast is present in the transverse and left-sided colon and the rectum. Oval left pelvic 1.5 x 1 cm distal ureteral stone is again noted as well as a few phleboliths. No new calcification is evident.  There are right upper quadrant surgical clips. Visceral shadows are stable. There is no supine evidence of free air. IMPRESSION: 1. Constipation. 2. 1.5 x 1.0 cm oval distal left ureteral stone. 3. No acute radiographic findings. Electronically Signed   By: Telford Nab M.D.   On: 03/13/2021 05:59   CT Head Wo Contrast  Result Date: 03/02/2021 CLINICAL DATA:  Headache EXAM: CT HEAD WITHOUT CONTRAST TECHNIQUE: Contiguous axial images were obtained from the base of the skull through the vertex without intravenous contrast. COMPARISON:  None. FINDINGS: Brain: No evidence of acute infarction, hemorrhage, hydrocephalus, extra-axial collection or mass lesion/mass effect. Vascular: No hyperdense vessel or unexpected calcification. Skull: Normal. Negative for fracture or focal lesion. Sinuses/Orbits: No acute finding. Other: None. IMPRESSION: No acute intracranial findings. Electronically Signed   By: Davina Poke D.O.   On: 03/02/2021 16:40   MR BRAIN WO CONTRAST  Result Date: 03/07/2021 CLINICAL DATA:  Chronic seizures. History of trauma. EEG with evidence of seizures arising from the right temporoparietal region. EXAM: MRI HEAD  WITHOUT CONTRAST TECHNIQUE: Multiplanar, multiecho pulse sequences of the brain and surrounding structures were obtained without intravenous contrast. COMPARISON:  Head MRI 03/04/2021 FINDINGS: Despite being premedicated, the patient could not tolerate the complete examination. A complete noncontrast seizure protocol MRI was obtained with the exception of thin section coronal FLAIR imaging. IV contrast was not administered. Multiple sequences are mildly to moderately motion degraded. Brain: There is persistent cortically based trace diffusion weighted signal hyperintensity in the medial right occipital lobe, and there is now also asymmetric diffusion weighted signal and subtle T2 hyperintensity in the mesial right temporal lobe, particularly the hippocampus. More subtle cortical  diffusion abnormality is questioned elsewhere in the right temporal, occipital, and parietal lobes although this may be artifactual due to motion. There is also subcortical white matter T2 hypointensity in the medial right occipital and right temporal lobes. The ventricles and sulci are normal. No intracranial hemorrhage, mass, midline shift, or extra-axial fluid collection is identified. A few small T2 hyperintensities in the cerebral white matter bilaterally elsewhere are unchanged and nonspecific but compatible with minimal chronic small vessel ischemic disease. A dilated perivascular space or a punctate chronic infarct in the midline of the pons is unchanged. Vascular: Major intracranial vascular flow voids are preserved. Skull and upper cervical spine: Unremarkable bone marrow signal. Sinuses/Orbits: Unremarkable orbits. Clear paranasal sinuses. Trace bilateral mastoid effusions. Other: None. IMPRESSION: 1. Motion degraded, incomplete examination. 2. Abnormal cortically based diffusion signal in the occipital and temporal lobes, greater in extent than on the recent prior MRI and favored to reflect the sequelae of recent seizure activity although a component of acute ischemia is also possible. Subcortical T2 hypointensity in these regions is of indeterminate etiology but has been reported with nonketotic hyperglycemia and occipital seizures as well as ischemia and encephalitis. Electronically Signed   By: Logan Bores M.D.   On: 03/07/2021 15:46   MR BRAIN WO CONTRAST  Result Date: 03/04/2021 CLINICAL DATA:  Neuro deficit, acute, stroke suspected. EXAM: MRI HEAD WITHOUT CONTRAST TECHNIQUE: Multiplanar, multiecho pulse sequences of the brain and surrounding structures were obtained without intravenous contrast. COMPARISON:  Noncontrast head CT 03/02/2021. Brain MRI 03/02/2021. CT angiogram head/neck 03/03/2021. CT venogram head 03/03/2021. FINDINGS: Brain: Intermittently motion degraded examination, limiting  evaluation. Most notably, there is moderate/severe motion degradation of the sagittal T1 weighted sequence, moderate motion degradation of the axial T2 FLAIR sequence, moderate/severe motion degradation of the axial SWI sequence and severe motion degradation of the coronal T2 TSE sequence. Cerebral volume is normal for age. New from the prior brain MRI of 03/02/2021, there is subtle cortically-based restricted diffusion within the medial right occipital lobe spanning approximately 2.8 cm (for instance as seen on series 4, image 10). Findings are compatible with acute infarction. Minimal multifocal T2 FLAIR hyperintense signal abnormality within the cerebral white matter, nonspecific but compatible with chronic small vessel ischemic disease. 3 mm round T2 hyperintense focus within the midline pons, which may reflect a prominent perivascular space or chronic lacunar infarct (series 6, image 6). No evidence of an intracranial mass. No appreciable intracranial hemorrhage. No extra-axial fluid collection. No midline shift. Vascular: Maintained flow voids within the proximal large arterial vessels. Skull and upper cervical spine: No focal suspicious marrow lesion is identified. Sinuses/Orbits: Visualized orbits show no acute finding. No significant paranasal sinus disease. Other: Bilateral mastoid effusions. IMPRESSION: Motion degraded examination, as described. New from the prior brain MRI of 03/02/2021, there is subtle restricted diffusion within the medial right occipital lobe, spanning approximately 2.8  cm, compatible with acute infarction (right PCA vascular territory). Minimal chronic small-vessel ischemic changes within the cerebral white matter. 3 mm prominent perivascular space versus chronic lacunar infarct within the midline pons. Bilateral mastoid effusions. Electronically Signed   By: Kellie Simmering D.O.   On: 03/04/2021 17:14   MR BRAIN WO CONTRAST  Result Date: 03/02/2021 CLINICAL DATA:  Intermittent  headaches with visual changes EXAM: MRI HEAD WITHOUT CONTRAST TECHNIQUE: Multiplanar, multiecho pulse sequences of the brain and surrounding structures were obtained without intravenous contrast. COMPARISON:  None. FINDINGS: Evaluation is limited as patient could not tolerate more than the axial and coronal diffusion-weighted sequences. The sequences demonstrate no restricted diffusion to suggest acute or subacute infarct. IMPRESSION: Limited evaluation as patient could not tolerate the exam. Within this limitation, no acute or subacute infarct. Electronically Signed   By: Merilyn Baba M.D.   On: 03/02/2021 23:34   CT CHEST ABDOMEN PELVIS W CONTRAST  Result Date: 03/09/2021 CLINICAL DATA:  Cancer of unknown primary EXAM: CT CHEST, ABDOMEN, AND PELVIS WITH CONTRAST TECHNIQUE: Multidetector CT imaging of the chest, abdomen and pelvis was performed following the standard protocol during bolus administration of intravenous contrast. CONTRAST:  127mL OMNIPAQUE IOHEXOL 300 MG/ML  SOLN COMPARISON:  None. FINDINGS: CT CHEST FINDINGS Cardiovascular: Heart size is normal. No pericardial effusion identified. Main pulmonary artery is normal caliber. Thoracic aorta is normal in caliber with mild atherosclerotic plaques. Mediastinum/Nodes: No bulky axillary, mediastinal or hilar lymphadenopathy identified. Lungs/Pleura: No focal consolidation identified in the lungs. Mild breathing motion and subsegmental atelectatic changes. No pleural effusion or pneumothorax. Musculoskeletal: No suspicious bony lesions identified in the chest. CT ABDOMEN PELVIS FINDINGS Hepatobiliary: Liver is normal in size and contour. There is a 1.9 cm peripherally enhancing lesion in the right hepatic lobe near the dome and a 1.4 cm faint ill-defined hypodensity in segment 4B. Gallbladder is surgically absent. No biliary ductal dilatation. Pancreas: Unremarkable. No pancreatic ductal dilatation or surrounding inflammatory changes. Spleen: Normal in  size without focal abnormality. Adrenals/Urinary Tract: Adrenal glands appear normal. Normal perfusion and excretion of the right kidney and diminished perfusion and delayed excretion of the left kidney. Severe left hydroureteronephrosis secondary to a large obstructing calculus at the UVJ measuring 15 x 10 mm. Note is also made of a 4 mm calculus just proximally within the distal ureter. Associated left renal cortical thinning without significant perinephric fat stranding. A few renal cortical cysts identified measuring up to 3.2 cm on the right. Urinary bladder appears otherwise within normal limits. Stomach/Bowel: No bowel obstruction, free air or pneumatosis. No bowel wall edema or thickening identified. Moderate amount of retained fecal material throughout the colon. Appendix is normal Vascular/Lymphatic: No significant vascular findings are present. No enlarged abdominal or pelvic lymph nodes. Reproductive: Brachytherapy seeds within the prostate gland. Other: Small umbilical hernia containing fat.  No ascites. Musculoskeletal: No suspicious bony lesions identified. Degenerative changes in the lower lumbar spine. IMPRESSION: 1. No suspicious mass or lymphadenopathy identified in the chest. 2. 1.9 cm peripherally enhancing right hepatic lobe lesion which is indeterminate, could represent hemangioma or metastasis given the history. Faint hepatic hypodensity in segment 4B could represent focal fatty infiltration. Consider follow-up hepatic protocol MRI. 3. Otherwise no suspicious mass or lymphadenopathy in the abdomen or pelvis. 4. Large likely chronically obstructing calculus in the distal left ureter at the UVJ. Severe left hydroureteronephrosis with associated renal cortical thinning and relatively diminished perfusion. Electronically Signed   By: Ofilia Neas M.D.   On: 03/09/2021  16:40   EEG adult  Result Date: 03/13/2021 Lora Havens, MD     03/13/2021 10:44 AM Patient Name: CARRSON LIGHTCAP MRN:  [id number] Epilepsy Attending: Lora Havens Referring Physician/Provider: Dr Kerney Elbe Date: 03/13/2021 Duration: 21.06 mins  Patient history: 65yo M with right PCA stroke and visual hallucinations. EEG to evaluate for seizure.  Level of alertness: Awake  AEDs during EEG study: VPA, LCM, Phenobarb  Technical aspects: This EEG study was done with scalp electrodes positioned according to the 10-20 International system of electrode placement. Electrical activity was acquired at a sampling rate of 500Hz  and reviewed with a high frequency filter of 70Hz  and a low frequency filter of 1Hz . EEG data were recorded continuously and digitally stored.  Description: No clear posterior dominant rhythm consists of 8 Hz activity of moderate voltage (25-35 uV) seen predominantly in posterior head regions, symmetric and reactive to eye opening and eye closing. EEG showed intermittent 3-5 Hz theta-delta slowing in right hemisphere, maximal right temporoparietal region which at times appears sharply contoured.   Hyperventilation and photic stimulation were not performed.    ABNORMALITY -Intermittent slow, right hemisphere, maximal right temporo-parietal region.  IMPRESSION: This study showed cortical dysfunction arising from right hemisphere, maximal right temporoparietal region, nonspecific in etiology but most likely secondary to underlying structural abnormality.  No definite seizures or epileptiform discharges were seen during the study. EEG appears to be significantly improved compared to previous studies. Lora Havens  EEG adult  Result Date: 03/05/2021 Lora Havens, MD     03/05/2021  3:34 PM Patient Name: OBBIE LEWALLEN MRN: [id number] Epilepsy Attending: Lora Havens Referring Physician/Provider: Dr Rosalin Hawking Date: 03/05/2021 Duration: 21.45 mins Patient history: 65yo M with right PCA stroke and visual hallucinations. EEG to evaluate for seizure. Level of alertness: Awake, asleep AEDs during EEG study:  LEV Technical aspects: This EEG study was done with scalp electrodes positioned according to the 10-20 International system of electrode placement. Electrical activity was acquired at a sampling rate of 500Hz  and reviewed with a high frequency filter of 70Hz  and a low frequency filter of 1Hz . EEG data were recorded continuously and digitally stored. Description: No clear posterior dominant rhythm consists of 8 Hz activity of moderate voltage (25-35 uV) seen predominantly in posterior head regions, symmetric and reactive to eye opening and eye closing. Sleep was characterized by vertex waves, sleep spindles (12 to 14 Hz), maximal frontocentral region. Three seizure without clinical signs were noted on 03/05/2021 at 1431, 1436 and 1447 arising from right temporo-parietal region. Average duration 30 seconds. During seizure, eeg showed 5-6Hz  theta slowing in right temporo-parietal region which then involved all of right frontotemporal region as well as left frontal region and evolved into 3-4hz  delta slowing. Hyperventilation and photic stimulation were not performed.   ABNORMALITY - Seizure without clinical signs, right temporo-parietal region. IMPRESSION: This study showed three seizure without clinical signs on 03/05/2021 at 1431, 1436 and 1447 arising from right temporo-parietal region. Average duration 30 seconds. Dr Erlinda Hong was notified. Priyanka [name]   Overnight EEG with video  Result Date: 03/11/2021 Lora Havens, MD     03/12/2021  9:39 AM Patient Name: ELESTER APODACA MRN: [id number] Epilepsy Attending: Lora Havens Referring Physician/Provider: Dr Rosalin Hawking Duration: 03/10/2021 1034 to 03/11/2021 1147  Patient history: 64yo M with right PCA stroke and visual hallucinations. EEG to evaluate for seizure.  Level of alertness: Awake, asleep  AEDs during EEG study: Phenobarb,Vimpat, Clonazepam, VPA  Technical aspects: This EEG study was done with scalp electrodes positioned according to the 10-20  International system of electrode placement. Electrical activity was acquired at a sampling rate of 500Hz  and reviewed with a high frequency filter of 70Hz  and a low frequency filter of 1Hz . EEG data were recorded continuously and digitally stored.  Description: No clear posterior dominant rhythm consists of 8 Hz activity of moderate voltage (25-35 uV) seen predominantly in posterior head regions, symmetric and reactive to eye opening and eye closing. Sleep was characterized by vertex waves, sleep spindles (12 to 14 Hz), maximal frontocentral region.   EEG also showed intermittent lateralized periodic discharges in right hemisphere, maximal right temporoparietal region at 1 Hz which at times appeared rhythmic consistent with brief ictal-interictal rhythmic discharges.  Hyperventilation and photic stimulation were not performed.  ABNORMALITY -Brief ictal-interictal rhythmic discharges, right hemisphere, maximal right temporoparietal region  IMPRESSION: This study showed evidence of cortical dysfunction and epileptogenicity arising from right hemisphere, maximal right temporoparietal region likely due to underlying structural abnormality.  Brief ictal-interictal rhythmic discharges were also noted in right hemisphere, maximal right temporoparietal region which are on the ictal-interictal continuum, with high potential for seizure recurrence. No definite seizures were seen during this study.  Priyanka [name]   Overnight EEG with video  Result Date: 03/06/2021 Lora Havens, MD     03/07/2021  8:56 AM Patient Name: CORWIN KUIKEN MRN: [id number] Epilepsy Attending: Lora Havens Referring Physician/Provider: Dr Rosalin Hawking Duration: 03/05/2021 1746 to11/28/2022 1746  Patient history: 65yo M with right PCA stroke and visual hallucinations. EEG to evaluate for seizure.  Level of alertness: Awake, asleep  AEDs during EEG study: PHT, Clonazepam  Technical aspects: This EEG study was done with scalp electrodes  positioned according to the 10-20 International system of electrode placement. Electrical activity was acquired at a sampling rate of 500Hz  and reviewed with a high frequency filter of 70Hz  and a low frequency filter of 1Hz . EEG data were recorded continuously and digitally stored.  Description: No clear posterior dominant rhythm consists of 8 Hz activity of moderate voltage (25-35 uV) seen predominantly in posterior head regions, symmetric and reactive to eye opening and eye closing. Sleep was characterized by vertex waves, sleep spindles (12 to 14 Hz), maximal frontocentral region.  22 seizure without clinical signs were noted during this study arising from right temporo-parietal region. Average duration 30 seconds. Last seizure was noted on 03/06/2021 at 1537. During seizure, eeg showed 5-6Hz  theta slowing in right temporo-parietal region which then involved all of right frontotemporal region as well as left frontal region and evolved into 3-4hz  delta slowing. EEG also showed lateralized periodic discharges in right hemisphere, maximal right temporoparietal region at 1 Hz which at times appeared rhythmic consistent with brief ictal-interictal rhythmic discharges  Hyperventilation and photic stimulation were not performed.    ABNORMALITY - Seizure without clinical signs, right temporo-parietal region. -Brief ictal-interictal rhythmic discharges, right hemisphere, maximal right temporoparietal region -Lateralized paretic discharges,right hemisphere, maximal right temporoparietal region  IMPRESSION: This study showed 22 seizure without clinical signs arising nfrom right temporo-parietal region. Average duration 30 seconds. Last seizure was noted on 03/06/2021 at 1537. Additionally, EEG showed evidence of cortical dysfunction and epileptogenicity arising from right hemisphere, maximal right temporoparietal region likely due to underlying structural abnormality with high potential for seizure recurrence.  Lora Havens   CT VENOGRAM HEAD  Result Date: 03/03/2021 CLINICAL DATA:  Headache, stroke suspected EXAM: CT ANGIOGRAPHY HEAD AND NECK CT  VENOGRAM HEAD TECHNIQUE: Multidetector CT imaging of the head and neck was performed using the standard protocol during bolus administration of intravenous contrast. Multiplanar CT image reconstructions and MIPs were obtained to evaluate the vascular anatomy. Carotid stenosis measurements (when applicable) are obtained utilizing NASCET criteria, using the distal internal carotid diameter as the denominator. Venographic phase images of the brain were obtained following the administration of intravenous contrast. Multiplanar reformats and maximum intensity projections were generated. CONTRAST:  64mL OMNIPAQUE IOHEXOL 350 MG/ML SOLN COMPARISON:  03/02/2021 CT head, 03/02/2021 MRI head. FINDINGS: CT HEAD FINDINGS For noncontrast findings, please see same day CT head. CTA NECK FINDINGS Aortic arch: Standard branching. Imaged portion shows no evidence of aneurysm or dissection. No significant stenosis of the major arch vessel origins. Right carotid system: No evidence of dissection, stenosis (50% or greater) or occlusion. Left carotid system: No evidence of dissection, stenosis (50% or greater) or occlusion. Vertebral arteries: No evidence of dissection, stenosis (50% or greater) or occlusion. Skeleton: No acute osseous abnormality. Other neck: Atrophy of the right parotid and submandibular glands. Otherwise negative. Upper chest: No focal pulmonary opacity or pleural effusion. Review of the MIP images confirms the above findings CTA HEAD FINDINGS Anterior circulation: Both internal carotid arteries are patent to the termini, without stenosis or other abnormality. A1 segments patent. Normal anterior communicating artery. Azygous ACA. Anterior cerebral arteries are patent to their distal aspects. No M1 stenosis or occlusion. Normal MCA bifurcations. Distal MCA branches perfused and  symmetric. Posterior circulation: Vertebral arteries patent to the vertebrobasilar junction without stenosis. Basilar patent to its distal aspect. Superior cerebellar arteries patent bilaterally. Fetal origin of the right PCA. PCAs perfused to their distal aspects without stenosis. Visualized right posterior communicating artery. The left posterior communicating artery is not visualized. Anatomic variants: None significant. Review of the MIP images confirms the above findings. CTV FINDINGS No venous sinus stenosis or thrombosis. IMPRESSION: 1. No intracranial large vessel occlusion or significant stenosis. 2. No hemodynamically significant stenosis in the neck. 3. No venous sinus stenosis or thrombosis. Electronically Signed   By: Merilyn Baba M.D.   On: 03/03/2021 02:10   ECHOCARDIOGRAM COMPLETE BUBBLE STUDY  Result Date: 03/05/2021    ECHOCARDIOGRAM REPORT   Patient Name:   TISON LEIBOLD Date of Exam: 03/05/2021 Medical Rec #:  [id number]     Height:       68.0 in Accession #:    [id number]    Weight:       190.0 lb Date of Birth:  [birth year]      BSA:          2.000 m Patient Age:    77 years      BP:           142/73 mmHg Patient Gender: M             HR:           79 bpm. Exam Location:  Inpatient Procedure: 2D Echo and Saline Contrast Bubble Study Indications:    Stroke I63.9  History:        Patient has no prior history of Echocardiogram examinations.                 Risk Factors:Hypertension, Dyslipidemia and Diabetes.  Sonographer:    Mikki Santee RDCS Referring Phys: [place]  1. Left ventricular ejection fraction, by estimation, is 70 to 75%. The left ventricle has hyperdynamic function. The left ventricle has no regional wall  motion abnormalities. There is mild left ventricular hypertrophy. Left ventricular diastolic parameters were normal.  2. Right ventricular systolic function is normal. The right ventricular size is normal.  3. The mitral valve is normal in  structure. Trivial mitral valve regurgitation. No evidence of mitral stenosis.  4. The aortic valve is tricuspid. Aortic valve regurgitation is mild. No aortic stenosis is present.  5. The inferior vena cava is normal in size with greater than 50% respiratory variability, suggesting right atrial pressure of 3 mmHg.  6. Agitated saline contrast bubble study was negative, with no evidence of any interatrial shunt. FINDINGS  Left Ventricle: Left ventricular ejection fraction, by estimation, is 70 to 75%. The left ventricle has hyperdynamic function. The left ventricle has no regional wall motion abnormalities. The left ventricular internal cavity size was small. There is mild left ventricular hypertrophy. Left ventricular diastolic parameters were normal. Right Ventricle: The right ventricular size is normal. No increase in right ventricular wall thickness. Right ventricular systolic function is normal. Left Atrium: Left atrial size was normal in size. Right Atrium: Right atrial size was normal in size. Pericardium: There is no evidence of pericardial effusion. Mitral Valve: The mitral valve is normal in structure. Trivial mitral valve regurgitation. No evidence of mitral valve stenosis. Tricuspid Valve: The tricuspid valve is normal in structure. Tricuspid valve regurgitation is trivial. Aortic Valve: The aortic valve is tricuspid. Aortic valve regurgitation is mild. Aortic regurgitation PHT measures 611 msec. No aortic stenosis is present. Pulmonic Valve: The pulmonic valve was not well visualized. Pulmonic valve regurgitation is not visualized. Aorta: The aortic root and ascending aorta are structurally normal, with no evidence of dilitation. Venous: The inferior vena cava is normal in size with greater than 50% respiratory variability, suggesting right atrial pressure of 3 mmHg. IAS/Shunts: The interatrial septum was not well visualized. Agitated saline contrast was given intravenously to evaluate for intracardiac  shunting. Agitated saline contrast bubble study was negative, with no evidence of any interatrial shunt.  LEFT VENTRICLE PLAX 2D LVIDd:         3.70 cm   Diastology LVIDs:         2.50 cm   LV e' medial:    8.05 cm/s LV PW:         1.30 cm   LV E/e' medial:  9.6 LV IVS:        1.30 cm   LV e' lateral:   16.10 cm/s LVOT diam:     1.90 cm   LV E/e' lateral: 4.8 LV SV:         73 LV SV Index:   37 LVOT Area:     2.84 cm  RIGHT VENTRICLE RV S prime:     18.60 cm/s TAPSE (M-mode): 2.0 cm LEFT ATRIUM             Index        RIGHT ATRIUM           Index LA diam:        2.80 cm 1.40 cm/m   RA Area:     12.80 cm LA Vol (A2C):   32.6 ml 16.30 ml/m  RA Volume:   26.70 ml  13.35 ml/m LA Vol (A4C):   31.1 ml 15.55 ml/m LA Biplane Vol: 34.1 ml 17.05 ml/m  AORTIC VALVE LVOT Vmax:   155.00 cm/s LVOT Vmean:  101.000 cm/s LVOT VTI:    0.258 m AI PHT:      611 msec  AORTA  Ao Root diam: 3.80 cm MITRAL VALVE MV Area (PHT): 3.74 cm    SHUNTS MV Decel Time: 203 msec    Systemic VTI:  0.26 m MV E velocity: 77.00 cm/s  Systemic Diam: 1.90 cm MV A velocity: 95.90 cm/s MV E/A ratio:  0.80 Oswaldo Milian MD Electronically signed by Oswaldo Milian MD Signature Date/Time: 03/05/2021/1:04:14 PM    Final    VAS [place] LOWER EXTREMITY VENOUS (DVT)  Result Date: 03/06/2021  Lower Venous DVT Study Patient Name:  ADDIS TUOHY  Date of Exam:   03/05/2021 Medical Rec #: [id number]      Accession #:    [id number] Date of Birth: 03/04/1956       Patient Gender: M Patient Age:   26 years Exam Location:  [facility] Procedure:      VAS [place] LOWER EXTREMITY VENOUS (DVT) Referring Phys: Cornelius Moras XU --------------------------------------------------------------------------------  Indications: Stroke.  Comparison Study: No prior study on file Performing Technologist: Sharion Dove RVS  Examination Guidelines: A complete evaluation includes B-mode imaging, spectral Doppler, color Doppler, and power Doppler as needed of all accessible  portions of each vessel. Bilateral testing is considered an integral part of a complete examination. Limited examinations for reoccurring indications may be performed as noted. The reflux portion of the exam is performed with the patient in reverse Trendelenburg.  +---------+---------------+---------+-----------+----------+--------------+ RIGHT    CompressibilityPhasicitySpontaneityPropertiesThrombus Aging +---------+---------------+---------+-----------+----------+--------------+ CFV      Full           Yes      Yes                                 +---------+---------------+---------+-----------+----------+--------------+ SFJ      Full                                                        +---------+---------------+---------+-----------+----------+--------------+ FV Prox  Full                                                        +---------+---------------+---------+-----------+----------+--------------+ FV Mid   Full                                                        +---------+---------------+---------+-----------+----------+--------------+ FV DistalFull                                                        +---------+---------------+---------+-----------+----------+--------------+ PFV      Full                                                        +---------+---------------+---------+-----------+----------+--------------+  POP      Full           Yes      Yes                                 +---------+---------------+---------+-----------+----------+--------------+ PTV      Full                                                        +---------+---------------+---------+-----------+----------+--------------+ PERO     Full                                                        +---------+---------------+---------+-----------+----------+--------------+   +---------+---------------+---------+-----------+----------+--------------+ LEFT      CompressibilityPhasicitySpontaneityPropertiesThrombus Aging +---------+---------------+---------+-----------+----------+--------------+ CFV      Full           Yes      Yes                                 +---------+---------------+---------+-----------+----------+--------------+ SFJ      Full                                                        +---------+---------------+---------+-----------+----------+--------------+ FV Prox  Full                                                        +---------+---------------+---------+-----------+----------+--------------+ FV Mid   Full                                                        +---------+---------------+---------+-----------+----------+--------------+ FV DistalFull                                                        +---------+---------------+---------+-----------+----------+--------------+ PFV      Full                                                        +---------+---------------+---------+-----------+----------+--------------+ POP      Full           Yes      Yes                                 +---------+---------------+---------+-----------+----------+--------------+  PTV      Full                                                        +---------+---------------+---------+-----------+----------+--------------+ PERO     Full                                                        +---------+---------------+---------+-----------+----------+--------------+     Summary: BILATERAL: - No evidence of deep vein thrombosis seen in the lower extremities, bilaterally. -No evidence of popliteal cyst, bilaterally.   *See table(s) above for measurements and observations. Electronically signed by Orlie Pollen on 03/06/2021 at 10:22:58 AM.    Final      Subjective: No acute issues or events overnight   Discharge Exam: Vitals:   03/13/21 0400 03/13/21 0926  BP: (!) 142/93 130/86  Pulse:   (!) 123  Resp: 18 17  Temp: 98.2 F (36.8 C) 98.2 F (36.8 C)  SpO2: 100% 97%   Vitals:   03/12/21 1955 03/12/21 2344 03/13/21 0400 03/13/21 0926  BP: 134/88 (!) 148/82 (!) 142/93 130/86  Pulse:    (!) 123  Resp: 18 20 18 17   Temp: 98.9 F (37.2 C) 98.1 F (36.7 C) 98.2 F (36.8 C) 98.2 F (36.8 C)  TempSrc: Oral Oral Oral Oral  SpO2: 95% 99% 100% 97%  Weight:      Height:        General: Pt is alert, awake, not in acute distress Cardiovascular: RRR, S1/S2 +, no rubs, no gallops Respiratory: CTA bilaterally, no wheezing, no rhonchi Abdominal: Soft, NT, ND, bowel sounds + Extremities: no edema, no cyanosis    The results of significant diagnostics from this hospitalization (including imaging, microbiology, ancillary and laboratory) are listed below for reference.     Microbiology: Recent Results (from the past 240 hour(s))  CSF culture w Gram Stain     Status: None   Collection Time: 03/08/21  4:00 PM   Specimen: CSF; Cerebrospinal Fluid  Result Value Ref Range Status   Specimen Description CSF  Final   Special Requests NONE  Final   Gram Stain   Final    WBC PRESENT, PREDOMINANTLY MONONUCLEAR NO ORGANISMS SEEN CYTOSPIN SMEAR    Culture   Final    NO GROWTH 3 DAYS Performed at [facility], [address] [place], [place] [address]    Report Status 03/12/2021 FINAL  Final     Labs: BNP (last 3 results) No results for input(s): BNP in the last 8760 hours. Basic Metabolic Panel: Recent Labs  Lab 03/08/21 0359 03/09/21 0054 03/10/21 0310 03/11/21 0433 03/12/21 0243  NA 130* 131* 132* 134* 131*  K 4.0 3.9 4.0 4.2 4.1  CL 102 103 103 102 96*  CO2 20* 18* 19* 20* 22  GLUCOSE 193* 218* 163* 153* 222*  BUN 16 14 14 18 22   CREATININE 1.49* 1.40* 1.45* 1.65* 1.82*  CALCIUM 9.1 9.1 9.4 9.8 10.3  MG 1.8  --   --   --   --    Liver Function Tests: Recent Labs  Lab 03/07/21 0102  AST 15  ALT 17  ALKPHOS 83  BILITOT 0.9  PROT 6.1*  ALBUMIN  3.7   No results for input(s): LIPASE, AMYLASE in the last 168 hours. No results for input(s): AMMONIA in the last 168 hours. CBC: Recent Labs  Lab 03/08/21 0359  WBC 4.6  HGB 15.9  HCT 45.4  MCV 83.5  PLT 186   Cardiac Enzymes: No results for input(s): CKTOTAL, CKMB, CKMBINDEX, TROPONINI in the last 168 hours. BNP: Invalid input(s): POCBNP CBG: Recent Labs  Lab 03/12/21 0609 03/12/21 1229 03/12/21 1733 03/12/21 2137 03/13/21 0619  GLUCAP 180* 165* 266* 287* 262*   D-Dimer No results for input(s): DDIMER in the last 72 hours. Hgb A1c No results for input(s): HGBA1C in the last 72 hours. Lipid Profile No results for input(s): CHOL, HDL, LDLCALC, TRIG, CHOLHDL, LDLDIRECT in the last 72 hours. Thyroid function studies No results for input(s): TSH, T4TOTAL, T3FREE, THYROIDAB in the last 72 hours.  Invalid input(s): FREET3 Anemia work up No results for input(s): VITAMINB12, FOLATE, FERRITIN, TIBC, IRON, RETICCTPCT in the last 72 hours. Urinalysis    Component Value Date/Time   COLORURINE STRAW (A) 01/25/2018 0034   APPEARANCEUR CLEAR 01/25/2018 0034   LABSPEC 1.027 01/25/2018 0034   PHURINE 5.0 01/25/2018 0034   GLUCOSEU >=500 (A) 01/25/2018 0034   HGBUR NEGATIVE 01/25/2018 0034   BILIRUBINUR NEGATIVE 01/25/2018 0034   KETONESUR 20 (A) 01/25/2018 0034   PROTEINUR NEGATIVE 01/25/2018 0034   UROBILINOGEN 0.2 04/10/2007 2318   NITRITE NEGATIVE 01/25/2018 0034   LEUKOCYTESUR NEGATIVE 01/25/2018 0034   Sepsis Labs Invalid input(s): PROCALCITONIN,  WBC,  LACTICIDVEN Microbiology Recent Results (from the past 240 hour(s))  CSF culture w Gram Stain     Status: None   Collection Time: 03/08/21  4:00 PM   Specimen: CSF; Cerebrospinal Fluid  Result Value Ref Range Status   Specimen Description CSF  Final   Special Requests NONE  Final   Gram Stain   Final    WBC PRESENT, PREDOMINANTLY MONONUCLEAR NO ORGANISMS SEEN CYTOSPIN SMEAR    Culture   Final    NO GROWTH  3 DAYS Performed at [facility], [address], [place], [place] [address]    Report Status 03/12/2021 FINAL  Final     Time coordinating discharge: Over 30 minutes  SIGNED:   Little Ishikawa, DO [facility] 03/13/2021, 11:15 AM Pager   If 7PM-7AM, please contact night-coverage [email or web address]

## 2021-03-13 NOTE — Progress Notes (Signed)
PT Cancellation Note  Patient Details Name: [name] MRN: [id number] DOB: [birth year]   Cancelled Treatment:    Reason Eval/Treat Not Completed: (P) Other (comment) (EEG tech in room working with pt in supine.) Will continue efforts in afternoon per PT plan of care as schedule permits.   [name] [name] 03/13/2021, 10:35 AM

## 2021-03-14 LAB — OLIGOCLONAL BANDS, CSF + SERM

## 2021-03-14 NOTE — Progress Notes (Signed)
CM was able to get tier exception for the lacosamide. It was reduced to a tier 2 for the rest of the year. CM has updated the patients spouse. She will f/u with neurologist as outpatient to continue tier exception for next year.

## 2021-03-20 LAB — MISC LABCORP TEST (SEND OUT): Labcorp test code: 9985

## 2021-03-22 LAB — MISC LABCORP TEST (SEND OUT): Labcorp test code: 9985

## 2021-03-27 ENCOUNTER — Encounter: Payer: Self-pay | Admitting: *Deleted

## 2021-03-29 ENCOUNTER — Telehealth: Payer: Self-pay | Admitting: Diagnostic Neuroimaging

## 2021-03-29 ENCOUNTER — Encounter: Payer: Self-pay | Admitting: Diagnostic Neuroimaging

## 2021-03-29 ENCOUNTER — Ambulatory Visit (INDEPENDENT_AMBULATORY_CARE_PROVIDER_SITE_OTHER): Payer: Medicare Other | Admitting: Diagnostic Neuroimaging

## 2021-03-29 VITALS — BP 133/89 | HR 75 | Ht 68.0 in | Wt 181.0 lb

## 2021-03-29 DIAGNOSIS — I639 Cerebral infarction, unspecified: Secondary | ICD-10-CM

## 2021-03-29 DIAGNOSIS — Z794 Long term (current) use of insulin: Secondary | ICD-10-CM | POA: Diagnosis not present

## 2021-03-29 DIAGNOSIS — E1149 Type 2 diabetes mellitus with other diabetic neurological complication: Secondary | ICD-10-CM | POA: Diagnosis not present

## 2021-03-29 DIAGNOSIS — G40201 Localization-related (focal) (partial) symptomatic epilepsy and epileptic syndromes with complex partial seizures, not intractable, with status epilepticus: Secondary | ICD-10-CM | POA: Diagnosis not present

## 2021-03-29 MED ORDER — LACOSAMIDE 200 MG PO TABS: 200.0000 mg | ORAL_TABLET | Freq: Two times a day (BID) | ORAL | 5 refills | Status: DC

## 2021-03-29 MED ORDER — PHENOBARBITAL 97.2 MG PO TABS: 97.2000 mg | ORAL_TABLET | Freq: Every day | ORAL | 4 refills | Status: DC

## 2021-03-29 MED ORDER — DIVALPROEX SODIUM 500 MG PO DR TAB: 500.0000 mg | DELAYED_RELEASE_TABLET | Freq: Two times a day (BID) | ORAL | 4 refills | Status: DC

## 2021-03-29 MED ORDER — ALPRAZOLAM 0.5 MG PO TABS: ORAL_TABLET | ORAL | 0 refills | Status: AC

## 2021-03-29 NOTE — Patient Instructions (Signed)
-   continue divalproex 500mg  twice a day   - continue vimpat 200mg  twice a day   - reduce phenobarbital 97.2mg  at bedtime (due to oversedation)

## 2021-03-29 NOTE — Telephone Encounter (Signed)
Medicare/ [employer] supp order sent to GI, they will obtain the auth and reach out to the patient to schedule.

## 2021-03-29 NOTE — Progress Notes (Signed)
[facility]  PATIENT: [name] DOB: October 11, 1955  REFERRING CLINICIAN: [name], DO HISTORY FROM: patient  REASON FOR VISIT: new consult    HISTORICAL  CHIEF COMPLAINT:  Chief Complaint  Patient presents with   Seizures    Rm 6 New Pt, hospital FU  wife- Almyra Free, son- Heath Lark "seizure medicines make him off balance, groggy, high all the time and we're not sure he even had seizures- had 2-3 weeks of blood sugars 300-400's"    Occipital infarction    HISTORY OF PRESENT ILLNESS:   65 year old male with diabetes, hypertension, hyperlipidemia, chronic kidney disease, here for evaluation of seizures.  Patient diagnosed with diabetes in 2019.  He has had difficulty controlled diabetes with hemoglobin A1c's ranging from 9-13.  Patient went to emergency room on 01/25/2018 with blood sugars greater than 600.  Patient presented to hospital on 03/02/2021 due to headaches and vision changes.  Patient had significant hyperglycemia on presentation as well as the several weeks leading up to this.  Initially he was evaluated for stroke and second MRI raise possibility of right occipital ischemic infarction.  Over time he had continued visual hallucinations, emotional outburst attacks, which were ultimately diagnosed as epileptic seizures on long-term EEG monitoring.  Antiseizure medications were started.  Eventually escalation of antiseizure medications was able to stop these events using Vimpat, Depakote and phenobarbital.  He was discharged on 03/13/2021.  No further seizure-like events.  However patient has had significant ataxia, fatigue, sedation issues which family is concerned about overmedication.    REVIEW OF SYSTEMS: Full 14 system review of systems performed and negative with exception of: as per HPI.  ALLERGIES: Allergies  Allergen Reactions   Actos [Pioglitazone] Shortness Of Breath and Swelling    Abdomen swelling   Azithromycin Anaphylaxis and Hives    Ciprofloxacin Hives and Anaphylaxis   Codeine Nausea And Vomiting    Other reaction(s): Agitation    HOME MEDICATIONS: Outpatient Medications Prior to Visit  Medication Sig Dispense Refill   aspirin 81 MG chewable tablet Chew 1 tablet (81 mg total) by mouth daily. 30 tablet 0   atorvastatin (LIPITOR) 10 MG tablet Take 10 mg by mouth at bedtime.     empagliflozin (JARDIANCE) 25 MG TABS tablet Take 25 mg by mouth every morning.     pantoprazole (PROTONIX) 40 MG tablet Take 40 mg by mouth every morning.     tirzepatide [facility]) 2.5 MG/0.5ML Pen Inject 2.5 mg into the skin once a week.     vitamin B-12 (CYANOCOBALAMIN) 500 MCG tablet Take 500 mcg by mouth daily.     divalproex (DEPAKOTE) 500 MG DR tablet Take 1 tablet (500 mg total) by mouth 2 (two) times daily. 60 tablet 3   lacosamide (VIMPAT) 200 MG TABS tablet Take 1 tablet (200 mg total) by mouth 2 (two) times daily. 60 tablet 3   PHENobarbital (LUMINAL) 97.2 MG tablet Take 2 tablets (194.4 mg total) by mouth at bedtime. 60 tablet 3   Fluorouracil 4 % CREA Apply 1 application topically 2 (two) times daily. (Patient not taking: Reported on 03/04/2021)     metFORMIN (GLUCOPHAGE) 500 MG tablet Take 1 tablet (500 mg total) by mouth 2 (two) times daily with a meal. (Patient not taking: Reported on 03/29/2021) 60 tablet 0   Naphazoline HCl (CLEAR EYES OP) Place 1 drop into both eyes at bedtime as needed (dry eyes). (Patient not taking: Reported on 03/29/2021)     nitroGLYCERIN (NITROSTAT) 0.3 MG SL  tablet Place 0.3 mg under the tongue every 5 (five) minutes as needed for chest pain. (Patient not taking: Reported on 03/29/2021)     OVER THE COUNTER MEDICATION Take 1 tablet by mouth once. Over the counter sinus medication (Patient not taking: Reported on 03/29/2021)     aspirin 81 MG chewable tablet Chew 1 tablet (81 mg total) by mouth daily. 30 tablet 1   No facility-administered medications prior to visit.    PAST MEDICAL HISTORY: Past  Medical History:  Diagnosis Date   CKD (chronic kidney disease), stage III (HCC)    GERD (gastroesophageal reflux disease)    Hiatal hernia    History of esophageal dilatation    05/ 2004  incompleted ring   Hyperlipidemia associated with type 2 diabetes mellitus ([place])    Hypertension associated with diabetes ([place])    Mohs defect of cheek 2011   rt side/ eye to mandible   Occipital infarction [facility])    Prostate cancer [facility]) urologist-  dr dahlstedt/  oncologist-  dr Tammi Klippel   Stage T1c, Gleason 7, PSA 5.9, vol 27cc   Seizures ([place])    Skin cancer of face 2011   returned 2013-radiation tx    Type 2 diabetes mellitus ([place])     PAST SURGICAL HISTORY: Past Surgical History:  Procedure Laterality Date   COLONOSCOPY     CYSTOSCOPY N/A 06/29/2016   Procedure: CYSTOSCOPY FLEXIBLE;  Surgeon: Franchot Gallo, MD;  Location: [facility];  Service: Urology;  Laterality: N/A;  no seeds found in bladder   ESOPHAGOGASTRODUODENOSCOPY (EGD) WITH ESOPHAGEAL DILATION  08/2002   HEMORRHOID SURGERY     INCISIONAL HERNIA REPAIR N/A 12/07/2015   Procedure: LAPAROSCOPIC INCISIONAL HERNIA WITH MESH ;  Surgeon: Fanny Skates, MD;  Location: [place];  Service: General;  Laterality: N/A;   LAPAROSCOPIC CHOLECYSTECTOMY  2010   PROSTATE BIOPSY     RADIOACTIVE SEED IMPLANT N/A 06/29/2016   Procedure: RADIOACTIVE SEED IMPLANT/BRACHYTHERAPY IMPLANT;  Surgeon: Franchot Gallo, MD;  Location: [facility];  Service: Urology;  Laterality: N/A;  102 seeds implanted    SKIN GRAFT  2013,2011   from neck to face on right side/ skin cancer and tumor    FAMILY HISTORY: Family History  Problem Relation Age of Onset   Cancer Mother        colon   Stroke Brother     SOCIAL HISTORY: Social History   Socioeconomic History   Marital status: Married    Spouse name: Almyra Free   Number of children: 3   Years of education: Not on file   Highest education level: GED or equivalent   Occupational History   Not on file  Tobacco Use   Smoking status: Former    Packs/day: 0.50    Years: 10.00    Pack years: 5.00    Types: Cigarettes    Quit date: 04/09/1978    Years since quitting: 43.0   Smokeless tobacco: Never   Tobacco comments:    QUIT SMOKING IN THE 1980'S  Substance and Sexual Activity   Alcohol use: No   Drug use: No   Sexual activity: Yes  Other Topics Concern   Not on file  Social History Narrative   Lives with wife   Social Determinants of Health   Financial Resource Strain: Not on file  Food Insecurity: Not on file  Transportation Needs: Not on file  Physical Activity: Not on file  Stress: Not on file  Social Connections: Not on  file  Intimate Partner Violence: Not on file     PHYSICAL EXAM  GENERAL EXAM/CONSTITUTIONAL: Vitals:  Vitals:   03/29/21 0819  BP: 133/89  Pulse: 75  Weight: 181 lb (82.1 kg)  Height: 5\' 8"  (1.727 m)   Body mass index is 27.52 kg/m. Wt Readings from Last 3 Encounters:  03/29/21 181 lb (82.1 kg)  03/04/21 190 lb (86.2 kg)  03/02/21 190 lb (86.2 kg)   Patient is in no distress; well developed, nourished and groomed; neck is supple SLOW RESPONSES  CARDIOVASCULAR: Examination of carotid arteries is normal; no carotid bruits Regular rate and rhythm, no murmurs Examination of peripheral vascular system by observation and palpation is normal  EYES: Ophthalmoscopic exam of optic discs and posterior segments is normal; no papilledema or hemorrhages No results found.  MUSCULOSKELETAL: Gait, strength, tone, movements noted in Neurologic exam below  NEUROLOGIC: MENTAL STATUS:  No flowsheet data found. awake, alert, oriented to person, place and time recent and remote memory intact normal attention and concentration language fluent, comprehension intact, naming intact fund of knowledge appropriate  CRANIAL NERVE:  2nd - no papilledema on fundoscopic exam 2nd, 3rd, 4th, 6th - pupils equal and  reactive to light, visual fields full to confrontation, extraocular muscles intact, NYSTAGMUS IN ALL DIRECTIONS 5th - facial sensation symmetric 7th - facial strength symmetric 8th - hearing intact 9th - palate elevates symmetrically, uvula midline 11th - shoulder shrug symmetric 12th - tongue protrusion midline  MOTOR:  normal bulk and tone, full strength in the BUE, BLE  SENSORY:  normal and symmetric to light touch, pinprick, temperature, vibration  COORDINATION:  finger-nose-finger, fine finger movements normal  REFLEXES:  deep tendon reflexes present and symmetric  GAIT/STATION:  narrow based gait; able to walk on toes, heels and tandem; romberg is negative     DIAGNOSTIC DATA (LABS, IMAGING, TESTING) - I reviewed patient records, labs, notes, testing and imaging myself where available.  Lab Results  Component Value Date   WBC 4.6 03/08/2021   HGB 15.9 03/08/2021   HCT 45.4 03/08/2021   MCV 83.5 03/08/2021   PLT 186 03/08/2021      Component Value Date/Time   NA 131 (L) 03/12/2021 0243   K 4.1 03/12/2021 0243   CL 96 (L) 03/12/2021 0243   CO2 22 03/12/2021 0243   GLUCOSE 222 (H) 03/12/2021 0243   BUN 22 03/12/2021 0243   CREATININE 1.82 (H) 03/12/2021 0243   CALCIUM 10.3 03/12/2021 0243   PROT 6.1 (L) 03/07/2021 0102   ALBUMIN 3.7 03/07/2021 0102   AST 15 03/07/2021 0102   ALT 17 03/07/2021 0102   ALKPHOS 83 03/07/2021 0102   BILITOT 0.9 03/07/2021 0102   GFRNONAA 41 (L) 03/12/2021 0243   GFRAA 56 (L) 01/25/2018 0559   Lab Results  Component Value Date   CHOL 152 03/04/2021   HDL 39 (L) 03/04/2021   LDLCALC 77 03/04/2021   TRIG 181 (H) 03/04/2021   CHOLHDL 3.9 03/04/2021   Lab Results  Component Value Date   HGBA1C 13.5 (H) 03/04/2021   No results found for: [id number] Lab Results  Component Value Date   TSH 0.293 (L) 03/08/2021    03/03/21 CTA head / neck 1. No intracranial large vessel occlusion or significant stenosis. 2. No  hemodynamically significant stenosis in the neck. 3. No venous sinus stenosis or thrombosis.   03/07/21 MRI brain [I reviewed images myself and agree with interpretation. -VRP]  1. Motion degraded, incomplete examination. 2. Abnormal cortically based  diffusion signal in the occipital and temporal lobes, greater in extent than on the recent prior MRI and favored to reflect the sequelae of recent seizure activity although a component of acute ischemia is also possible. Subcortical T2 hypointensity in these regions is of indeterminate etiology but has been reported with nonketotic hyperglycemia and occipital seizures as well as ischemia and encephalitis.   03/05/21 TTE  1. Left ventricular ejection fraction, by estimation, is 70 to 75%. The  left ventricle has hyperdynamic function. The left ventricle has no  regional wall motion abnormalities. There is mild left ventricular  hypertrophy. Left ventricular diastolic  parameters were normal.   2. Right ventricular systolic function is normal. The right ventricular  size is normal.   3. The mitral valve is normal in structure. Trivial mitral valve  regurgitation. No evidence of mitral stenosis.   4. The aortic valve is tricuspid. Aortic valve regurgitation is mild. No  aortic stenosis is present.   5. The inferior vena cava is normal in size with greater than 50%  respiratory variability, suggesting right atrial pressure of 3 mmHg.   6. Agitated saline contrast bubble study was negative, with no evidence  of any interatrial shunt.    03/06/21 EEG - This study showed 22 seizure without clinical signs arising from right temporo-parietal region. Average duration 30 seconds. Last seizure was noted on 03/06/2021 at 1537. - Additionally, EEG showed evidence of cortical dysfunction and epileptogenicity arising from right hemisphere, maximal right temporoparietal region likely due to underlying structural abnormality with high potential for  seizure recurrence.   03/13/21 EEG - This study showed cortical dysfunction arising from right hemisphere, maximal right temporoparietal region, nonspecific in etiology but most likely secondary to underlying structural abnormality.  No definite seizures or epileptiform discharges were seen during the study. - EEG appears to be significantly improved compared to previous studies.     ASSESSMENT AND PLAN  65 y.o. year old male here with visual disturbance, headaches, altered mental status, abnormal spells, in the setting of hyperglycemia, electroclinical seizures on long-term monitoring, abnormal MRI brain findings.  Antiseizure medication is controlling his seizures but causing significant side effects.  We will gradually reduce medication.   Dx:  1. Partial symptomatic epilepsy with complex partial seizures, not intractable, with status epilepticus ([place])   2. Type 2 diabetes mellitus with other neurologic complication, with long-term current use of insulin (HCC)       PLAN:  EPILEPSIA PARTIALIS CONTINUA (likely hyperglycemia induced seizures / partial status epilepticus) - continue divalproex 500mg  twice a day  - continue vimpat 200mg  twice a day  - reduce phenobarbital 97.2mg  at bedtime (due to oversedation) - check levels; then plan to wean vimpat next after 4 weeks (may need to stay on depakote and PB longer term) - repeat MRI brain (to follow up prior changes; seizure phenomenon vs ischemic changes)   Orders Placed This Encounter  Procedures   MR BRAIN W WO CONTRAST   Valproic acid level   Phenobarbital level    Meds ordered this encounter  Medications   ALPRAZolam (XANAX) 0.5 MG tablet    Sig: for sedation before MRI scan; take 1 tab 1 hour before scan; may repeat 1 tab 15 min before scan    Dispense:  3 tablet    Refill:  0   divalproex (DEPAKOTE) 500 MG DR tablet    Sig: Take 1 tablet (500 mg total) by mouth 2 (two) times daily.    Dispense:  180 tablet  Refill:  4   lacosamide (VIMPAT) 200 MG TABS tablet    Sig: Take 1 tablet (200 mg total) by mouth 2 (two) times daily.    Dispense:  60 tablet    Refill:  5   PHENobarbital (LUMINAL) 97.2 MG tablet    Sig: Take 1 tablet (97.2 mg total) by mouth at bedtime.    Dispense:  90 tablet    Refill:  4   Return in about 3 months (around 06/27/2021).    Penni Bombard, MD 03/55/9741, 6:38 AM Certified in Neurology, Neurophysiology and Neuroimaging  [facility] [address] [place], [place] [address] [phone]

## 2021-03-30 LAB — VALPROIC ACID LEVEL: Valproic Acid Lvl: 88 ug/mL (ref 50–100)

## 2021-03-30 LAB — PHENOBARBITAL LEVEL: Phenobarbital, Serum: 42 ug/mL (ref 15–40)

## 2021-04-03 ENCOUNTER — Ambulatory Visit
Admission: RE | Admit: 2021-04-03 | Discharge: 2021-04-03 | Disposition: A | Discharge status: Home / Self Care | Payer: Medicare Other | Source: Ambulatory Visit | Attending: Diagnostic Neuroimaging | Admitting: Diagnostic Neuroimaging

## 2021-04-03 DIAGNOSIS — G40201 Localization-related (focal) (partial) symptomatic epilepsy and epileptic syndromes with complex partial seizures, not intractable, with status epilepticus: Secondary | ICD-10-CM

## 2021-04-03 DIAGNOSIS — Z794 Long term (current) use of insulin: Secondary | ICD-10-CM

## 2021-04-03 MED ORDER — GADOBENATE DIMEGLUMINE 529 MG/ML IV SOLN
12.0000 mL | Freq: Once | INTRAVENOUS | Status: AC | PRN
  Administered 2021-04-03: 10:00:00 12 mL via INTRAVENOUS

## 2021-04-17 ENCOUNTER — Telehealth: Payer: Self-pay | Admitting: Diagnostic Neuroimaging

## 2021-04-17 DIAGNOSIS — G40201 Localization-related (focal) (partial) symptomatic epilepsy and epileptic syndromes with complex partial seizures, not intractable, with status epilepticus: Secondary | ICD-10-CM

## 2021-04-17 NOTE — Telephone Encounter (Signed)
Pt's wife [name] called wanting to give report that her husband is doing better. However his medications are still making him very drowsy not as bad as he was when he came in to see [place] back in December, but wife is not sure which medication is making him feel that way. Pt's wife requesting a call back.

## 2021-04-17 NOTE — Telephone Encounter (Signed)
Any suggestions regarding pts drowsiness with medications? Please advise

## 2021-04-19 NOTE — Telephone Encounter (Signed)
Contacted pt wife back, due to pt already being on 200 mg tab 2x daily, Advised pt to reduce to one tab daily. She was appreciative.

## 2021-05-01 MED ORDER — LACOSAMIDE 50 MG PO TABS: 50.0000 mg | ORAL_TABLET | Freq: Two times a day (BID) | ORAL | 5 refills | Status: DC

## 2021-05-01 NOTE — Addendum Note (Signed)
Addended by: Andrey Spearman R on: 05/01/2021 04:18 PM   Modules accepted: Orders

## 2021-05-01 NOTE — Telephone Encounter (Signed)
Currently on vimpat 200mg  daily.   Reduce vimpat to 50mg  twice a day x 2 weeks, then 50mg  daily x 2 weeks, then stop.  Continue divalporex and phenobarbital.  Meds ordered this encounter  Medications   lacosamide (VIMPAT) 50 MG TABS tablet    Sig: Take 1 tablet (50 mg total) by mouth 2 (two) times daily.    Dispense:  60 tablet    Refill:  [place], MD 08/31/8183, 9:09 PM Certified in Neurology, Neurophysiology and [facility] [address] [place], [place] [address] [phone]

## 2021-05-01 NOTE — Telephone Encounter (Signed)
Pt 's wife, Tali Coster reporting that pt doing better staying awake. He sleeps for 2-3 hours when he does sleep. When he is awake he acts like he is high off the medication. Would like a call from the nurse.

## 2021-05-01 NOTE — Telephone Encounter (Signed)
Contacted pt spouse back, states she is giving him Divalproex 500mg  twice daily. Lacosamide 1 tab 200mg  daily and the Phenobarb once a night.  She states he is doing better staying awake but when he is awake, its like he is extremely drowsy/high off the medications. Any other medication adjustments you recommend? Please advise

## 2021-05-02 NOTE — Telephone Encounter (Signed)
Contacted spouse back, informed her dr Leta Baptist wants to taper pt off of vimpat. We will Reduce vimpat to 50mg  twice a day x 2 weeks, then 50mg  daily x 2 weeks, then stop. New rx sent to pharmacy and I will send this to pts mychart as well for reference.

## 2021-06-05 MED ORDER — PHENOBARBITAL 64.8 MG PO TABS: 64.8000 mg | ORAL_TABLET | Freq: Every day | ORAL | 5 refills | Status: DC

## 2021-06-05 NOTE — Telephone Encounter (Signed)
Called wife and advised her of Dr [employer] new Rx of reduced dose of phenobarb, Rx sent to [place]. He is to continue taking divalproex as ordered. I requested she call us in a week to let us know how he is doing. She  verbalized understanding, appreciation.

## 2021-06-05 NOTE — Telephone Encounter (Signed)
Pt's wife, Eziah Negro (on [place]) called with an update pt. He's doing good since coming off the Lacosamide. When he takes divalproex (DEPAKOTE) 500 MG DR tablet he acts high, after an 2 to 3 hour he comes down. He experiences the same the second dose in the evening.  PHENobarbital (LUMINAL) 97.2 MG tablet keeping him awake at night. Would like a call back.

## 2021-06-05 NOTE — Telephone Encounter (Signed)
Called wife who stated she was just giving [place] update on how patient is doing. She stated he's doing well, but still seems like he gets "high" off other medicines, like a person who is drinking, says things he wouldn't normally say from the divalproex. Phenobarb keeps him awake. Otherwise he is "back to normal", trulicty and jardiance are keeping blood sugars down.  I advised will send to Dr Leta Baptist and let her know his recommendations. She  verbalized understanding, appreciation.

## 2021-06-05 NOTE — Telephone Encounter (Signed)
Meds ordered this encounter  Medications   DISCONTD: lacosamide (VIMPAT) 50 MG TABS tablet    Sig: Take 1 tablet (50 mg total) by mouth 2 (two) times daily.    Dispense:  60 tablet    Refill:  5   PHENobarbital (LUMINAL) 64.8 MG tablet    Sig: Take 1 tablet (64.8 mg total) by mouth at bedtime.    Dispense:  60 tablet    Refill:  5   Reduce phenobarbital from 97.2mg  to 64.8mg  at bedtime. Continue divalproex.   Penni Bombard, MD 0/63/4949, 4:47 PM Certified in Neurology, Neurophysiology and Neuroimaging  [facility] [address], [place], [place] [address] [phone]

## 2021-06-05 NOTE — Addendum Note (Signed)
Addended by: Andrey Spearman R on: 06/05/2021 04:00 PM   Modules accepted: Orders

## 2021-06-05 NOTE — Addendum Note (Signed)
Addended by: Minna Antis on: 06/05/2021 10:58 AM   Modules accepted: Orders

## 2021-06-12 NOTE — Telephone Encounter (Addendum)
Pt's wife has called to give a report on how pt is doing with the lowered dosage of :PHENobarbital (LUMINAL) 64.8 MG tablet  ?Wife states pt is well with the lowered dosage.re: the :divalproex (DEPAKOTE) 500 MG DR tablet ?Wife states it;s like it is too much for him, he's in a high state but only for 2-3 hours. Please call wife ?

## 2021-06-12 NOTE — Telephone Encounter (Signed)
LVM advising wife to continue same medications at current doses for next 2-4 weeks; then Dr Leta Baptist will plan to reduce phenobarb again. Repeated the directions and left # for questions. ?

## 2021-06-27 ENCOUNTER — Ambulatory Visit (INDEPENDENT_AMBULATORY_CARE_PROVIDER_SITE_OTHER): Payer: Medicare Other | Admitting: Diagnostic Neuroimaging

## 2021-06-27 ENCOUNTER — Encounter: Payer: Self-pay | Admitting: Diagnostic Neuroimaging

## 2021-06-27 VITALS — BP 135/77 | HR 83 | Ht 68.0 in | Wt 200.0 lb

## 2021-06-27 DIAGNOSIS — G40201 Localization-related (focal) (partial) symptomatic epilepsy and epileptic syndromes with complex partial seizures, not intractable, with status epilepticus: Secondary | ICD-10-CM

## 2021-06-27 DIAGNOSIS — E1149 Type 2 diabetes mellitus with other diabetic neurological complication: Secondary | ICD-10-CM

## 2021-06-27 DIAGNOSIS — Z794 Long term (current) use of insulin: Secondary | ICD-10-CM

## 2021-06-27 MED ORDER — DIVALPROEX SODIUM 250 MG PO DR TAB: 250.0000 mg | DELAYED_RELEASE_TABLET | Freq: Two times a day (BID) | ORAL | 6 refills | Status: AC

## 2021-06-27 NOTE — Patient Instructions (Signed)
EPILEPSIA PARTIALIS CONTINUA (likely hyperglycemia induced seizures / partial status epilepticus) ? ?- reduce divalproex to '250mg'$  twice a day x 2-4 weeks; then '250mg'$  daily x 2-4 weeks; then stop ? ?- continue phenobarbital 64.8 at bedtime; may reduce further in the future after weaned off divalproex ?

## 2021-06-27 NOTE — Progress Notes (Signed)
? ?[facility] ? ?PATIENT: [name] ?DOB: 03-07-1956 ? ?REFERRING CLINICIAN: [name], DO ?HISTORY FROM: patient and wife and son ?REASON FOR VISIT: follow up ? ? ?HISTORICAL ? ?CHIEF COMPLAINT:  ?Chief Complaint  ?Patient presents with  ? Epilepsy  ?  Rm 6, 3 month FU, wife- Almyra Free, son- Heath Lark "still not tolerating seizure meds well; feel drunk and can't sleep at night"  ? ? ?HISTORY OF PRESENT ILLNESS:  ? ?UPDATE (06/27/21, VRP): Since last visit, doing well on lower meds. Some sedation continues. No more seizures. ? ?PRIOR HPI: 66 year old male with diabetes, hypertension, hyperlipidemia, chronic kidney disease, here for evaluation of seizures. ? ?Patient diagnosed with diabetes in 2019.  He has had difficulty controlled diabetes with hemoglobin A1c's ranging from 9-13.  Patient went to emergency room on 01/25/2018 with blood sugars greater than 600. ? ?Patient presented to hospital on 03/02/2021 due to headaches and vision changes.  Patient had significant hyperglycemia on presentation as well as the several weeks leading up to this.  Initially he was evaluated for stroke and second MRI raise possibility of right occipital ischemic infarction.  Over time he had continued visual hallucinations, emotional outburst attacks, which were ultimately diagnosed as epileptic seizures on long-term EEG monitoring.  Antiseizure medications were started.  Eventually escalation of antiseizure medications was able to stop these events using Vimpat, Depakote and phenobarbital.  He was discharged on 03/13/2021. ? ?No further seizure-like events.  However patient has had significant ataxia, fatigue, sedation issues which family is concerned about overmedication. ? ? ? ?REVIEW OF SYSTEMS: Full 14 system review of systems performed and negative with exception of: as per HPI. ? ?ALLERGIES: ?Allergies  ?Allergen Reactions  ? Actos [Pioglitazone] Shortness Of Breath and Swelling  ?  Abdomen swelling  ?  Azithromycin Anaphylaxis and Hives  ? Ciprofloxacin Hives and Anaphylaxis  ? Codeine Nausea And Vomiting  ?  Other reaction(s): Agitation  ? ? ?HOME MEDICATIONS: ?Outpatient Medications Prior to Visit  ?Medication Sig Dispense Refill  ? ALPRAZolam (XANAX) 0.5 MG tablet for sedation before MRI scan; take 1 tab 1 hour before scan; may repeat 1 tab 15 min before scan 3 tablet 0  ? aspirin 81 MG chewable tablet Chew 1 tablet (81 mg total) by mouth daily. 30 tablet 0  ? atorvastatin (LIPITOR) 10 MG tablet Take 10 mg by mouth at bedtime.    ? Dulaglutide (TRULICITY) 3 KY/7.0WC SOPN See admin instructions. Weekly    ? empagliflozin (JARDIANCE) 25 MG TABS tablet Take 25 mg by mouth every morning.    ? Fluorouracil 4 % CREA Apply 1 application. topically 2 (two) times daily.    ? Naphazoline HCl (CLEAR EYES OP) Place 1 drop into both eyes at bedtime as needed (dry eyes).    ? nitroGLYCERIN (NITROSTAT) 0.3 MG SL tablet Place 0.3 mg under the tongue every 5 (five) minutes as needed for chest pain.    ? OVER THE COUNTER MEDICATION Take 1 tablet by mouth once. Over the counter sinus medication    ? pantoprazole (PROTONIX) 40 MG tablet Take 40 mg by mouth every morning.    ? PHENobarbital (LUMINAL) 64.8 MG tablet Take 1 tablet (64.8 mg total) by mouth at bedtime. 60 tablet 5  ? vitamin B-12 (CYANOCOBALAMIN) 500 MCG tablet Take 500 mcg by mouth daily.    ? divalproex (DEPAKOTE) 500 MG DR tablet Take 1 tablet (500 mg total) by mouth 2 (two) times daily. 180 tablet 4  ?  metFORMIN (GLUCOPHAGE) 500 MG tablet Take 1 tablet (500 mg total) by mouth 2 (two) times daily with a meal. (Patient not taking: Reported on 03/29/2021) 60 tablet 0  ? ?No facility-administered medications prior to visit.  ? ? ?PAST MEDICAL HISTORY: ?Past Medical History:  ?Diagnosis Date  ? CKD (chronic kidney disease), stage III ([place])   ? GERD (gastroesophageal reflux disease)   ? Hiatal hernia   ? History of esophageal dilatation   ? 05/ 2004  incompleted ring  ?  Hyperlipidemia associated with type 2 diabetes mellitus ([place])   ? Hypertension associated with diabetes ([place])   ? Mohs defect of cheek 2011  ? rt side/ eye to mandible  ? Occipital infarction ([place])   ? Prostate cancer [facility]) urologist-  dr dahlstedt/  oncologist-  dr Tammi Klippel  ? Stage T1c, Gleason 7, PSA 5.9, vol 27cc  ? Seizures ([place])   ? Skin cancer of face 2011  ? returned 2013-radiation tx   ? Type 2 diabetes mellitus ([place])   ? ? ?PAST SURGICAL HISTORY: ?Past Surgical History:  ?Procedure Laterality Date  ? COLONOSCOPY    ? CYSTOSCOPY N/A 06/29/2016  ? Procedure: CYSTOSCOPY FLEXIBLE;  Surgeon: Franchot Gallo, MD;  Location: [facility];  Service: Urology;  Laterality: N/A;  no seeds found in bladder  ? ESOPHAGOGASTRODUODENOSCOPY (EGD) WITH ESOPHAGEAL DILATION  08/2002  ? HEMORRHOID SURGERY    ? INCISIONAL HERNIA REPAIR N/A 12/07/2015  ? Procedure: LAPAROSCOPIC INCISIONAL HERNIA WITH MESH ;  Surgeon: Fanny Skates, MD;  Location: [facility];  Service: General;  Laterality: N/A;  ? LAPAROSCOPIC CHOLECYSTECTOMY  2010  ? PROSTATE BIOPSY    ? RADIOACTIVE SEED IMPLANT N/A 06/29/2016  ? Procedure: RADIOACTIVE SEED IMPLANT/BRACHYTHERAPY IMPLANT;  Surgeon: Franchot Gallo, MD;  Location: [facility];  Service: Urology;  Laterality: N/A;  86 seeds implanted   ? SKIN GRAFT  2013,2011  ? from neck to face on right side/ skin cancer and tumor  ? ? ?FAMILY HISTORY: ?Family History  ?Problem Relation Age of Onset  ? Cancer Mother   ?     colon  ? Stroke Brother   ? ? ?SOCIAL HISTORY: ?Social History  ? ?Socioeconomic History  ? Marital status: Married  ?  Spouse name: Almyra Free  ? Number of children: 3  ? Years of education: Not on file  ? Highest education level: GED or equivalent  ?Occupational History  ? Not on file  ?Tobacco Use  ? Smoking status: Former  ?  Packs/day: 0.50  ?  Years: 10.00  ?  Pack years: 5.00  ?  Types: Cigarettes  ?  Quit date: 04/09/1978  ?  Years since quitting: 43.2  ? Smokeless  tobacco: Never  ? Tobacco comments:  ?  QUIT SMOKING IN THE 1980'S  ?Substance and Sexual Activity  ? Alcohol use: No  ? Drug use: No  ? Sexual activity: Yes  ?Other Topics Concern  ? Not on file  ?Social History Narrative  ? Lives with wife  ? ?Social Determinants of Health  ? ?Financial Resource Strain: Not on file  ?Food Insecurity: Not on file  ?Transportation Needs: Not on file  ?Physical Activity: Not on file  ?Stress: Not on file  ?Social Connections: Not on file  ?Intimate Partner Violence: Not on file  ? ? ? ?PHYSICAL EXAM ? ?GENERAL EXAM/CONSTITUTIONAL: ?Vitals:  ?Vitals:  ? 06/27/21 1116  ?BP: 135/77  ?Pulse: 83  ?Weight: 200 lb (90.7 kg)  ?Height: 5'  8" (1.727 m)  ? ?Body mass index is 30.41 kg/m?. ?Wt Readings from Last 3 Encounters:  ?06/27/21 200 lb (90.7 kg)  ?03/29/21 181 lb (82.1 kg)  ?03/04/21 190 lb (86.2 kg)  ? ?Patient is in no distress; well developed, nourished and groomed; neck is supple ?SLOW RESPONSES ? ?CARDIOVASCULAR: ?Examination of carotid arteries is normal; no carotid bruits ?Regular rate and rhythm, no murmurs ?Examination of peripheral vascular system by observation and palpation is normal ? ?EYES: ?Ophthalmoscopic exam of optic discs and posterior segments is normal; no papilledema or hemorrhages ?No results found. ? ?MUSCULOSKELETAL: ?Gait, strength, tone, movements noted in Neurologic exam below ? ?NEUROLOGIC: ?MENTAL STATUS:  ?No flowsheet data found. ?awake, alert, oriented to person, place and time ?recent and remote memory intact ?normal attention and concentration ?language fluent, comprehension intact, naming intact ?fund of knowledge appropriate ? ?CRANIAL NERVE:  ?2nd - no papilledema on fundoscopic exam ?2nd, 3rd, 4th, 6th - pupils equal and reactive to light, visual fields full to confrontation, extraocular muscles intact; no nystagmus ?5th - facial sensation symmetric ?7th - facial strength symmetric ?8th - hearing intact ?9th - palate elevates symmetrically, uvula  midline ?11th - shoulder shrug symmetric ?12th - tongue protrusion midline ? ?MOTOR:  ?normal bulk and tone, full strength in the BUE, BLE ? ?SENSORY:  ?normal and symmetric to light touch, temperature, vib

## 2021-07-24 ENCOUNTER — Telehealth: Payer: Self-pay | Admitting: Diagnostic Neuroimaging

## 2021-07-24 NOTE — Telephone Encounter (Signed)
Pt wife Almyra Free, on [place]) called wanted to let Dr. Leta Baptist know he is fully off of the divalproex (DEPAKOTE) 250 MG DR tablet medication.  ?Pt wife requesting updates about slowly getting off the PHENobarbital (LUMINAL) 64.8 MG tablet medication.  ?Would like a call back.  ?

## 2021-07-24 NOTE — Telephone Encounter (Signed)
I called pt's wife back. She sts the pt successfully tapered off Depakote she report he has been off med since 07/22/2021 and doing well. Report over the weekend he was in a good mood and not groggy acting.  ?She wanted to know if the phenobarbital 64.8 mg could be decreased at this point? ? ?The pt is taking 64.8 mg at night daily of the phenobarbital.  ?

## 2021-07-26 MED ORDER — PHENOBARBITAL 64.8 MG PO TABS: 64.8000 mg | ORAL_TABLET | Freq: Every day | ORAL | 3 refills | Status: DC

## 2021-07-26 MED ORDER — PHENOBARBITAL 64.8 MG PO TABS: 64.8000 mg | ORAL_TABLET | Freq: Every day | ORAL | 5 refills | Status: DC

## 2021-07-26 NOTE — Telephone Encounter (Signed)
Penumalli, Earlean Polka, MD  Anda Latina, RN ?Caller: Unspecified (2 days ago,  9:34 AM) ?Wait at least 1 month before considering to reduce phenobarb. -VRP  ? ?I called the pt's wife and relayed message she verbalized understanding and agreement to plan. ? ?She will call back in 1 month and we can proceed from there. She does report rx will be needed. I have sent to pharmacy as requested.  ?

## 2021-07-27 NOTE — Telephone Encounter (Signed)
Meds ordered this encounter  ?Medications  ? DISCONTD: PHENobarbital (LUMINAL) 64.8 MG tablet  ?  Sig: Take 1 tablet (64.8 mg total) by mouth at bedtime.  ?  Dispense:  60 tablet  ?  Refill:  5  ? PHENobarbital (LUMINAL) 64.8 MG tablet  ?  Sig: Take 1 tablet (64.8 mg total) by mouth at bedtime.  ?  Dispense:  30 tablet  ?  Refill:  3  ? ? ?Penni Bombard, MD 4/72/0721, 82:88 AM ?Certified in Neurology, Neurophysiology and Neuroimaging ? ?[facility] ?[address], [address] ?[place], [place] [address] ?[phone] ? ?

## 2021-08-22 NOTE — Telephone Encounter (Signed)
Pt's wife, [place] (on [place]) Dr. Leta Baptist want a report on how he has been doing on weaning him off the PHENobarbital (LUMINAL) 64.8 MG tablet. He's doing fine, has not seen any symptoms since weaning him off the medication ?

## 2021-08-28 NOTE — Telephone Encounter (Signed)
Pt calling back to discuss decreasing the phenobarbital dosage. He has been off the Depakote for 1 month now and has been doing well. Pt was advised in prior notes decreasing phenobarbital would be after 1 month of the Depakote.   Will send to MD for review.

## 2021-08-28 NOTE — Telephone Encounter (Signed)
Wife has called back because she has not heard back from Dr Leta Baptist re: what dose he will drop pt down to so that pt will eventually be weaned off of the PHENobarbital (LUMINAL) 64.8 MG tablet.

## 2021-08-29 MED ORDER — PHENOBARBITAL 32.4 MG PO TABS: 32.4000 mg | ORAL_TABLET | Freq: Every day | ORAL | 5 refills | Status: DC

## 2021-08-29 NOTE — Addendum Note (Signed)
Addended by: Penni Bombard on: 08/29/2021 04:37 PM   Modules accepted: Orders

## 2021-08-29 NOTE — Telephone Encounter (Signed)
Called wife and LVM informing her Dr Leta Baptist discontinued phenobarb 64.8 mg and ordered 32.4 mg take one at bedtime daily. Advised patient take new dose for at least a month, let us know how he is doing. Left # for questions.

## 2021-08-29 NOTE — Telephone Encounter (Signed)
Reduce phenobarb to 32.'4mg'$  daily.   Meds ordered this encounter  Medications   DISCONTD: PHENobarbital (LUMINAL) 64.8 MG tablet    Sig: Take 1 tablet (64.8 mg total) by mouth at bedtime.    Dispense:  60 tablet    Refill:  5   DISCONTD: PHENobarbital (LUMINAL) 64.8 MG tablet    Sig: Take 1 tablet (64.8 mg total) by mouth at bedtime.    Dispense:  30 tablet    Refill:  3   PHENobarbital (LUMINAL) 32.4 MG tablet    Sig: Take 1 tablet (32.4 mg total) by mouth at bedtime.    Dispense:  30 tablet    Refill:  [place], MD 6/38/4665, 9:93 PM Certified in Neurology, Neurophysiology and [facility] [address] [place], [place] [address] [phone]

## 2021-09-25 ENCOUNTER — Encounter: Payer: Self-pay | Admitting: Diagnostic Neuroimaging

## 2021-09-25 NOTE — Telephone Encounter (Signed)
Pt doing well with decrease in Phenobarbital to be able wean off medication.

## 2021-09-27 MED ORDER — PHENOBARBITAL 15 MG PO TABS: 15.0000 mg | ORAL_TABLET | Freq: Every day | ORAL | 5 refills | Status: AC

## 2021-09-27 NOTE — Telephone Encounter (Signed)
Reduce PB to '15mg'$  daily.   Meds ordered this encounter  Medications   PHENobarbital (LUMINAL) 15 MG tablet    Sig: Take 1 tablet (15 mg total) by mouth at bedtime.    Dispense:  30 tablet    Refill:  [place], MD 4/49/7530, 0:51 AM Certified in Neurology, Neurophysiology and [facility] [address] [place], [place] [address] [phone]

## 2021-09-27 NOTE — Telephone Encounter (Signed)
New Rx sent and patient made aware via my chart.

## 2021-10-23 NOTE — Telephone Encounter (Signed)
Take phenobarb '15mg'$  every other day for 1 month then stop. -VRP

## 2022-01-01 ENCOUNTER — Telehealth: Payer: Medicare Other | Admitting: Diagnostic Neuroimaging

## 2022-01-02 ENCOUNTER — Telehealth: Payer: Medicare Other | Admitting: Diagnostic Neuroimaging

## 2023-08-02 ENCOUNTER — Other Ambulatory Visit (HOSPITAL_COMMUNITY): Payer: Self-pay
